# Patient Record
Sex: Female | Born: 1985 | Hispanic: No | Marital: Married | Smoking: Former smoker
Health system: Southern US, Community
[De-identification: ages and names within clinical notes are randomized; demographics above are authoritative.]

## PROBLEM LIST (undated history)

## (undated) DIAGNOSIS — G4700 Insomnia, unspecified: Secondary | ICD-10-CM

## (undated) DIAGNOSIS — F419 Anxiety disorder, unspecified: Secondary | ICD-10-CM

## (undated) DIAGNOSIS — F319 Bipolar disorder, unspecified: Secondary | ICD-10-CM

## (undated) DIAGNOSIS — L732 Hidradenitis suppurativa: Secondary | ICD-10-CM

## (undated) DIAGNOSIS — Z8659 Personal history of other mental and behavioral disorders: Secondary | ICD-10-CM

## (undated) DIAGNOSIS — F32A Depression, unspecified: Secondary | ICD-10-CM

## (undated) DIAGNOSIS — R110 Nausea: Secondary | ICD-10-CM

## (undated) DIAGNOSIS — N83209 Unspecified ovarian cyst, unspecified side: Secondary | ICD-10-CM

## (undated) DIAGNOSIS — F410 Panic disorder [episodic paroxysmal anxiety] without agoraphobia: Secondary | ICD-10-CM

## (undated) DIAGNOSIS — L0291 Cutaneous abscess, unspecified: Secondary | ICD-10-CM

## (undated) DIAGNOSIS — T7840XA Allergy, unspecified, initial encounter: Secondary | ICD-10-CM

## (undated) DIAGNOSIS — F329 Major depressive disorder, single episode, unspecified: Secondary | ICD-10-CM

## (undated) DIAGNOSIS — Z973 Presence of spectacles and contact lenses: Secondary | ICD-10-CM

## (undated) DIAGNOSIS — F17210 Nicotine dependence, cigarettes, uncomplicated: Secondary | ICD-10-CM

## (undated) DIAGNOSIS — D649 Anemia, unspecified: Secondary | ICD-10-CM

## (undated) DIAGNOSIS — Z872 Personal history of diseases of the skin and subcutaneous tissue: Secondary | ICD-10-CM

## (undated) HISTORY — PX: FOOT SURGERY: SHX648

## (undated) HISTORY — DX: Anemia, unspecified: D64.9

## (undated) HISTORY — DX: Allergy, unspecified, initial encounter: T78.40XA

## (undated) HISTORY — DX: Depression, unspecified: F32.A

## (undated) HISTORY — DX: Nicotine dependence, cigarettes, uncomplicated: F17.210

## (undated) HISTORY — PX: WISDOM TOOTH EXTRACTION: SHX21

## (undated) HISTORY — DX: Insomnia, unspecified: G47.00

## (undated) HISTORY — DX: Anxiety disorder, unspecified: F41.9

## (undated) HISTORY — PX: CHOLECYSTECTOMY: SHX55

## (undated) HISTORY — DX: Major depressive disorder, single episode, unspecified: F32.9

---
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
## 2007-03-20 HISTORY — PX: TUBAL LIGATION: SHX77

## 2008-05-30 ENCOUNTER — Emergency Department (HOSPITAL_COMMUNITY): Admission: EM | Admit: 2008-05-30 | Discharge: 2008-05-30 | Payer: Self-pay | Admitting: Emergency Medicine

## 2008-07-19 DIAGNOSIS — L259 Unspecified contact dermatitis, unspecified cause: Secondary | ICD-10-CM | POA: Insufficient documentation

## 2008-07-19 DIAGNOSIS — B009 Herpesviral infection, unspecified: Secondary | ICD-10-CM | POA: Insufficient documentation

## 2008-07-19 HISTORY — DX: Herpesviral infection, unspecified: B00.9

## 2009-02-04 DIAGNOSIS — N644 Mastodynia: Secondary | ICD-10-CM | POA: Insufficient documentation

## 2009-02-04 DIAGNOSIS — N62 Hypertrophy of breast: Secondary | ICD-10-CM | POA: Insufficient documentation

## 2009-03-19 ENCOUNTER — Emergency Department (HOSPITAL_COMMUNITY): Admission: EM | Admit: 2009-03-19 | Discharge: 2009-03-19 | Payer: Self-pay | Admitting: Emergency Medicine

## 2009-07-26 ENCOUNTER — Emergency Department (HOSPITAL_COMMUNITY): Admission: EM | Admit: 2009-07-26 | Discharge: 2009-07-27 | Payer: Self-pay | Admitting: Emergency Medicine

## 2009-08-26 ENCOUNTER — Emergency Department (HOSPITAL_COMMUNITY): Admission: EM | Admit: 2009-08-26 | Discharge: 2009-08-26 | Payer: Self-pay | Admitting: Emergency Medicine

## 2009-11-22 ENCOUNTER — Emergency Department (HOSPITAL_COMMUNITY): Admission: EM | Admit: 2009-11-22 | Discharge: 2009-11-22 | Payer: Self-pay | Admitting: Emergency Medicine

## 2010-01-05 DIAGNOSIS — E559 Vitamin D deficiency, unspecified: Secondary | ICD-10-CM | POA: Insufficient documentation

## 2010-05-15 LAB — URINALYSIS, ROUTINE W REFLEX MICROSCOPIC
Bilirubin Urine: NEGATIVE
Nitrite: POSITIVE — AB
Specific Gravity, Urine: 1.026 (ref 1.005–1.030)
Urobilinogen, UA: 1 mg/dL (ref 0.0–1.0)
pH: 5.5 (ref 5.0–8.0)

## 2010-05-15 LAB — URINE MICROSCOPIC-ADD ON

## 2010-05-15 LAB — POCT PREGNANCY, URINE: Preg Test, Ur: NEGATIVE

## 2010-05-19 LAB — WET PREP, GENITAL: Trich, Wet Prep: NONE SEEN

## 2010-05-19 LAB — URINE MICROSCOPIC-ADD ON

## 2010-05-19 LAB — URINALYSIS, ROUTINE W REFLEX MICROSCOPIC
Bilirubin Urine: NEGATIVE
Glucose, UA: NEGATIVE mg/dL
Ketones, ur: NEGATIVE mg/dL
Nitrite: NEGATIVE
Specific Gravity, Urine: 1.025 (ref 1.005–1.030)
pH: 5.5 (ref 5.0–8.0)

## 2010-05-19 LAB — GC/CHLAMYDIA PROBE AMP, GENITAL
Chlamydia, DNA Probe: NEGATIVE
GC Probe Amp, Genital: NEGATIVE

## 2010-07-08 ENCOUNTER — Emergency Department (HOSPITAL_COMMUNITY)
Admission: EM | Admit: 2010-07-08 | Discharge: 2010-07-08 | Discharge status: Against Medical Advice | Payer: Medicaid Other | Source: Home / Self Care | Attending: Emergency Medicine | Admitting: Emergency Medicine

## 2010-07-08 ENCOUNTER — Emergency Department (HOSPITAL_COMMUNITY)
Admission: EM | Admit: 2010-07-08 | Discharge: 2010-07-08 | Disposition: A | Discharge status: Home / Self Care | Payer: Medicaid Other | Attending: Emergency Medicine | Admitting: Emergency Medicine

## 2010-07-08 ENCOUNTER — Emergency Department (HOSPITAL_COMMUNITY): Payer: Medicaid Other

## 2010-07-08 DIAGNOSIS — M79609 Pain in unspecified limb: Secondary | ICD-10-CM | POA: Insufficient documentation

## 2010-07-08 DIAGNOSIS — M7989 Other specified soft tissue disorders: Secondary | ICD-10-CM | POA: Insufficient documentation

## 2010-07-08 DIAGNOSIS — Y990 Civilian activity done for income or pay: Secondary | ICD-10-CM | POA: Insufficient documentation

## 2010-07-08 DIAGNOSIS — W19XXXA Unspecified fall, initial encounter: Secondary | ICD-10-CM | POA: Insufficient documentation

## 2010-07-08 DIAGNOSIS — S59909A Unspecified injury of unspecified elbow, initial encounter: Secondary | ICD-10-CM | POA: Insufficient documentation

## 2010-07-08 DIAGNOSIS — S59919A Unspecified injury of unspecified forearm, initial encounter: Secondary | ICD-10-CM | POA: Insufficient documentation

## 2010-07-08 DIAGNOSIS — S6990XA Unspecified injury of unspecified wrist, hand and finger(s), initial encounter: Secondary | ICD-10-CM | POA: Insufficient documentation

## 2011-05-25 ENCOUNTER — Encounter (HOSPITAL_COMMUNITY): Payer: Self-pay | Admitting: Emergency Medicine

## 2011-05-25 ENCOUNTER — Emergency Department (HOSPITAL_COMMUNITY)
Admission: EM | Admit: 2011-05-25 | Discharge: 2011-05-25 | Disposition: A | Discharge status: Home / Self Care | Payer: Medicaid Other | Attending: Emergency Medicine | Admitting: Emergency Medicine

## 2011-05-25 DIAGNOSIS — R109 Unspecified abdominal pain: Secondary | ICD-10-CM | POA: Insufficient documentation

## 2011-05-25 DIAGNOSIS — A088 Other specified intestinal infections: Secondary | ICD-10-CM | POA: Insufficient documentation

## 2011-05-25 DIAGNOSIS — A084 Viral intestinal infection, unspecified: Secondary | ICD-10-CM

## 2011-05-25 DIAGNOSIS — R197 Diarrhea, unspecified: Secondary | ICD-10-CM | POA: Insufficient documentation

## 2011-05-25 DIAGNOSIS — Z79899 Other long term (current) drug therapy: Secondary | ICD-10-CM | POA: Insufficient documentation

## 2011-05-25 DIAGNOSIS — R5381 Other malaise: Secondary | ICD-10-CM | POA: Insufficient documentation

## 2011-05-25 DIAGNOSIS — R509 Fever, unspecified: Secondary | ICD-10-CM | POA: Insufficient documentation

## 2011-05-25 DIAGNOSIS — J45909 Unspecified asthma, uncomplicated: Secondary | ICD-10-CM | POA: Insufficient documentation

## 2011-05-25 DIAGNOSIS — R112 Nausea with vomiting, unspecified: Secondary | ICD-10-CM | POA: Insufficient documentation

## 2011-05-25 LAB — COMPREHENSIVE METABOLIC PANEL
Albumin: 4.6 g/dL (ref 3.5–5.2)
CO2: 22 mEq/L (ref 19–32)
Calcium: 10 mg/dL (ref 8.4–10.5)
Chloride: 102 mEq/L (ref 96–112)
GFR calc Af Amer: >90 mL/min (ref >90)
Glucose, Bld: 92 mg/dL (ref 70–99)
Sodium: 137 mEq/L (ref 135–145)
Total Protein: 8.2 g/dL (ref 6.0–8.3)

## 2011-05-25 LAB — DIFFERENTIAL
Basophils Relative: 0 % (ref 0–1)
Eosinophils Absolute: 0 10*3/uL (ref 0.0–0.7)
Eosinophils Relative: 0 % (ref 0–5)
Monocytes Absolute: 0.4 10*3/uL (ref 0.1–1.0)
Neutro Abs: 13.9 10*3/uL — ABNORMAL HIGH (ref 1.7–7.7)
Neutrophils Relative %: 95 % — ABNORMAL HIGH (ref 43–77)

## 2011-05-25 LAB — URINALYSIS, ROUTINE W REFLEX MICROSCOPIC
Leukocytes, UA: NEGATIVE
Nitrite: NEGATIVE
Protein, ur: NEGATIVE mg/dL
Urobilinogen, UA: 1 mg/dL (ref 0.0–1.0)

## 2011-05-25 LAB — CBC
HCT: 44.6 % (ref 36.0–46.0)
Hemoglobin: 15.3 g/dL — ABNORMAL HIGH (ref 12.0–15.0)
MCH: 31 pg (ref 26.0–34.0)
MCV: 90.3 fL (ref 78.0–100.0)
RBC: 4.94 MIL/uL (ref 3.87–5.11)
WBC: 14.7 10*3/uL — ABNORMAL HIGH (ref 4.0–10.5)

## 2011-05-25 LAB — GLUCOSE, CAPILLARY: Glucose-Capillary: 82 mg/dL (ref 70–99)

## 2011-05-25 LAB — URINE MICROSCOPIC-ADD ON

## 2011-05-25 LAB — LIPASE, BLOOD: Lipase: 22 U/L (ref 11–59)

## 2011-05-25 MED ORDER — PROMETHAZINE HCL 25 MG PO TABS: 25.0000 mg | ORAL_TABLET | Freq: Four times a day (QID) | ORAL | Status: DC | PRN

## 2011-05-25 MED ORDER — MORPHINE SULFATE 4 MG/ML IJ SOLN
4.0000 mg | Freq: Once | INTRAMUSCULAR | Status: AC
  Administered 2011-05-25: 4 mg via INTRAVENOUS
  Filled 2011-05-25: qty 1

## 2011-05-25 MED ORDER — SODIUM CHLORIDE 0.9 % IV BOLUS (SEPSIS): 1000.0000 mL | Freq: Once | INTRAVENOUS | Status: DC

## 2011-05-25 MED ORDER — ONDANSETRON HCL 4 MG/2ML IJ SOLN
8.0000 mg | Freq: Once | INTRAMUSCULAR | Status: AC
  Administered 2011-05-25: 4 mg via INTRAVENOUS
  Filled 2011-05-25: qty 2

## 2011-05-25 MED ORDER — METOCLOPRAMIDE HCL 5 MG/ML IJ SOLN
10.0000 mg | Freq: Once | INTRAMUSCULAR | Status: AC
  Administered 2011-05-25: 10 mg via INTRAVENOUS
  Filled 2011-05-25: qty 2

## 2011-05-25 MED ORDER — LOPERAMIDE HCL 2 MG PO CAPS: 2.0000 mg | ORAL_CAPSULE | Freq: Four times a day (QID) | ORAL | Status: AC | PRN

## 2011-05-25 NOTE — ED Notes (Signed)
Pt in by [facility] c/o of nausea, vomiting, diarrhea, chills, abdominal pain, headache x 2 days. Pt unable to keep down food. CBG 68. BP 110/70, HR 72.

## 2011-05-25 NOTE — Discharge Instructions (Signed)
Viral Gastroenteritis Viral gastroenteritis is also known as stomach flu. This condition affects the stomach and intestinal tract. It can cause sudden diarrhea and vomiting. The illness typically lasts 3 to 8 days. Most people develop an immune response that eventually gets rid of the virus. While this natural response develops, the virus can make you quite ill. CAUSES  Many different viruses can cause gastroenteritis, such as rotavirus or noroviruses. You can catch one of these viruses by consuming contaminated food or water. You may also catch a virus by sharing utensils or other personal items with an infected person or by touching a contaminated surface. SYMPTOMS  The most common symptoms are diarrhea and vomiting. These problems can cause a severe loss of body fluids (dehydration) and a body salt (electrolyte) imbalance. Other symptoms may include:  Fever.   Headache.   Fatigue.   Abdominal pain.  DIAGNOSIS  Your caregiver can usually diagnose viral gastroenteritis based on your symptoms and a physical exam. A stool sample may also be taken to test for the presence of viruses or other infections. TREATMENT  This illness typically goes away on its own. Treatments are aimed at rehydration. The most serious cases of viral gastroenteritis involve vomiting so severely that you are not able to keep fluids down. In these cases, fluids must be given through an intravenous line (IV). HOME CARE INSTRUCTIONS   Drink enough fluids to keep your urine clear or pale yellow. Drink small amounts of fluids frequently and increase the amounts as tolerated.   Ask your caregiver for specific rehydration instructions.   Avoid:   Foods high in sugar.   Alcohol.   Carbonated drinks.   Tobacco.   Juice.   Caffeine drinks.   Extremely hot or cold fluids.   Fatty, greasy foods.   Too much intake of anything at one time.   Dairy products until 24 to 48 hours after diarrhea stops.   You may  consume probiotics. Probiotics are active cultures of beneficial bacteria. They may lessen the amount and number of diarrheal stools in adults. Probiotics can be found in yogurt with active cultures and in supplements.   Wash your hands well to avoid spreading the virus.   Only take over-the-counter or prescription medicines for pain, discomfort, or fever as directed by your caregiver. Do not give aspirin to children. Antidiarrheal medicines are not recommended.   Ask your caregiver if you should continue to take your regular prescribed and over-the-counter medicines.   Keep all follow-up appointments as directed by your caregiver.  SEEK IMMEDIATE MEDICAL CARE IF:   You are unable to keep fluids down.   You do not urinate at least once every 6 to 8 hours.   You develop shortness of breath.   You notice blood in your stool or vomit. This may look like coffee grounds.   You have abdominal pain that increases or is concentrated in one small area (localized).   You have persistent vomiting or diarrhea.   You have a fever.   The patient is a child younger than 3 months, and he or she has a fever.   The patient is a child older than 3 months, and he or she has a fever and persistent symptoms.   The patient is a child older than 3 months, and he or she has a fever and symptoms suddenly get worse.   The patient is a baby, and he or she has no tears when crying.  MAKE SURE YOU:     Understand these instructions.   Will watch your condition.   Will get help right away if you are not doing well or get worse.  Document Released: 02/16/2005 Document Revised: 02/05/2011 Document Reviewed: 12/03/2010 [employer] Patient Information 2012 [employer]. 

## 2011-05-25 NOTE — ED Provider Notes (Signed)
History     CSN: [id number]  Arrival date & time 05/25/11  1004   First MD Initiated Contact with Patient 05/25/11 1019      Chief Complaint  Patient presents with  . Nausea  . Emesis    (Consider location/radiation/quality/duration/timing/severity/associated sxs/prior treatment) Patient is a 26 y.o. female presenting with vomiting. The history is provided by the patient. No language interpreter was used.  Emesis  This is a new problem. The current episode started 2 days ago. The problem occurs 5 to 10 times per day. The problem has been gradually worsening. The emesis has an appearance of stomach contents. Maximum temperature: subjective. Associated symptoms include abdominal pain, chills (subjective), diarrhea and a fever (subjective). Pertinent negatives include no arthralgias, no cough, no headaches and no myalgias. Risk factors include ill contacts (her son has been ill with similar sx).    Past Medical History  Diagnosis Date  . Asthma   . Migraine     Past Surgical History  Procedure Date  . Tubal ligation   . Cesarean section     No family history on file.  History  Substance Use Topics  . Smoking status: Not on file  . Smokeless tobacco: Not on file  . Alcohol Use:     OB History    Grav Para Term Preterm Abortions TAB SAB Ect Mult Living                  Review of Systems  Constitutional: Positive for fever (subjective), chills (subjective), activity change, appetite change and fatigue.  HENT: Negative for congestion, sore throat, rhinorrhea, neck pain and neck stiffness.   Respiratory: Negative for cough and shortness of breath.   Cardiovascular: Negative for chest pain and palpitations.  Gastrointestinal: Positive for nausea, vomiting, abdominal pain and diarrhea. Negative for constipation and blood in stool.  Genitourinary: Negative for dysuria, urgency, frequency and flank pain.  Musculoskeletal: Negative for myalgias, back pain and arthralgias.    Neurological: Negative for dizziness, weakness, light-headedness, numbness and headaches.  All other systems reviewed and are negative.    Allergies  Ampicillin and Latex  Home Medications   Current Outpatient Rx  Name Route Sig Dispense Refill  . BISMUTH SUBSALICYLATE 262 MG PO CHEW Oral Chew 524 mg by mouth as needed. For diarrhea    . NYQUIL COLD & FLU PO Oral Take 15 mLs by mouth every 12 (twelve) hours.     Marland Kitchen LOPERAMIDE HCL 2 MG PO CAPS Oral Take 1 capsule (2 mg total) by mouth 4 (four) times daily as needed for diarrhea or loose stools. 12 capsule 0  . PROMETHAZINE HCL 25 MG PO TABS Oral Take 1 tablet (25 mg total) by mouth every 6 (six) hours as needed for nausea. 20 tablet 0    BP 111/64  Pulse 85  Temp(Src) 98.7 F (37.1 C) (Oral)  Resp 18  SpO2 100%  Physical Exam  Nursing note and vitals reviewed. Constitutional: She is oriented to person, place, and time. She appears well-developed and well-nourished.  HENT:  Head: Normocephalic and atraumatic.  Mouth/Throat: Oropharynx is clear and moist.  Eyes: Conjunctivae and EOM are normal. Pupils are equal, round, and reactive to light.  Neck: Normal range of motion. Neck supple.  Cardiovascular: Normal rate, regular rhythm, normal heart sounds and intact distal pulses.  Exam reveals no gallop and no friction rub.   No murmur heard. Pulmonary/Chest: Effort normal and breath sounds normal. No respiratory distress. She exhibits no tenderness.  Abdominal: Soft. Bowel sounds are normal. There is tenderness. There is no rebound and no guarding.  Musculoskeletal: Normal range of motion. She exhibits no edema and no tenderness.  Neurological: She is alert and oriented to person, place, and time. No cranial nerve deficit.  Skin: Skin is warm and dry.    ED Course  Procedures (including critical care time)  Labs Reviewed  CBC - Abnormal; Notable for the following:    WBC 14.7 (*)    Hemoglobin 15.3 (*)    All other  components within normal limits  DIFFERENTIAL - Abnormal; Notable for the following:    Neutrophils Relative 95 (*)    Neutro Abs 13.9 (*)    Lymphocytes Relative 3 (*)    Lymphs Abs 0.4 (*)    All other components within normal limits  URINALYSIS, ROUTINE W REFLEX MICROSCOPIC - Abnormal; Notable for the following:    APPearance CLOUDY (*)    Hgb urine dipstick TRACE (*)    Ketones, ur TRACE (*)    All other components within normal limits  URINE MICROSCOPIC-ADD ON - Abnormal; Notable for the following:    Squamous Epithelial / LPF FEW (*)    Bacteria, UA FEW (*)    All other components within normal limits  COMPREHENSIVE METABOLIC PANEL  LIPASE, BLOOD  GLUCOSE, CAPILLARY   No results found.   1. Viral gastroenteritis       MDM  Viral gastroenteritis. The patient has some evidence of dehydration on arrival. She received Zofran, Reglan for anti-emesis. She received a dose of morphine for pain control. After 2 L of fluid the patient states she's feeling much better. She tolerated oral intake one emergency department. Will be discharged home with antiemetics and instructed to follow up with her primary care physician. I encouraged her to continue aggressive oral hydration at home. Was provided clear signs and symptoms for which to return        Dayton Bailiff, MD 05/25/11 [phone]

## 2011-05-25 NOTE — ED Notes (Signed)
CBG 82 

## 2011-05-25 NOTE — ED Notes (Signed)
Pt. Stated unable to urinate  

## 2012-03-25 ENCOUNTER — Encounter (HOSPITAL_COMMUNITY): Payer: Self-pay | Admitting: *Deleted

## 2012-03-25 ENCOUNTER — Emergency Department (HOSPITAL_COMMUNITY)
Admission: EM | Admit: 2012-03-25 | Discharge: 2012-03-25 | Disposition: A | Discharge status: Home / Self Care | Payer: Medicaid Other | Attending: Emergency Medicine | Admitting: Emergency Medicine

## 2012-03-25 DIAGNOSIS — Z8679 Personal history of other diseases of the circulatory system: Secondary | ICD-10-CM | POA: Insufficient documentation

## 2012-03-25 DIAGNOSIS — R51 Headache: Secondary | ICD-10-CM

## 2012-03-25 DIAGNOSIS — F17200 Nicotine dependence, unspecified, uncomplicated: Secondary | ICD-10-CM | POA: Insufficient documentation

## 2012-03-25 DIAGNOSIS — K529 Noninfective gastroenteritis and colitis, unspecified: Secondary | ICD-10-CM

## 2012-03-25 DIAGNOSIS — J45909 Unspecified asthma, uncomplicated: Secondary | ICD-10-CM | POA: Insufficient documentation

## 2012-03-25 DIAGNOSIS — K5289 Other specified noninfective gastroenteritis and colitis: Secondary | ICD-10-CM | POA: Insufficient documentation

## 2012-03-25 DIAGNOSIS — Z79899 Other long term (current) drug therapy: Secondary | ICD-10-CM | POA: Insufficient documentation

## 2012-03-25 MED ORDER — METOCLOPRAMIDE HCL 5 MG/ML IJ SOLN
10.0000 mg | Freq: Once | INTRAMUSCULAR | Status: AC
  Administered 2012-03-25: 10 mg via INTRAVENOUS
  Filled 2012-03-25: qty 2

## 2012-03-25 MED ORDER — SODIUM CHLORIDE 0.9 % IV BOLUS (SEPSIS)
1000.0000 mL | Freq: Once | INTRAVENOUS | Status: AC
  Administered 2012-03-25: 1000 mL via INTRAVENOUS

## 2012-03-25 MED ORDER — PROMETHAZINE HCL 25 MG PO TABS
25.0000 mg | ORAL_TABLET | Freq: Four times a day (QID) | ORAL | Status: DC | PRN
Start: 2012-03-25 — End: 2013-02-13

## 2012-03-25 MED ORDER — ONDANSETRON HCL 4 MG/2ML IJ SOLN
4.0000 mg | Freq: Once | INTRAMUSCULAR | Status: AC
  Administered 2012-03-25: 4 mg via INTRAVENOUS
  Filled 2012-03-25: qty 2

## 2012-03-25 MED ORDER — OXYCODONE-ACETAMINOPHEN 5-325 MG PO TABS: 2.0000 | ORAL_TABLET | ORAL | Status: DC | PRN

## 2012-03-25 MED ORDER — KETOROLAC TROMETHAMINE 30 MG/ML IJ SOLN
30.0000 mg | Freq: Once | INTRAMUSCULAR | Status: AC
  Administered 2012-03-25: 30 mg via INTRAVENOUS
  Filled 2012-03-25: qty 1

## 2012-03-25 NOTE — ED Provider Notes (Addendum)
History     CSN: [id number]  Arrival date & time 03/25/12  0821   First MD Initiated Contact with Patient 03/25/12 [phone]      Chief Complaint  Patient presents with  . Emesis  . Diarrhea    (Consider location/radiation/quality/duration/timing/severity/associated sxs/prior treatment) HPI.... nausea, vomiting, diarrhea since 1 AM with associated right temporal headache.  Minimal epigastric pain. No fever, chills, stiff neck. Patient has tried Aleve with minimal success.  Severity is mild to moderate  Past Medical History  Diagnosis Date  . Asthma   . Migraine     Past Surgical History  Procedure Date  . Tubal ligation   . Cesarean section     No family history on file.  History  Substance Use Topics  . Smoking status: Current Every Day Smoker  . Smokeless tobacco: Not on file  . Alcohol Use: No    OB History    Grav Para Term Preterm Abortions TAB SAB Ect Mult Living                  Review of Systems  All other systems reviewed and are negative.    Allergies  Ampicillin and Latex  Home Medications   Current Outpatient Rx  Name  Route  Sig  Dispense  Refill  . ALBUTEROL SULFATE HFA 108 (90 BASE) MCG/ACT IN AERS   Inhalation   Inhale 2 puffs into the lungs every 6 (six) hours as needed. For shortness of breath.         Marland Kitchen BISMUTH SUBSALICYLATE 262 MG PO CHEW   Oral   Chew 524 mg by mouth as needed. For diarrhea         . LOPERAMIDE HCL 2 MG PO CAPS   Oral   Take 2 mg by mouth 4 (four) times daily as needed. For diarrhea.         Ronney Asters SINUS MAX ST PO   Oral   Take 2 tablets by mouth every 6 (six) hours as needed. For headache.           BP 117/74  Pulse 86  Temp 98.5 F (36.9 C) (Oral)  Resp 24  Ht 5\' 1"  (1.549 m)  Wt 115 lb (52.164 kg)  BMI 21.73 kg/m2  SpO2 100%  LMP 03/24/2012  Physical Exam  Nursing note and vitals reviewed. Constitutional: She is oriented to person, place, and time. She appears well-developed and  well-nourished.  HENT:  Head: Normocephalic and atraumatic.  Eyes: Conjunctivae normal and EOM are normal. Pupils are equal, round, and reactive to light.  Neck: Normal range of motion. Neck supple.  Cardiovascular: Normal rate, regular rhythm and normal heart sounds.   Pulmonary/Chest: Effort normal and breath sounds normal.  Abdominal: Soft. Bowel sounds are normal.  Musculoskeletal: Normal range of motion.  Neurological: She is alert and oriented to person, place, and time.  Skin: Skin is warm and dry.  Psychiatric: She has a normal mood and affect.    ED Course  Procedures (including critical care time)  Labs Reviewed - No data to display No results found.   No diagnosis found.    MDM  No acute abdomen. Hydrate, IV Toradol, IV Reglan.    1145:  Feeling better.  No acute abdomen. Headache improved      Donnetta Hutching, MD 03/25/12 1021  Donnetta Hutching, MD 03/25/12 1201

## 2012-03-25 NOTE — ED Notes (Signed)
Pt reports onset of n/v/d at 0100 this morning. Reports epigastric pain since this morning. Reports taking Imodium, pepto bismol, and tylenol sinus without relief of symptoms.

## 2012-07-13 DIAGNOSIS — G43829 Menstrual migraine, not intractable, without status migrainosus: Secondary | ICD-10-CM | POA: Insufficient documentation

## 2012-07-13 DIAGNOSIS — J309 Allergic rhinitis, unspecified: Secondary | ICD-10-CM | POA: Insufficient documentation

## 2012-12-22 ENCOUNTER — Encounter: Payer: Self-pay | Admitting: Obstetrics

## 2013-01-09 ENCOUNTER — Ambulatory Visit: Payer: Medicaid Other | Admitting: Obstetrics

## 2013-01-13 ENCOUNTER — Ambulatory Visit: Payer: Medicaid Other | Admitting: Obstetrics

## 2013-02-06 ENCOUNTER — Encounter: Payer: Self-pay | Admitting: Obstetrics

## 2013-02-13 ENCOUNTER — Encounter: Payer: Self-pay | Admitting: Obstetrics

## 2013-02-13 ENCOUNTER — Ambulatory Visit (INDEPENDENT_AMBULATORY_CARE_PROVIDER_SITE_OTHER): Payer: Medicaid Other | Admitting: Obstetrics

## 2013-02-13 VITALS — BP 109/70 | HR 57 | Temp 98.4°F | Ht 61.0 in | Wt 140.0 lb

## 2013-02-13 DIAGNOSIS — N926 Irregular menstruation, unspecified: Secondary | ICD-10-CM

## 2013-02-13 DIAGNOSIS — N920 Excessive and frequent menstruation with regular cycle: Secondary | ICD-10-CM

## 2013-02-13 DIAGNOSIS — R82998 Other abnormal findings in urine: Secondary | ICD-10-CM

## 2013-02-13 DIAGNOSIS — Z3049 Encounter for surveillance of other contraceptives: Secondary | ICD-10-CM

## 2013-02-13 DIAGNOSIS — R8290 Unspecified abnormal findings in urine: Secondary | ICD-10-CM

## 2013-02-13 LAB — POCT URINALYSIS DIPSTICK
Bilirubin, UA: NEGATIVE
Glucose, UA: NEGATIVE
Nitrite, UA: POSITIVE
Urobilinogen, UA: NEGATIVE
pH, UA: 5

## 2013-02-13 MED ORDER — SULFAMETHOXAZOLE-TMP DS 800-160 MG PO TABS: 1.0000 | ORAL_TABLET | Freq: Two times a day (BID) | ORAL | Status: DC

## 2013-02-13 MED ORDER — NORETHINDRONE ACETATE 5 MG PO TABS: 10.0000 mg | ORAL_TABLET | Freq: Every day | ORAL | Status: DC

## 2013-02-13 NOTE — Progress Notes (Signed)
Subjective:     Diane Garrison is a 27 y.o. female here for a problem exam.  Current complaints: prolonged menstrual bleeding, has not had a depo shot since July, complains that bleeds even with depo shot  Personal health questionnaire reviewed: yes.   Gynecologic History Patient's last menstrual period was 01/27/2013. Contraception: Depo-Provera injections Last Pap:2013. Results were: normal Last mammogram: N/A  Obstetric History OB History  No data available     The following portions of the patient's history were reviewed and updated as appropriate: allergies, current medications, past family history, past medical history, past social history, past surgical history and problem list.  Review of Systems Pertinent items are noted in HPI.    Objective:    General appearance: alert and no distress Abdomen: normal findings: soft, non-tender Pelvic: cervix normal in appearance, external genitalia normal, no adnexal masses or tenderness, no cervical motion tenderness, rectovaginal septum normal, uterus normal size, shape, and consistency and vagina with malodorous pinkish discharge.    Assessment:    AUB on Depo.  BV  UTI   Plan:    Education reviewed: safe sex/STD prevention and contraceptive options. Contraception: none. Follow up in: 6 weeks. Aygestin Rx.   Bactrim DS Rx

## 2013-02-14 LAB — WET PREP BY MOLECULAR PROBE
Gardnerella vaginalis: POSITIVE — AB
Trichomonas vaginosis: NEGATIVE

## 2013-02-14 LAB — GC/CHLAMYDIA PROBE AMP: GC Probe RNA: NEGATIVE

## 2013-02-16 ENCOUNTER — Other Ambulatory Visit: Payer: Self-pay | Admitting: *Deleted

## 2013-02-16 DIAGNOSIS — N390 Urinary tract infection, site not specified: Secondary | ICD-10-CM

## 2013-02-16 DIAGNOSIS — B9689 Other specified bacterial agents as the cause of diseases classified elsewhere: Secondary | ICD-10-CM

## 2013-02-16 MED ORDER — METRONIDAZOLE 500 MG PO TABS: 500.0000 mg | ORAL_TABLET | Freq: Two times a day (BID) | ORAL | Status: DC

## 2013-02-16 MED ORDER — NITROFURANTOIN MONOHYD MACRO 100 MG PO CAPS: 100.0000 mg | ORAL_CAPSULE | Freq: Two times a day (BID) | ORAL | Status: DC

## 2013-03-27 ENCOUNTER — Ambulatory Visit: Payer: Medicaid Other | Admitting: Obstetrics

## 2013-03-30 ENCOUNTER — Ambulatory Visit: Payer: Medicaid Other | Admitting: Obstetrics

## 2013-03-31 ENCOUNTER — Ambulatory Visit (INDEPENDENT_AMBULATORY_CARE_PROVIDER_SITE_OTHER): Payer: Medicaid Other | Admitting: Obstetrics

## 2013-03-31 ENCOUNTER — Encounter: Payer: Self-pay | Admitting: Advanced Practice Midwife

## 2013-03-31 VITALS — BP 104/69 | HR 66 | Temp 98.4°F | Wt 139.0 lb

## 2013-03-31 DIAGNOSIS — N926 Irregular menstruation, unspecified: Secondary | ICD-10-CM

## 2013-03-31 DIAGNOSIS — N939 Abnormal uterine and vaginal bleeding, unspecified: Secondary | ICD-10-CM

## 2013-03-31 DIAGNOSIS — D649 Anemia, unspecified: Secondary | ICD-10-CM

## 2013-03-31 LAB — COMPREHENSIVE METABOLIC PANEL
ALK PHOS: 81 U/L (ref 39–117)
ALT: 16 U/L (ref 0–35)
AST: 16 U/L (ref 0–37)
Albumin: 4.4 g/dL (ref 3.5–5.2)
BILIRUBIN TOTAL: 0.5 mg/dL (ref 0.2–1.2)
BUN: 6 mg/dL (ref 6–23)
CO2: 24 meq/L (ref 19–32)
Calcium: 9.5 mg/dL (ref 8.4–10.5)
Chloride: 105 mEq/L (ref 96–112)
Creat: 0.56 mg/dL (ref 0.50–1.10)
GLUCOSE: 60 mg/dL — AB (ref 70–99)
Potassium: 4.2 mEq/L (ref 3.5–5.3)
Sodium: 137 mEq/L (ref 135–145)
Total Protein: 7.2 g/dL (ref 6.0–8.3)

## 2013-03-31 LAB — CBC WITH DIFFERENTIAL/PLATELET
BASOS PCT: 0 % (ref 0–1)
Basophils Absolute: 0 10*3/uL (ref 0.0–0.1)
EOS ABS: 0.3 10*3/uL (ref 0.0–0.7)
EOS PCT: 5 % (ref 0–5)
HEMATOCRIT: 43.3 % (ref 36.0–46.0)
HEMOGLOBIN: 14.6 g/dL (ref 12.0–15.0)
Lymphocytes Relative: 31 % (ref 12–46)
Lymphs Abs: 2.2 10*3/uL (ref 0.7–4.0)
MCH: 31 pg (ref 26.0–34.0)
MCHC: 33.7 g/dL (ref 30.0–36.0)
MCV: 91.9 fL (ref 78.0–100.0)
MONO ABS: 0.6 10*3/uL (ref 0.1–1.0)
MONOS PCT: 8 % (ref 3–12)
Neutro Abs: 3.9 10*3/uL (ref 1.7–7.7)
Neutrophils Relative %: 56 % (ref 43–77)
Platelets: 295 10*3/uL (ref 150–400)
RBC: 4.71 MIL/uL (ref 3.87–5.11)
RDW: 13.7 % (ref 11.5–15.5)
WBC: 7 10*3/uL (ref 4.0–10.5)

## 2013-03-31 LAB — TSH: TSH: 0.993 u[IU]/mL (ref 0.350–4.500)

## 2013-03-31 MED ORDER — ESTROGENS CONJUGATED 1.25 MG PO TABS: 1.2500 mg | ORAL_TABLET | Freq: Three times a day (TID) | ORAL | Status: DC

## 2013-03-31 NOTE — Progress Notes (Signed)
Pt is in office today for f/u from abnormal bleeding.  Pt saw Dr Jodi Mourning in December and completed treatment as reccommended.  Pt states that she is still bleeding currently. Pt states that she has continual everyday bleeding.  Pt states that she has occasional pain with bleeding.

## 2013-04-03 LAB — URINE CULTURE

## 2013-04-13 ENCOUNTER — Other Ambulatory Visit: Payer: Self-pay | Admitting: *Deleted

## 2013-04-13 DIAGNOSIS — N390 Urinary tract infection, site not specified: Secondary | ICD-10-CM

## 2013-04-13 MED ORDER — NITROFURANTOIN MONOHYD MACRO 100 MG PO CAPS: 100.0000 mg | ORAL_CAPSULE | Freq: Two times a day (BID) | ORAL | Status: DC

## 2013-05-12 ENCOUNTER — Ambulatory Visit: Payer: Medicaid Other | Admitting: Advanced Practice Midwife

## 2013-06-08 ENCOUNTER — Ambulatory Visit (INDEPENDENT_AMBULATORY_CARE_PROVIDER_SITE_OTHER): Payer: Medicaid Other | Admitting: Obstetrics

## 2013-06-08 ENCOUNTER — Encounter: Payer: Self-pay | Admitting: Advanced Practice Midwife

## 2013-06-08 VITALS — BP 115/74 | HR 76 | Temp 98.5°F | Ht 61.0 in

## 2013-06-08 DIAGNOSIS — N926 Irregular menstruation, unspecified: Secondary | ICD-10-CM

## 2013-06-08 DIAGNOSIS — N939 Abnormal uterine and vaginal bleeding, unspecified: Principal | ICD-10-CM

## 2013-06-08 MED ORDER — KETOROLAC TROMETHAMINE 60 MG/2ML IM SOLN
60.0000 mg | Freq: Once | INTRAMUSCULAR | Status: AC
  Administered 2013-06-08: 60 mg via INTRAMUSCULAR

## 2013-06-08 MED ORDER — OXYCODONE HCL 10 MG PO TABS: 10.0000 mg | ORAL_TABLET | Freq: Four times a day (QID) | ORAL | Status: DC | PRN

## 2013-06-08 MED ORDER — NAPROXEN SODIUM 550 MG PO TABS: 550.0000 mg | ORAL_TABLET | Freq: Two times a day (BID) | ORAL | Status: DC

## 2013-06-08 NOTE — Progress Notes (Signed)
Subjective:     Diane Garrison is a 28 y.o. female here for a routine exam.  Current complaints:Patient is in the office today for a follow up visit for her cycles. Patient states she bleeds almost every day. Patient states as of yesterday it has been a year since she has been bleeding. Patient states none of the medication she has taken has helped. Patient states when she was on the Depo Injections they did not help either. Patient states it is to the point where she has to wear a panty liner every day.  Patient states that usually by the end of the day she has to put on a pad. Patient states she has cramping all day every day. Patient states she gets a sharp, scratching, pinching pelvic pain. Patient states she gets nausea but never vomits.  Patient states she is taking zoloft but doesn't know the dosage.   The HPI was reviewed and explored in further detail by the provider. Gynecologic History Patient is unsure of LMP because she bleeds almost every day.  Contraception: Tubal Ligation Last Pap: 2014. Results were: normal  Obstetric History OB History  No data available     The following portions of the patient's history were reviewed and updated as appropriate: allergies, current medications, past family history, past medical history, past social history, past surgical history and problem list.  Review of Systems A comprehensive review of systems was negative except for: Genitourinary: positive for abnormal menstrual periods    Objective:    General appearance: alert and no distress Abdomen: normal findings: soft, non-tender Pelvic: cervix normal in appearance, external genitalia normal, no adnexal masses or tenderness, no cervical motion tenderness, rectovaginal septum normal, uterus normal size, shape, and consistency and vagina normal without discharge      Assessment:    AUB.  Heavy, frequent periods.   Plan:    Education reviewed: Management of  AUB.Marland Kitchen Contraception: tubal ligation. Follow up in: several weeks. Patient considering endometrial ablation   Ultrasound ordered.

## 2013-06-15 ENCOUNTER — Ambulatory Visit (HOSPITAL_COMMUNITY)
Admission: RE | Admit: 2013-06-15 | Discharge: 2013-06-15 | Disposition: A | Discharge status: Home / Self Care | Payer: Medicaid Other | Source: Ambulatory Visit | Attending: Obstetrics | Admitting: Obstetrics

## 2013-06-15 DIAGNOSIS — N949 Unspecified condition associated with female genital organs and menstrual cycle: Secondary | ICD-10-CM | POA: Insufficient documentation

## 2013-06-15 DIAGNOSIS — N939 Abnormal uterine and vaginal bleeding, unspecified: Secondary | ICD-10-CM

## 2013-06-15 DIAGNOSIS — N926 Irregular menstruation, unspecified: Secondary | ICD-10-CM

## 2013-06-15 DIAGNOSIS — N938 Other specified abnormal uterine and vaginal bleeding: Secondary | ICD-10-CM | POA: Insufficient documentation

## 2013-06-15 DIAGNOSIS — N925 Other specified irregular menstruation: Secondary | ICD-10-CM | POA: Insufficient documentation

## 2013-06-20 ENCOUNTER — Encounter: Payer: Self-pay | Admitting: Obstetrics

## 2013-06-22 ENCOUNTER — Ambulatory Visit (INDEPENDENT_AMBULATORY_CARE_PROVIDER_SITE_OTHER): Payer: Medicaid Other | Admitting: Obstetrics

## 2013-06-22 VITALS — BP 114/74 | HR 80 | Temp 98.2°F | Wt 147.0 lb

## 2013-06-22 DIAGNOSIS — N939 Abnormal uterine and vaginal bleeding, unspecified: Secondary | ICD-10-CM

## 2013-06-22 DIAGNOSIS — N926 Irregular menstruation, unspecified: Secondary | ICD-10-CM

## 2013-06-22 DIAGNOSIS — Z3009 Encounter for other general counseling and advice on contraception: Secondary | ICD-10-CM

## 2013-06-22 MED ORDER — NORETHINDRONE ACETATE 5 MG PO TABS: 10.0000 mg | ORAL_TABLET | Freq: Every day | ORAL | Status: DC

## 2013-06-23 ENCOUNTER — Encounter: Payer: Self-pay | Admitting: Obstetrics

## 2013-06-23 NOTE — Progress Notes (Signed)
Patient ID: Diane Garrison, female   DOB: [birth year], 28 y.o.   MRN: [id number]  Chief Complaint  Patient presents with  . Follow-up    HPI Diane Garrison is a 28 y.o. female.  Presents for follow up of ultrasound done for AUB.  She has heavy, painful periods.  Does not desire future fertility ( Has tubal ligation ) and desires surgical management.   HPI  Past Medical History  Diagnosis Date  . Asthma   . Migraine   . Anxiety   . Depression   . Insomnia   . Allergy   . Anemia   . Cigarette smoker     Past Surgical History  Procedure Laterality Date  . Tubal ligation    . Cesarean section    . Wisdom tooth extraction    . Cholecystectomy      Family History  Problem Relation Age of Onset  . Heart disease Mother   . Heart disease Maternal Grandmother     Social History History  Substance Use Topics  . Smoking status: Current Some Day Smoker  . Smokeless tobacco: Never Used  . Alcohol Use: No    Allergies  Allergen Reactions  . Coconut Flavor   . Ampicillin Rash  . Latex Rash    Current Outpatient Prescriptions  Medication Sig Dispense Refill  . albuterol (PROVENTIL HFA;VENTOLIN HFA) 108 (90 BASE) MCG/ACT inhaler Inhale 2 puffs into the lungs every 6 (six) hours as needed. For shortness of breath.      . ARIPiprazole (ABILIFY) 20 MG tablet Take 20 mg by mouth daily.      Marland Kitchen loratadine-pseudoephedrine (CLARITIN-D 24-HOUR) 10-240 MG per 24 hr tablet Take 1 tablet by mouth daily.      . naproxen sodium (ANAPROX DS) 550 MG tablet Take 1 tablet (550 mg total) by mouth 2 (two) times daily with a meal.  60 tablet  5  . norethindrone (AYGESTIN) 5 MG tablet Take 2 tablets (10 mg total) by mouth daily.  60 tablet  0  . Oxycodone HCl 10 MG TABS Take 1 tablet (10 mg total) by mouth every 6 (six) hours as needed.  40 tablet  0  . SUMAtriptan (IMITREX) 100 MG tablet Take 100 mg by mouth 2 (two) times daily as needed for migraine or headache. May repeat  in 2 hours if headache persists or recurs.      . traZODone (DESYREL) 100 MG tablet Take 100 mg by mouth at bedtime.       No current facility-administered medications for this visit.    Review of Systems Review of Systems Constitutional: negative for fatigue and weight loss Respiratory: negative for cough and wheezing Cardiovascular: negative for chest pain, fatigue and palpitations Gastrointestinal: negative for abdominal pain and change in bowel habits Genitourinary:  Heavy, painful periods Integument/breast: negative for nipple discharge Musculoskeletal:negative for myalgias Neurological: negative for gait problems and tremors Behavioral/Psych: negative for abusive relationship, depression Endocrine: negative for temperature intolerance     Blood pressure 114/74, pulse 80, temperature 98.2 F (36.8 C), weight 147 lb (66.679 kg).  Physical Exam Physical Exam General:   alert  Skin:   no rash or abnormalities  Lungs:   clear to auscultation bilaterally  Heart:   regular rate and rhythm, S1, S2 normal, no murmur, click, rub or gallop  Breasts:   normal without suspicious masses, skin or nipple changes or axillary nodes  Abdomen:  normal findings: no organomegaly, soft, non-tender and no hernia  Pelvis:  External genitalia: normal general appearance Urinary system: urethral meatus normal and bladder without fullness, nontender Vaginal: normal without tenderness, induration or masses Cervix: normal appearance Adnexa: normal bimanual exam Uterus: anteverted and non-tender, normal size   Data Reviewed Ultrasound is WNL's.  Negative for fibroids or polyps.   Assessment    AUB.  Desires surgical management.    Plan      Need to obtain previous records Possible management options include:  Endometrial Ablation recommended and agreed.  Will thin endometrium with Aygestin for 30 days prior to ablation Follow up in 3 weeks for pre op H&P.  Aygestin 10 mg po daily x 30 days.         Shelly Bombard 06/23/2013, 12:42 AM

## 2013-07-13 ENCOUNTER — Ambulatory Visit (INDEPENDENT_AMBULATORY_CARE_PROVIDER_SITE_OTHER): Payer: Medicaid Other | Admitting: Obstetrics

## 2013-07-13 ENCOUNTER — Encounter: Payer: Self-pay | Admitting: Obstetrics

## 2013-07-13 VITALS — BP 119/78 | HR 59 | Temp 98.9°F | Ht 61.0 in | Wt 158.0 lb

## 2013-07-13 DIAGNOSIS — N926 Irregular menstruation, unspecified: Secondary | ICD-10-CM

## 2013-07-13 DIAGNOSIS — N939 Abnormal uterine and vaginal bleeding, unspecified: Secondary | ICD-10-CM

## 2013-07-13 NOTE — Progress Notes (Signed)
Patient ID: Diane Garrison, female   DOB: Feb 15, 1986, 28 y.o.   MRN: [id number]  Chief Complaint  Patient presents with  . Follow-up    Pre-Op     HPI Diane Garrison is a 28 y.o. female.    H/O heavy periods, unresponsive to medical therapy.  Does not desire future fertility.  Endometrial Ablation offered and agreed to.  HPI  Past Medical History  Diagnosis Date  . Asthma   . Migraine   . Anxiety   . Depression   . Insomnia   . Allergy   . Anemia   . Cigarette smoker     Past Surgical History  Procedure Laterality Date  . Tubal ligation    . Cesarean section    . Wisdom tooth extraction    . Cholecystectomy      Family History  Problem Relation Age of Onset  . Heart disease Mother   . Heart disease Maternal Grandmother     Social History History  Substance Use Topics  . Smoking status: Current Some Day Smoker  . Smokeless tobacco: Never Used  . Alcohol Use: No    Allergies  Allergen Reactions  . Coconut Flavor   . Ampicillin Rash  . Latex Rash    Current Outpatient Prescriptions  Medication Sig Dispense Refill  . albuterol (PROVENTIL HFA;VENTOLIN HFA) 108 (90 BASE) MCG/ACT inhaler Inhale 2 puffs into the lungs every 6 (six) hours as needed. For shortness of breath.      . ARIPiprazole (ABILIFY) 20 MG tablet Take 20 mg by mouth daily.      . naproxen sodium (ANAPROX DS) 550 MG tablet Take 1 tablet (550 mg total) by mouth 2 (two) times daily with a meal.  60 tablet  5  . SUMAtriptan (IMITREX) 100 MG tablet Take 100 mg by mouth 2 (two) times daily as needed for migraine or headache. May repeat in 2 hours if headache persists or recurs.      . traZODone (DESYREL) 100 MG tablet Take 100 mg by mouth at bedtime.      Marland Kitchen loratadine-pseudoephedrine (CLARITIN-D 24-HOUR) 10-240 MG per 24 hr tablet Take 1 tablet by mouth daily.      . norethindrone (AYGESTIN) 5 MG tablet Take 2 tablets (10 mg total) by mouth daily.  60 tablet  0  . Oxycodone HCl  10 MG TABS Take 1 tablet (10 mg total) by mouth every 6 (six) hours as needed.  40 tablet  0   No current facility-administered medications for this visit.    Review of Systems Review of Systems Constitutional: negative for fatigue and weight loss Respiratory: negative for cough and wheezing Cardiovascular: negative for chest pain, fatigue and palpitations Gastrointestinal: negative for abdominal pain and change in bowel habits Genitourinary:negative Integument/breast: negative for nipple discharge Musculoskeletal:negative for myalgias Neurological: negative for gait problems and tremors Behavioral/Psych: negative for abusive relationship, depression Endocrine: negative for temperature intolerance     Blood pressure 119/78, pulse 59, temperature 98.9 F (37.2 C), height 5\' 1"  (1.549 m), weight 158 lb (71.668 kg).  Physical Exam Physical Exam General:   alert  Skin:   no rash or abnormalities  Lungs:   clear to auscultation bilaterally  Heart:   regular rate and rhythm, S1, S2 normal, no murmur, click, rub or gallop  Breasts:   normal without suspicious masses, skin or nipple changes or axillary nodes  Abdomen:  normal findings: no organomegaly, soft, non-tender and no hernia  Pelvis:  External genitalia: normal general appearance Urinary system: urethral meatus normal and bladder without fullness, nontender Vaginal: normal without tenderness, induration or masses Cervix: normal appearance Adnexa: normal bimanual exam Uterus: anteverted and non-tender, normal size    100% of 10 min visit spent on counseling and coordination of care.   Data Reviewed Labs:  CBC Ultrasound:  WNL's  Assessment    AUB with heavy and painful periods.  Desires Endometrial Ablation.     Plan    Hysteroscopy, D&C and HTA All questions answered. No orders of the defined types were placed in this encounter.   No orders of the defined types were placed in this encounter.      Shelly Bombard 07/13/2013, 2:18 PM

## 2014-02-13 ENCOUNTER — Other Ambulatory Visit: Payer: Self-pay | Admitting: *Deleted

## 2014-02-13 ENCOUNTER — Other Ambulatory Visit: Payer: Self-pay | Admitting: Obstetrics

## 2014-02-13 DIAGNOSIS — N939 Abnormal uterine and vaginal bleeding, unspecified: Secondary | ICD-10-CM

## 2014-02-13 MED ORDER — MEDROXYPROGESTERONE ACETATE 10 MG PO TABS: 10.0000 mg | ORAL_TABLET | Freq: Every day | ORAL | Status: DC

## 2014-03-08 NOTE — Patient Instructions (Addendum)
   Your procedure is scheduled on:  Friday, Jan 15  Enter through the Main Entrance of [facility] at: 6 AM Pick up the phone at the desk and dial [phone] and inform us of your arrival.  Please call this number if you have any problems the morning of surgery: [phone]  Remember: Do not eat or drink after midnight: Thursday Take these medicines the morning of surgery with a SIP OF WATER: abilify.  Bring albuterol inhaler with you on day of surgery.  Do not wear jewelry, make-up, or FINGER nail polish No metal in your hair or on your body. Do not wear lotions, powders, perfumes.  You may wear deodorant.  Do not bring valuables to the hospital. Contacts, dentures or bridgework may not be worn into surgery.  Patients discharged on the day of surgery will not be allowed to drive home.  Home with Diane Garrison cell [phone]

## 2014-03-09 ENCOUNTER — Encounter (HOSPITAL_COMMUNITY)
Admission: RE | Admit: 2014-03-09 | Discharge: 2014-03-09 | Disposition: A | Discharge status: Home / Self Care | Payer: Medicaid Other | Source: Ambulatory Visit | Attending: Obstetrics | Admitting: Obstetrics

## 2014-03-09 ENCOUNTER — Encounter (HOSPITAL_COMMUNITY): Payer: Self-pay

## 2014-03-09 DIAGNOSIS — Z01812 Encounter for preprocedural laboratory examination: Secondary | ICD-10-CM | POA: Insufficient documentation

## 2014-03-09 HISTORY — DX: Bipolar disorder, unspecified: F31.9

## 2014-03-09 LAB — CBC
HCT: 42.2 % (ref 36.0–46.0)
Hemoglobin: 14.2 g/dL (ref 12.0–15.0)
MCH: 31.3 pg (ref 26.0–34.0)
MCHC: 33.6 g/dL (ref 30.0–36.0)
MCV: 93 fL (ref 78.0–100.0)
Platelets: 266 10*3/uL (ref 150–400)
RBC: 4.54 MIL/uL (ref 3.87–5.11)
RDW: 13 % (ref 11.5–15.5)
WBC: 7.5 10*3/uL (ref 4.0–10.5)

## 2014-03-15 ENCOUNTER — Encounter (HOSPITAL_COMMUNITY): Payer: Self-pay | Admitting: Anesthesiology

## 2014-03-15 NOTE — Anesthesia Preprocedure Evaluation (Addendum)
Anesthesia Evaluation  Patient identified by MRN, date of birth, ID band Patient awake    Reviewed: Allergy & Precautions, NPO status , Patient's Chart, lab work & pertinent test results  Airway Mallampati: III  TM Distance: >3 FB Neck ROM: Full    Dental no notable dental hx. (+) Teeth Intact   Pulmonary asthma , Current Smoker,  breath sounds clear to auscultation  Pulmonary exam normal       Cardiovascular negative cardio ROS  Rhythm:Regular Rate:Normal     Neuro/Psych  Headaches, PSYCHIATRIC DISORDERS Anxiety Depression Bipolar Disorder    GI/Hepatic negative GI ROS, Neg liver ROS,   Endo/Other  Obesity  Renal/GU negative Renal ROS  negative genitourinary   Musculoskeletal negative musculoskeletal ROS (+)   Abdominal (+) + obese,   Peds  Hematology  (+) anemia ,   Anesthesia Other Findings   Reproductive/Obstetrics AUB                            Anesthesia Physical Anesthesia Plan  ASA: II  Anesthesia Plan: General   Post-op Pain Management:    Induction: Intravenous  Airway Management Planned: LMA  Additional Equipment:   Intra-op Plan:   Post-operative Plan: Extubation in OR  Informed Consent: I have reviewed the patients History and Physical, chart, labs and discussed the procedure including the risks, benefits and alternatives for the proposed anesthesia with the patient or authorized representative who has indicated his/her understanding and acceptance.   Dental advisory given  Plan Discussed with: CRNA, Anesthesiologist and Surgeon  Anesthesia Plan Comments:         Anesthesia Quick Evaluation

## 2014-03-16 ENCOUNTER — Ambulatory Visit (HOSPITAL_COMMUNITY): Payer: Medicaid Other | Admitting: Anesthesiology

## 2014-03-16 ENCOUNTER — Encounter (HOSPITAL_COMMUNITY)
Admission: RE | Disposition: A | Discharge status: Home / Self Care | Payer: Self-pay | Source: Ambulatory Visit | Attending: Obstetrics

## 2014-03-16 ENCOUNTER — Other Ambulatory Visit: Payer: Self-pay | Admitting: Obstetrics

## 2014-03-16 ENCOUNTER — Ambulatory Visit (HOSPITAL_COMMUNITY)
Admission: RE | Admit: 2014-03-16 | Discharge: 2014-03-16 | Disposition: A | Discharge status: Home / Self Care | Payer: Medicaid Other | Source: Ambulatory Visit | Attending: Obstetrics | Admitting: Obstetrics

## 2014-03-16 DIAGNOSIS — Z6830 Body mass index (BMI) 30.0-30.9, adult: Secondary | ICD-10-CM | POA: Diagnosis not present

## 2014-03-16 DIAGNOSIS — F329 Major depressive disorder, single episode, unspecified: Secondary | ICD-10-CM | POA: Insufficient documentation

## 2014-03-16 DIAGNOSIS — G4700 Insomnia, unspecified: Secondary | ICD-10-CM | POA: Insufficient documentation

## 2014-03-16 DIAGNOSIS — G43909 Migraine, unspecified, not intractable, without status migrainosus: Secondary | ICD-10-CM | POA: Insufficient documentation

## 2014-03-16 DIAGNOSIS — F17210 Nicotine dependence, cigarettes, uncomplicated: Secondary | ICD-10-CM | POA: Insufficient documentation

## 2014-03-16 DIAGNOSIS — Z91040 Latex allergy status: Secondary | ICD-10-CM | POA: Diagnosis not present

## 2014-03-16 DIAGNOSIS — N938 Other specified abnormal uterine and vaginal bleeding: Secondary | ICD-10-CM | POA: Insufficient documentation

## 2014-03-16 DIAGNOSIS — F419 Anxiety disorder, unspecified: Secondary | ICD-10-CM | POA: Diagnosis not present

## 2014-03-16 DIAGNOSIS — Z91018 Allergy to other foods: Secondary | ICD-10-CM | POA: Insufficient documentation

## 2014-03-16 DIAGNOSIS — E669 Obesity, unspecified: Secondary | ICD-10-CM | POA: Diagnosis not present

## 2014-03-16 DIAGNOSIS — J45909 Unspecified asthma, uncomplicated: Secondary | ICD-10-CM | POA: Insufficient documentation

## 2014-03-16 DIAGNOSIS — N920 Excessive and frequent menstruation with regular cycle: Secondary | ICD-10-CM

## 2014-03-16 DIAGNOSIS — R52 Pain, unspecified: Secondary | ICD-10-CM

## 2014-03-16 DIAGNOSIS — Z881 Allergy status to other antibiotic agents status: Secondary | ICD-10-CM | POA: Diagnosis not present

## 2014-03-16 DIAGNOSIS — F319 Bipolar disorder, unspecified: Secondary | ICD-10-CM | POA: Insufficient documentation

## 2014-03-16 HISTORY — PX: DILITATION & CURRETTAGE/HYSTROSCOPY WITH HYDROTHERMAL ABLATION: SHX5570

## 2014-03-16 LAB — PREGNANCY, URINE: Preg Test, Ur: NEGATIVE

## 2014-03-16 SURGERY — DILATATION & CURETTAGE/HYSTEROSCOPY WITH HYDROTHERMAL ABLATION
Anesthesia: General | Site: Vagina

## 2014-03-16 MED ORDER — KETOROLAC TROMETHAMINE 30 MG/ML IJ SOLN
INTRAMUSCULAR | Status: AC
  Administered 2014-03-16: 30 mg via INTRAMUSCULAR
  Filled 2014-03-16: qty 1

## 2014-03-16 MED ORDER — KETOROLAC TROMETHAMINE 30 MG/ML IJ SOLN
30.0000 mg | Freq: Once | INTRAMUSCULAR | Status: AC
  Administered 2014-03-16: 30 mg via INTRAMUSCULAR

## 2014-03-16 MED ORDER — PROPOFOL 10 MG/ML IV BOLUS
INTRAVENOUS | Status: DC | PRN
  Administered 2014-03-16: 150 mg via INTRAVENOUS

## 2014-03-16 MED ORDER — LIDOCAINE HCL 1 % IJ SOLN
INTRAMUSCULAR | Status: AC
Start: 2014-03-16 — End: 2014-03-16
  Filled 2014-03-16: qty 20

## 2014-03-16 MED ORDER — MEPERIDINE HCL 25 MG/ML IJ SOLN: 6.2500 mg | INTRAMUSCULAR | Status: DC | PRN

## 2014-03-16 MED ORDER — OXYCODONE-ACETAMINOPHEN 5-325 MG PO TABS: 1.0000 | ORAL_TABLET | ORAL | Status: DC | PRN

## 2014-03-16 MED ORDER — ONDANSETRON HCL 4 MG/2ML IJ SOLN
INTRAMUSCULAR | Status: AC
  Filled 2014-03-16: qty 2

## 2014-03-16 MED ORDER — FENTANYL CITRATE 0.05 MG/ML IJ SOLN
INTRAMUSCULAR | Status: DC | PRN
  Administered 2014-03-16 (×2): 25 ug via INTRAVENOUS
  Administered 2014-03-16: 50 ug via INTRAVENOUS

## 2014-03-16 MED ORDER — LIDOCAINE HCL (CARDIAC) 20 MG/ML IV SOLN
INTRAVENOUS | Status: AC
  Filled 2014-03-16: qty 5

## 2014-03-16 MED ORDER — FENTANYL CITRATE 0.05 MG/ML IJ SOLN
25.0000 ug | INTRAMUSCULAR | Status: DC | PRN
  Administered 2014-03-16 (×4): 50 ug via INTRAVENOUS

## 2014-03-16 MED ORDER — FENTANYL CITRATE 0.05 MG/ML IJ SOLN
INTRAMUSCULAR | Status: AC
  Administered 2014-03-16: 50 ug via INTRAVENOUS
  Filled 2014-03-16: qty 2

## 2014-03-16 MED ORDER — IBUPROFEN 800 MG PO TABS: 800.0000 mg | ORAL_TABLET | Freq: Three times a day (TID) | ORAL | Status: DC | PRN

## 2014-03-16 MED ORDER — KETOROLAC TROMETHAMINE 30 MG/ML IJ SOLN
INTRAMUSCULAR | Status: AC
  Filled 2014-03-16: qty 1

## 2014-03-16 MED ORDER — METOCLOPRAMIDE HCL 5 MG/ML IJ SOLN: 10.0000 mg | Freq: Once | INTRAMUSCULAR | Status: DC | PRN

## 2014-03-16 MED ORDER — DEXAMETHASONE SODIUM PHOSPHATE 4 MG/ML IJ SOLN
INTRAMUSCULAR | Status: DC | PRN
  Administered 2014-03-16: 4 mg via INTRAVENOUS

## 2014-03-16 MED ORDER — FENTANYL CITRATE 0.05 MG/ML IJ SOLN
INTRAMUSCULAR | Status: AC
  Filled 2014-03-16: qty 5

## 2014-03-16 MED ORDER — LIDOCAINE HCL (CARDIAC) 20 MG/ML IV SOLN
INTRAVENOUS | Status: DC | PRN
  Administered 2014-03-16: 80 mg via INTRAVENOUS

## 2014-03-16 MED ORDER — OXYCODONE HCL 5 MG/5ML PO SOLN: 5.0000 mg | Freq: Once | ORAL | Status: AC | PRN

## 2014-03-16 MED ORDER — FENTANYL CITRATE 0.05 MG/ML IJ SOLN: 25.0000 ug | INTRAMUSCULAR | Status: DC | PRN

## 2014-03-16 MED ORDER — OXYCODONE HCL 5 MG PO TABS
ORAL_TABLET | ORAL | Status: AC
  Filled 2014-03-16: qty 1

## 2014-03-16 MED ORDER — MIDAZOLAM HCL 2 MG/2ML IJ SOLN
INTRAMUSCULAR | Status: DC | PRN
  Administered 2014-03-16: 2 mg via INTRAVENOUS

## 2014-03-16 MED ORDER — SODIUM CHLORIDE BACTERIOSTATIC 0.9 % IJ SOLN
INTRAMUSCULAR | Status: DC | PRN
  Administered 2014-03-16: 3000 mL via VAGINAL

## 2014-03-16 MED ORDER — DEXAMETHASONE SODIUM PHOSPHATE 4 MG/ML IJ SOLN
INTRAMUSCULAR | Status: AC
  Filled 2014-03-16: qty 1

## 2014-03-16 MED ORDER — KETOROLAC TROMETHAMINE 30 MG/ML IJ SOLN
INTRAMUSCULAR | Status: DC | PRN
  Administered 2014-03-16: 30 mg via INTRAVENOUS

## 2014-03-16 MED ORDER — OXYCODONE HCL 5 MG PO TABS
5.0000 mg | ORAL_TABLET | Freq: Once | ORAL | Status: AC | PRN
  Administered 2014-03-16: 5 mg via ORAL

## 2014-03-16 MED ORDER — LACTATED RINGERS IV SOLN
INTRAVENOUS | Status: DC
  Administered 2014-03-16 (×2): via INTRAVENOUS

## 2014-03-16 MED ORDER — LIDOCAINE HCL 1 % IJ SOLN
INTRAMUSCULAR | Status: DC | PRN
  Administered 2014-03-16: 20 mL

## 2014-03-16 MED ORDER — ONDANSETRON HCL 4 MG/2ML IJ SOLN
INTRAMUSCULAR | Status: DC | PRN
Start: 2014-03-16 — End: 2014-03-16
  Administered 2014-03-16: 4 mg via INTRAVENOUS

## 2014-03-16 MED ORDER — MIDAZOLAM HCL 2 MG/2ML IJ SOLN
INTRAMUSCULAR | Status: AC
  Filled 2014-03-16: qty 2

## 2014-03-16 MED ORDER — PROPOFOL 10 MG/ML IV BOLUS
INTRAVENOUS | Status: AC
  Filled 2014-03-16: qty 20

## 2014-03-16 MED ORDER — DEXAMETHASONE SODIUM PHOSPHATE 10 MG/ML IJ SOLN
INTRAMUSCULAR | Status: AC
  Filled 2014-03-16: qty 1

## 2014-03-16 MED ORDER — FENTANYL CITRATE 0.05 MG/ML IJ SOLN
INTRAMUSCULAR | Status: AC
Start: 2014-03-16 — End: 2014-03-16
  Administered 2014-03-16: 50 ug via INTRAVENOUS
  Filled 2014-03-16: qty 2

## 2014-03-16 MED ORDER — SCOPOLAMINE 1 MG/3DAYS TD PT72: 1.0000 | MEDICATED_PATCH | Freq: Once | TRANSDERMAL | Status: DC

## 2014-03-16 SURGICAL SUPPLY — 13 items
CATH ROBINSON RED A/P 16FR (CATHETERS) ×2 IMPLANT
CLOTH BEACON ORANGE TIMEOUT ST (SAFETY) ×2 IMPLANT
CONTAINER PREFILL 10% NBF 60ML (FORM) ×2 IMPLANT
ELECT REM PT RETURN 9FT ADLT (ELECTROSURGICAL) ×2
ELECTRODE REM PT RTRN 9FT ADLT (ELECTROSURGICAL) ×1 IMPLANT
GLOVE BIO SURGEON STRL SZ8 (GLOVE) ×2 IMPLANT
GOWN STRL REUS W/TWL LRG LVL3 (GOWN DISPOSABLE) ×6 IMPLANT
NS IRRIG 1000ML POUR BTL (IV SOLUTION) ×2 IMPLANT
PACK VAGINAL MINOR WOMEN LF (CUSTOM PROCEDURE TRAY) ×2 IMPLANT
PAD OB MATERNITY 4.3X12.25 (PERSONAL CARE ITEMS) ×2 IMPLANT
SET GENESYS HTA PROCERVA (MISCELLANEOUS) ×2 IMPLANT
TOWEL OR 17X24 6PK STRL BLUE (TOWEL DISPOSABLE) ×4 IMPLANT
WATER STERILE IRR 1000ML POUR (IV SOLUTION) ×2 IMPLANT

## 2014-03-16 NOTE — Anesthesia Postprocedure Evaluation (Signed)
  Anesthesia Post-op Note  Patient: Diane Garrison  Procedure(s) Performed: Procedure(s): DILATATION & CURETTAGE/HYSTEROSCOPY WITH HYDROTHERMAL ABLATION (N/A)  Patient Location: PACU  Anesthesia Type:General  Level of Consciousness: awake, alert  and oriented  Airway and Oxygen Therapy: Patient Spontanous Breathing  Post-op Pain: mild  Post-op Assessment: Post-op Vital signs reviewed, Patient's Cardiovascular Status Stable, Respiratory Function Stable, Patent Airway, No signs of Nausea or vomiting and Pain level controlled  Post-op Vital Signs: Reviewed and stable  Last Vitals:  Filed Vitals:   03/16/14 0900  BP: 128/71  Pulse: 96  Temp:   Resp: 20    Complications: No apparent anesthesia complications

## 2014-03-16 NOTE — Transfer of Care (Signed)
Immediate Anesthesia Transfer of Care Note  Patient: Diane Garrison  Procedure(s) Performed: Procedure(s): DILATATION & CURETTAGE/HYSTEROSCOPY WITH HYDROTHERMAL ABLATION (N/A)  Patient Location: PACU  Anesthesia Type:General  Level of Consciousness: awake, alert  and oriented  Airway & Oxygen Therapy: Patient Spontanous Breathing and Patient connected to nasal cannula oxygen  Post-op Assessment: Report given to PACU RN and Post -op Vital signs reviewed and stable  Post vital signs: Reviewed and stable  Complications: No apparent anesthesia complications

## 2014-03-16 NOTE — Op Note (Signed)
Preoperative diagnosis: dysfunctional uterine bleeding  Postoperative diagnosis: Same  Procedure: Diagnostic hysteroscopy, dilatation and curettage, HTA Endometrial Ablation  Surgeon:  Shelly Bombard.  Anesthesia:general  Estimated blood loss: Negligible  Urine output: 43ml  IV Fluids: 1117BV  Complications: None  Specimen: Endometrial curettings to Pathology  Operative Findings: Normal uterine cavity  Description of procedure:   The patient was taken to the operating room and placed on the operating table in the semi-lithotomy position in [place].  Examination under anesthesia was performed.  The patient was prepped and draped in the usual manner.  After a time-out had been completed, a speculum was placed in the vagina.  The anterior lip of the cervix was grasped with a single-toothed tenaculum.    10 cc of 1% lidocaine were injected at 4 and 7 o'clock to produce a paracervical block.  The uterine cavity sounded to 8 cm.  The endocervical canal was dilated with Kennon Rounds dilators.  The HTA instrument then inserted with LR as the distending medium was used to perform a diagnostic hysteroscopy.  The findings are noted above.   A small, Sims curette was used to perform an endometrial curettage.  HTA endometrial ablation then performed in routine fashion All the instruments were removed from the vagina.  Final instrument counts were correct.  The patient was taken to the PACU in stable condition.

## 2014-03-16 NOTE — H&P (Signed)
Diane Garrison is an 29 y.o. female. H/O `heavy and painful periods.  Pertinent Gynecological History: Menses: flow is moderate Bleeding: AUB Contraception: tubal ligation DES exposure: denies Blood transfusions: none Sexually transmitted diseases: no past history Previous GYN Procedures: BTL  Last mammogram: n/a Date: n/a Last pap: normal Date: 2015    Menstrual History: Menarche age: 29  No LMP recorded.    Past Medical History  Diagnosis Date  . Migraine   . Anxiety   . Depression   . Insomnia   . Allergy   . Anemia   . Cigarette smoker   . Asthma   . Bipolar disorder     Bipolar 2    Past Surgical History  Procedure Laterality Date  . Tubal ligation    . Cesarean section  2004,2006,2007,2008    x 4  . Wisdom tooth extraction    . Cholecystectomy      Family History  Problem Relation Age of Onset  . Heart disease Mother   . Heart disease Maternal Grandmother     Social History:  reports that she has been smoking Cigarettes.  She has a 1.5 pack-year smoking history. She has never used smokeless tobacco. She reports that she does not drink alcohol or use illicit drugs.  Allergies:  Allergies  Allergen Reactions  . Coconut Flavor   . Ampicillin Rash  . Latex Rash    Prescriptions prior to admission  Medication Sig Dispense Refill Last Dose  . albuterol (PROVENTIL HFA;VENTOLIN HFA) 108 (90 BASE) MCG/ACT inhaler Inhale 2 puffs into the lungs every 6 (six) hours as needed. For shortness of breath.   Taking  . ARIPiprazole (ABILIFY) 20 MG tablet Take 20 mg by mouth daily.   03/16/2014 at 0500  . beclomethasone (QVAR) 40 MCG/ACT inhaler Inhale 1 puff into the lungs daily.     Marland Kitchen loratadine-pseudoephedrine (CLARITIN-D 24-HOUR) 10-240 MG per 24 hr tablet Take 1 tablet by mouth daily.   Taking  . medroxyPROGESTERone (PROVERA) 10 MG tablet Take 1 tablet (10 mg total) by mouth daily. 30 tablet 0   . SUMAtriptan (IMITREX) 100 MG tablet Take 100 mg by mouth  2 (two) times daily as needed for migraine or headache. May repeat in 2 hours if headache persists or recurs.   Taking  . traZODone (DESYREL) 100 MG tablet Take 100 mg by mouth at bedtime.   Taking  . triamcinolone cream (KENALOG) 0.1 % Apply 1 application topically as needed.     . naproxen sodium (ANAPROX DS) 550 MG tablet Take 1 tablet (550 mg total) by mouth 2 (two) times daily with a meal. (Patient not taking: Reported on 03/06/2014) 60 tablet 5 Taking  . norethindrone (AYGESTIN) 5 MG tablet Take 2 tablets (10 mg total) by mouth daily. (Patient not taking: Reported on 03/06/2014) 60 tablet 0 Not Taking  . Oxycodone HCl 10 MG TABS Take 1 tablet (10 mg total) by mouth every 6 (six) hours as needed. (Patient not taking: Reported on 03/06/2014) 40 tablet 0 Not Taking    ROS  Blood pressure 104/66, pulse 62, temperature 98.8 F (37.1 C), temperature source Oral, resp. rate 20, SpO2 100 %. Physical Exam  Results for orders placed or performed during the hospital encounter of 03/16/14 (from the past 24 hour(s))  Pregnancy, urine     Status: None   Collection Time: 03/16/14  6:00 AM  Result Value Ref Range   Preg Test, Ur NEGATIVE NEGATIVE    No results found.  Assessment/Plan: AUB.  Hysteroscopy, D&C and HTA planned.  HARPER,CHARLES A 03/16/2014, 7:25 AM

## 2014-03-16 NOTE — Addendum Note (Signed)
Addendum  created 03/16/14 0954 by Flossie Dibble, CRNA   Modules edited: Anesthesia Blocks and Procedures, Anesthesia LDA, Clinical Notes, Lines/Drains/Airways Properties Editor   Clinical Notes:  File: [id number]   Lines/Drains/Airways Properties Editor:  Properties of line/drain/airway/wound [REMOVED] Airway have been modified.

## 2014-03-16 NOTE — Anesthesia Procedure Notes (Signed)
Procedure Name: LMA Insertion Date/Time: 03/16/2014 7:37 AM Performed by: Flossie Dibble Pre-anesthesia Checklist: Emergency Drugs available, Patient identified, Timeout performed, Suction available and Patient being monitored Patient Re-evaluated:Patient Re-evaluated prior to inductionOxygen Delivery Method: Circle system utilized Preoxygenation: Pre-oxygenation with 100% oxygen Intubation Type: IV induction LMA: LMA inserted LMA Size: 4.0 Placement Confirmation: breath sounds checked- equal and bilateral and positive ETCO2 Tube secured with: Tape Dental Injury: Teeth and Oropharynx as per pre-operative assessment

## 2014-03-16 NOTE — Discharge Instructions (Signed)
DISCHARGE INSTRUCTIONS: HYSTEROSCOPY / ENDOMETRIAL ABLATION The following instructions have been prepared to help you care for yourself upon your return home.  May take Ibuprofen products after 2:11 p.m. As needed for cramps  Personal hygiene:  Use sanitary pads for vaginal drainage, not tampons.  Shower the day after your procedure.  NO tub baths, pools or Jacuzzis for 2-3 weeks.  Wipe front to back after using the bathroom.  Activity and limitations:  Do NOT drive or operate any equipment for 24 hours. The effects of anesthesia are still present and drowsiness may result.  Do NOT rest in bed all day.  Walking is encouraged.  Walk up and down stairs slowly.  You may resume your normal activity in one to two days or as indicated by your physician. Sexual activity: NO intercourse for at least 2 weeks after the procedure, or as indicated by your Doctor.  Diet: Eat a light meal as desired this evening. You may resume your usual diet tomorrow.  Return to Work: You may resume your work activities in one to two days or as indicated by Marine scientist.  What to expect after your surgery: Expect to have vaginal bleeding/discharge for 2-3 days and spotting for up to 10 days. It is not unusual to have soreness for up to 1-2 weeks. You may have a slight burning sensation when you urinate for the first day. Mild cramps may continue for a couple of days. You may have a regular period in 2-6 weeks.  Call your doctor for any of the following:  Excessive vaginal bleeding or clotting, saturating and changing one pad every hour.  Inability to urinate 6 hours after discharge from hospital.  Pain not relieved by pain medication.  Fever of 100.4 F or greater.  Unusual vaginal discharge or odor.  Return to office _________________Call for an appointment ___________________ Patients signature: ______________________ Nurses signature ________________________  [facility]  [phone]

## 2014-03-19 ENCOUNTER — Encounter (HOSPITAL_COMMUNITY): Payer: Self-pay | Admitting: Obstetrics

## 2014-03-22 ENCOUNTER — Other Ambulatory Visit: Payer: Self-pay | Admitting: Obstetrics

## 2014-03-22 DIAGNOSIS — G8918 Other acute postprocedural pain: Secondary | ICD-10-CM

## 2014-03-22 DIAGNOSIS — R112 Nausea with vomiting, unspecified: Secondary | ICD-10-CM

## 2014-03-22 MED ORDER — IBUPROFEN 800 MG PO TABS: 800.0000 mg | ORAL_TABLET | Freq: Three times a day (TID) | ORAL | Status: DC | PRN

## 2014-03-22 MED ORDER — PROMETHAZINE HCL 25 MG PO TABS: 25.0000 mg | ORAL_TABLET | Freq: Four times a day (QID) | ORAL | Status: DC | PRN

## 2014-04-11 ENCOUNTER — Encounter: Payer: Self-pay | Admitting: Obstetrics

## 2014-04-11 ENCOUNTER — Ambulatory Visit (INDEPENDENT_AMBULATORY_CARE_PROVIDER_SITE_OTHER): Payer: Medicaid Other | Admitting: Obstetrics

## 2014-04-11 VITALS — BP 120/69 | HR 78 | Temp 98.7°F | Ht 61.0 in | Wt 161.0 lb

## 2014-04-11 DIAGNOSIS — Z789 Other specified health status: Secondary | ICD-10-CM

## 2014-04-11 DIAGNOSIS — A499 Bacterial infection, unspecified: Secondary | ICD-10-CM

## 2014-04-11 DIAGNOSIS — N939 Abnormal uterine and vaginal bleeding, unspecified: Secondary | ICD-10-CM

## 2014-04-11 DIAGNOSIS — N760 Acute vaginitis: Secondary | ICD-10-CM

## 2014-04-11 DIAGNOSIS — B9689 Other specified bacterial agents as the cause of diseases classified elsewhere: Secondary | ICD-10-CM

## 2014-04-11 MED ORDER — CONCEPT OB 130-92.4-1 MG PO CAPS: 1.0000 | ORAL_CAPSULE | Freq: Every day | ORAL | Status: DC

## 2014-04-11 MED ORDER — CLINDAMYCIN HCL 300 MG PO CAPS: 300.0000 mg | ORAL_CAPSULE | Freq: Three times a day (TID) | ORAL | Status: DC

## 2014-04-11 NOTE — Progress Notes (Signed)
Subjective:        Diane Garrison is a 29 y.o. female here for a routine exam.  Current vaginal complaints: S/P HTA Endometrial Ablation.  Has irregular bleeding.  Personal health questionnaire:  Is patient Ashkenazi Jewish, have a family history of breast and/or ovarian cancer: no Is there a family history of uterine cancer diagnosed at age < 73, gastrointestinal cancer, urinary tract cancer, family member who is a Field seismologist syndrome-associated carrier: no Is the patient overweight and hypertensive, family history of diabetes, personal history of gestational diabetes, preeclampsia or PCOS: no Is patient over 57, have PCOS,  family history of premature CHD under age 36, diabetes, smoke, have hypertension or peripheral artery disease:  no At any time, has a partner hit, kicked or otherwise hurt or frightened you?: no Over the past 2 weeks, have you felt down, depressed or hopeless?: no Over the past 2 weeks, have you felt little interest or pleasure in doing things?:no   Obstetric History OB History  No data available    Past Medical History  Diagnosis Date  . Migraine   . Anxiety   . Depression   . Insomnia   . Allergy   . Anemia   . Cigarette smoker   . Asthma   . Bipolar disorder     Bipolar 2    Past Surgical History  Procedure Laterality Date  . Tubal ligation    . Cesarean section  2004,2006,2007,2008    x 4  . Wisdom tooth extraction    . Cholecystectomy    . Dilitation & currettage/hystroscopy with hydrothermal ablation N/A 03/16/2014    Procedure: DILATATION & CURETTAGE/HYSTEROSCOPY WITH HYDROTHERMAL ABLATION;  Surgeon: Shelly Bombard, MD;  Location: [facility];  Service: Gynecology;  Laterality: N/A;     Current outpatient prescriptions:  .  albuterol (PROVENTIL HFA;VENTOLIN HFA) 108 (90 BASE) MCG/ACT inhaler, Inhale 2 puffs into the lungs every 6 (six) hours as needed. For shortness of breath., Disp: , Rfl:  .  ARIPiprazole (ABILIFY) 20 MG tablet, Take  20 mg by mouth daily., Disp: , Rfl:  .  beclomethasone (QVAR) 40 MCG/ACT inhaler, Inhale 1 puff into the lungs daily., Disp: , Rfl:  .  loratadine-pseudoephedrine (CLARITIN-D 24-HOUR) 10-240 MG per 24 hr tablet, Take 1 tablet by mouth daily., Disp: , Rfl:  .  traZODone (DESYREL) 100 MG tablet, Take 100 mg by mouth at bedtime., Disp: , Rfl:  .  triamcinolone cream (KENALOG) 0.1 %, Apply 1 application topically as needed., Disp: , Rfl:  .  clindamycin (CLEOCIN) 300 MG capsule, Take 1 capsule (300 mg total) by mouth 3 (three) times daily., Disp: 30 capsule, Rfl: 1 .  ibuprofen (ADVIL,MOTRIN) 800 MG tablet, Take 1 tablet (800 mg total) by mouth every 8 (eight) hours as needed. (Patient not taking: Reported on 04/11/2014), Disp: 30 tablet, Rfl: 5 .  oxyCODONE-acetaminophen (ROXICET) 5-325 MG per tablet, Take 1-2 tablets by mouth every 4 (four) hours as needed for moderate pain or severe pain. (Patient not taking: Reported on 04/11/2014), Disp: 40 tablet, Rfl: 0 .  Prenat w/o A Vit-FeFum-FePo-FA (CONCEPT OB) 130-92.4-1 MG CAPS, Take 1 capsule by mouth daily., Disp: 30 capsule, Rfl: 11 Allergies  Allergen Reactions  . Coconut Flavor   . Ampicillin Rash  . Latex Rash    History  Substance Use Topics  . Smoking status: Current Some Day Smoker -- 0.50 packs/day for 3 years    Types: Cigarettes  . Smokeless tobacco: Never Used  .  Alcohol Use: No    Family History  Problem Relation Age of Onset  . Heart disease Mother   . Heart disease Maternal Grandmother       Review of Systems  Constitutional: negative for fatigue and weight loss Respiratory: negative for cough and wheezing Cardiovascular: negative for chest pain, fatigue and palpitations Gastrointestinal: negative for abdominal pain and change in bowel habits Musculoskeletal:negative for myalgias Neurological: negative for gait problems and tremors Behavioral/Psych: negative for abusive relationship, depression Endocrine: negative for  temperature intolerance   Genitourinary:negative for abnormal menstrual periods, genital lesions, hot flashes, sexual problems and vaginal discharge Integument/breast: negative for breast lump, breast tenderness, nipple discharge and skin lesion(s)    Objective:       BP 120/69 mmHg  Pulse 78  Temp(Src) 98.7 F (37.1 C)  Ht 5\' 1"  (1.549 m)  Wt 161 lb (73.029 kg)  BMI 30.44 kg/m2 General:   alert  Skin:   no rash or abnormalities  Lungs:   clear to auscultation bilaterally  Heart:   regular rate and rhythm, S1, S2 normal, no murmur, click, rub or gallop  Breasts:   normal without suspicious masses, skin or nipple changes or axillary nodes  Abdomen:  normal findings: no organomegaly, soft, non-tender and no hernia  Pelvis:  External genitalia: normal general appearance Urinary system: urethral meatus normal and bladder without fullness, nontender Vaginal: normal without tenderness, induration or masses Cervix: normal appearance Adnexa: normal bimanual exam Uterus: anteverted and non-tender, normal size    Assessment:    Doing well.  BV   Plan:   Clindamycin Rx   Education reviewed: Instructions after Ablation.. Follow up in: 6 weeks.   Meds ordered this encounter  Medications  . clindamycin (CLEOCIN) 300 MG capsule    Sig: Take 1 capsule (300 mg total) by mouth 3 (three) times daily.    Dispense:  30 capsule    Refill:  1  . Prenat w/o A Vit-FeFum-FePo-FA (CONCEPT OB) 130-92.4-1 MG CAPS    Sig: Take 1 capsule by mouth daily.    Dispense:  30 capsule    Refill:  11   Orders Placed This Encounter  Procedures  . SureSwab, Vaginosis/Vaginitis Plus

## 2014-04-16 ENCOUNTER — Other Ambulatory Visit: Payer: Self-pay | Admitting: Obstetrics

## 2014-04-16 DIAGNOSIS — A5901 Trichomonal vulvovaginitis: Secondary | ICD-10-CM

## 2014-04-16 LAB — SURESWAB, VAGINOSIS/VAGINITIS PLUS
Atopobium vaginae: 6.6 Log (cells/mL)
C. ALBICANS, DNA: NOT DETECTED
C. PARAPSILOSIS, DNA: NOT DETECTED
C. TRACHOMATIS RNA, TMA: NOT DETECTED
C. glabrata, DNA: NOT DETECTED
C. tropicalis, DNA: NOT DETECTED
GARDNERELLA VAGINALIS: 6.3 Log (cells/mL)
LACTOBACILLUS SPECIES: NOT DETECTED Log (cells/mL)
MEGASPHAERA SPECIES: 7.9 Log (cells/mL)
N. gonorrhoeae RNA, TMA: NOT DETECTED
T. VAGINALIS RNA, QL TMA: DETECTED — AB

## 2014-04-16 MED ORDER — TINIDAZOLE 500 MG PO TABS: 2.0000 g | ORAL_TABLET | Freq: Once | ORAL | Status: DC

## 2014-04-23 ENCOUNTER — Encounter: Payer: Self-pay | Admitting: *Deleted

## 2014-05-23 ENCOUNTER — Ambulatory Visit: Payer: Medicaid Other | Admitting: Obstetrics

## 2014-05-30 ENCOUNTER — Ambulatory Visit: Payer: Medicaid Other | Admitting: Obstetrics

## 2014-06-05 ENCOUNTER — Ambulatory Visit: Payer: Medicaid Other | Admitting: Obstetrics

## 2014-07-26 ENCOUNTER — Emergency Department (HOSPITAL_COMMUNITY)
Admission: EM | Admit: 2014-07-26 | Discharge: 2014-07-26 | Disposition: A | Discharge status: Home / Self Care | Payer: Medicaid Other | Attending: Emergency Medicine | Admitting: Emergency Medicine

## 2014-07-26 ENCOUNTER — Encounter (HOSPITAL_COMMUNITY): Payer: Self-pay | Admitting: Emergency Medicine

## 2014-07-26 DIAGNOSIS — Z792 Long term (current) use of antibiotics: Secondary | ICD-10-CM | POA: Insufficient documentation

## 2014-07-26 DIAGNOSIS — Z79899 Other long term (current) drug therapy: Secondary | ICD-10-CM | POA: Insufficient documentation

## 2014-07-26 DIAGNOSIS — R61 Generalized hyperhidrosis: Secondary | ICD-10-CM | POA: Diagnosis not present

## 2014-07-26 DIAGNOSIS — J45901 Unspecified asthma with (acute) exacerbation: Secondary | ICD-10-CM | POA: Insufficient documentation

## 2014-07-26 DIAGNOSIS — R0602 Shortness of breath: Secondary | ICD-10-CM | POA: Insufficient documentation

## 2014-07-26 DIAGNOSIS — G4700 Insomnia, unspecified: Secondary | ICD-10-CM | POA: Insufficient documentation

## 2014-07-26 DIAGNOSIS — Z91040 Latex allergy status: Secondary | ICD-10-CM | POA: Diagnosis not present

## 2014-07-26 DIAGNOSIS — D649 Anemia, unspecified: Secondary | ICD-10-CM | POA: Insufficient documentation

## 2014-07-26 DIAGNOSIS — Z720 Tobacco use: Secondary | ICD-10-CM | POA: Insufficient documentation

## 2014-07-26 DIAGNOSIS — F419 Anxiety disorder, unspecified: Secondary | ICD-10-CM | POA: Insufficient documentation

## 2014-07-26 DIAGNOSIS — R079 Chest pain, unspecified: Secondary | ICD-10-CM | POA: Insufficient documentation

## 2014-07-26 DIAGNOSIS — F3181 Bipolar II disorder: Secondary | ICD-10-CM | POA: Diagnosis not present

## 2014-07-26 DIAGNOSIS — R51 Headache: Secondary | ICD-10-CM | POA: Insufficient documentation

## 2014-07-26 LAB — TROPONIN I

## 2014-07-26 MED ORDER — HYDROCODONE-ACETAMINOPHEN 5-325 MG PO TABS
2.0000 | ORAL_TABLET | Freq: Once | ORAL | Status: AC
  Administered 2014-07-26: 2 via ORAL
  Filled 2014-07-26: qty 2

## 2014-07-26 NOTE — ED Provider Notes (Signed)
CSN: [id number]     Arrival date & time 07/26/14  1248 History  This chart was scribed for a non-physician practitioner, Ottie Glazier, working with Jola Schmidt, MD by Martinique Peace, ED Scribe. The patient was seen in [facility]. The patient's care was started at 1:15 PM.     Chief Complaint  Patient presents with  . Panic Attack  . Chest Pain  . Headache      Patient is a 29 y.o. female presenting with chest pain and headaches. The history is provided by the patient. No language interpreter was used.  Chest Pain Associated symptoms: headache and shortness of breath   Associated symptoms: no abdominal pain and no dizziness   Headache Associated symptoms: no abdominal pain and no dizziness   HPI Comments: Mellisa Garrison is a 29 y.o. female with a history of anxiety and depression who arrived via EMS presents to the Emergency Department complaining of episode that occurred earlier today around 11:00 AM while she was at work and reports she suddenly got really "hot" and broke out into sweats with SOB. She also complains of headache that has continued since incident. Her friend reports that before pt got out the car to walk into work, she stated she felt as if "her heart was beating deep down in her chest". She denies any chest pain, dizziness, abdominal pain, nausea, vomiting, diarrhea or urinary symptoms.  Pt is current smoker.  She denies being on birth control.    Past Medical History  Diagnosis Date  . Migraine   . Anxiety   . Depression   . Insomnia   . Allergy   . Anemia   . Cigarette smoker   . Asthma   . Bipolar disorder     Bipolar 2   Past Surgical History  Procedure Laterality Date  . Tubal ligation    . Cesarean section  2004,2006,2007,2008    x 4  . Wisdom tooth extraction    . Cholecystectomy    . Dilitation & currettage/hystroscopy with hydrothermal ablation N/A 03/16/2014    Procedure: DILATATION & CURETTAGE/HYSTEROSCOPY WITH HYDROTHERMAL ABLATION;   Surgeon: Shelly Bombard, MD;  Location: [facility];  Service: Gynecology;  Laterality: N/A;   Family History  Problem Relation Age of Onset  . Heart disease Mother   . Heart disease Maternal Grandmother    History  Substance Use Topics  . Smoking status: Current Some Day Smoker -- 0.50 packs/day for 3 years    Types: Cigarettes  . Smokeless tobacco: Never Used  . Alcohol Use: No   OB History    No data available     Review of Systems  Respiratory: Positive for shortness of breath.   Cardiovascular: Positive for chest pain.  Gastrointestinal: Negative for abdominal pain.  Genitourinary: Negative for dysuria, frequency and hematuria.  Neurological: Positive for headaches. Negative for dizziness.  All other systems reviewed and are negative.     Allergies  Coconut flavor; Ampicillin; and Latex  Home Medications   Prior to Admission medications   Medication Sig Start Date End Date Taking? Authorizing Provider  albuterol (PROVENTIL HFA;VENTOLIN HFA) 108 (90 BASE) MCG/ACT inhaler Inhale 2 puffs into the lungs every 6 (six) hours as needed. For shortness of breath.   Yes Historical Provider, MD  ARIPiprazole (ABILIFY) 20 MG tablet Take 20 mg by mouth daily.   Yes Historical Provider, MD  beclomethasone (QVAR) 40 MCG/ACT inhaler Inhale 1 puff into the lungs daily as needed (shortness of breath).  Yes Historical Provider, MD  loratadine-pseudoephedrine (CLARITIN-D 24-HOUR) 10-240 MG per 24 hr tablet Take 1 tablet by mouth daily as needed for allergies.    Yes Historical Provider, MD  traZODone (DESYREL) 100 MG tablet Take 100 mg by mouth at bedtime.   Yes Historical Provider, MD  triamcinolone cream (KENALOG) 0.1 % Apply 1 application topically as needed (rash/itching).    Yes Historical Provider, MD  clindamycin (CLEOCIN) 300 MG capsule Take 1 capsule (300 mg total) by mouth 3 (three) times daily. Patient not taking: Reported on 07/26/2014 04/11/14   Shelly Bombard, MD  ibuprofen  (ADVIL,MOTRIN) 800 MG tablet Take 1 tablet (800 mg total) by mouth every 8 (eight) hours as needed. Patient not taking: Reported on 04/11/2014 03/22/14   Shelly Bombard, MD  oxyCODONE-acetaminophen (ROXICET) 5-325 MG per tablet Take 1-2 tablets by mouth every 4 (four) hours as needed for moderate pain or severe pain. Patient not taking: Reported on 04/11/2014 03/16/14   Shelly Bombard, MD  Prenat w/o A Vit-FeFum-FePo-FA (CONCEPT OB) 130-92.4-1 MG CAPS Take 1 capsule by mouth daily. Patient not taking: Reported on 07/26/2014 04/11/14   Shelly Bombard, MD  tinidazole (TINDAMAX) 500 MG tablet Take 4 tablets (2,000 mg total) by mouth once. Patient not taking: Reported on 07/26/2014 04/16/14   Shelly Bombard, MD   BP 118/65 mmHg  Pulse 55  Temp(Src) 97.9 F (36.6 C) (Oral)  Resp 16  SpO2 100% Physical Exam  Constitutional: She is oriented to person, place, and time. She appears well-developed and well-nourished. No distress.  HENT:  Head: Normocephalic and atraumatic.  Eyes: Conjunctivae and EOM are normal.  Neck: Neck supple. No tracheal deviation present.  Cardiovascular: Normal rate.   Pulmonary/Chest: Effort normal and breath sounds normal. No respiratory distress. She has no wheezes. She has no rales.  Abdominal: Soft.  Musculoskeletal: Normal range of motion.  Neurological: She is alert and oriented to person, place, and time.  Skin: Skin is warm and dry.  Psychiatric: She has a normal mood and affect. Her behavior is normal.  Nursing note and vitals reviewed.   ED Course  Procedures (including critical care time) Labs Review Labs Reviewed  TROPONIN I  I-STAT TROPOININ, ED    Imaging Review No results found.   EKG Interpretation None     Medications  HYDROcodone-acetaminophen (NORCO/VICODIN) 5-325 MG per tablet 2 tablet (2 tablets Oral Given 07/26/14 1341)    1:19 PM- Treatment plan was discussed with patient who verbalizes understanding and agrees.   MDM   Final  diagnoses:  Diaphoresis  Chest pain, unspecified chest pain type   Patient is a history of anxiety and depression presents for an episode today at work when she felt hot and diaphoretic with shortness of breath. Her friend states that she felt her heart beating deep inside of her chest for going to work today. She denies any chest pain at this time. She has a normal EKG. He is PERC negative. I reviewed the heart score and she is low risk for a major cardiac event. Her troponin is negative. She requested a prescription for Imitrex for chronic migraines but did not have the medication on file in her med list.  She gave me the pharmacy she uses which confirmed that she was taking the medications and that she had a refill available.  I did not refill this medication for her but did make her aware of this.  She can follow up with her pcp.  I personally performed the services described in this documentation, which was scribed in my presence. The recorded information has been reviewed and is accurate.    Ottie Glazier, PA-C 07/26/14 2023  Jola Schmidt, MD 07/31/14 [phone]

## 2014-07-26 NOTE — ED Notes (Signed)
Per EMS, pt began hyperventilating and having an anxiety attack while at work, came close to passing out. On EMS arrival they state they were able to help calm patient down, per EMS pt is no longer hyperventilating at this time.

## 2014-07-26 NOTE — Discharge Instructions (Signed)
Chest Pain (Nonspecific) Follow up with your primary care provider.  Return for chest pain or shortness of breath.  It is often hard to give a specific diagnosis for the cause of chest pain. There is always a chance that your pain could be related to something serious, such as a heart attack or a blood clot in the lungs. You need to follow up with your health care provider for further evaluation. CAUSES   Heartburn.  Pneumonia or bronchitis.  Anxiety or stress.  Inflammation around your heart (pericarditis) or lung (pleuritis or pleurisy).  A blood clot in the lung.  A collapsed lung (pneumothorax). It can develop suddenly on its own (spontaneous pneumothorax) or from trauma to the chest.  Shingles infection (herpes zoster virus). The chest wall is composed of bones, muscles, and cartilage. Any of these can be the source of the pain.  The bones can be bruised by injury.  The muscles or cartilage can be strained by coughing or overwork.  The cartilage can be affected by inflammation and become sore (costochondritis). DIAGNOSIS  Lab tests or other studies may be needed to find the cause of your pain. Your health care provider may have you take a test called an ambulatory electrocardiogram (ECG). An ECG records your heartbeat patterns over a 24-hour period. You may also have other tests, such as:  Transthoracic echocardiogram (TTE). During echocardiography, sound waves are used to evaluate how blood flows through your heart.  Transesophageal echocardiogram (TEE).  Cardiac monitoring. This allows your health care provider to monitor your heart rate and rhythm in real time.  Holter monitor. This is a portable device that records your heartbeat and can help diagnose heart arrhythmias. It allows your health care provider to track your heart activity for several days, if needed.  Stress tests by exercise or by giving medicine that makes the heart beat faster. TREATMENT   Treatment  depends on what may be causing your chest pain. Treatment may include:  Acid blockers for heartburn.  Anti-inflammatory medicine.  Pain medicine for inflammatory conditions.  Antibiotics if an infection is present.  You may be advised to change lifestyle habits. This includes stopping smoking and avoiding alcohol, caffeine, and chocolate.  You may be advised to keep your head raised (elevated) when sleeping. This reduces the chance of acid going backward from your stomach into your esophagus. Most of the time, nonspecific chest pain will improve within 2-3 days with rest and mild pain medicine.  HOME CARE INSTRUCTIONS   If antibiotics were prescribed, take them as directed. Finish them even if you start to feel better.  For the next few days, avoid physical activities that bring on chest pain. Continue physical activities as directed.  Do not use any tobacco products, including cigarettes, chewing tobacco, or electronic cigarettes.  Avoid drinking alcohol.  Only take medicine as directed by your health care provider.  Follow your health care provider's suggestions for further testing if your chest pain does not go away.  Keep any follow-up appointments you made. If you do not go to an appointment, you could develop lasting (chronic) problems with pain. If there is any problem keeping an appointment, call to reschedule. SEEK MEDICAL CARE IF:   Your chest pain does not go away, even after treatment.  You have a rash with blisters on your chest.  You have a fever. SEEK IMMEDIATE MEDICAL CARE IF:   You have increased chest pain or pain that spreads to your arm, neck, jaw, back,  or abdomen.  You have shortness of breath.  You have an increasing cough, or you cough up blood.  You have severe back or abdominal pain.  You feel nauseous or vomit.  You have severe weakness.  You faint.  You have chills. This is an emergency. Do not wait to see if the pain will go away. Get  medical help at once. Call your local emergency services (911 in [place] Do not drive yourself to the hospital. MAKE SURE YOU:   Understand these instructions.  Will watch your condition.  Will get help right away if you are not doing well or get worse. Document Released: 11/26/2004 Document Revised: 02/21/2013 Document Reviewed: 09/22/2007 [facility] Patient Information 2015 [place], [place]. This information is not intended to replace advice given to you by your health care provider. Make sure you discuss any questions you have with your health care provider.

## 2014-08-02 DIAGNOSIS — Z720 Tobacco use: Secondary | ICD-10-CM | POA: Insufficient documentation

## 2014-09-26 ENCOUNTER — Emergency Department (HOSPITAL_COMMUNITY)
Admission: EM | Admit: 2014-09-26 | Discharge: 2014-09-26 | Disposition: A | Discharge status: Home / Self Care | Payer: Medicaid Other | Attending: Emergency Medicine | Admitting: Emergency Medicine

## 2014-09-26 ENCOUNTER — Encounter (HOSPITAL_COMMUNITY): Payer: Self-pay | Admitting: Emergency Medicine

## 2014-09-26 DIAGNOSIS — D649 Anemia, unspecified: Secondary | ICD-10-CM | POA: Insufficient documentation

## 2014-09-26 DIAGNOSIS — Z720 Tobacco use: Secondary | ICD-10-CM | POA: Diagnosis not present

## 2014-09-26 DIAGNOSIS — F319 Bipolar disorder, unspecified: Secondary | ICD-10-CM | POA: Insufficient documentation

## 2014-09-26 DIAGNOSIS — J45909 Unspecified asthma, uncomplicated: Secondary | ICD-10-CM | POA: Diagnosis not present

## 2014-09-26 DIAGNOSIS — Z880 Allergy status to penicillin: Secondary | ICD-10-CM | POA: Diagnosis not present

## 2014-09-26 DIAGNOSIS — Z8679 Personal history of other diseases of the circulatory system: Secondary | ICD-10-CM | POA: Diagnosis not present

## 2014-09-26 DIAGNOSIS — G4700 Insomnia, unspecified: Secondary | ICD-10-CM | POA: Insufficient documentation

## 2014-09-26 DIAGNOSIS — R509 Fever, unspecified: Secondary | ICD-10-CM | POA: Diagnosis present

## 2014-09-26 DIAGNOSIS — J309 Allergic rhinitis, unspecified: Secondary | ICD-10-CM

## 2014-09-26 DIAGNOSIS — Z7951 Long term (current) use of inhaled steroids: Secondary | ICD-10-CM | POA: Diagnosis not present

## 2014-09-26 DIAGNOSIS — Z91040 Latex allergy status: Secondary | ICD-10-CM | POA: Diagnosis not present

## 2014-09-26 DIAGNOSIS — F419 Anxiety disorder, unspecified: Secondary | ICD-10-CM | POA: Insufficient documentation

## 2014-09-26 DIAGNOSIS — Z79899 Other long term (current) drug therapy: Secondary | ICD-10-CM | POA: Insufficient documentation

## 2014-09-26 DIAGNOSIS — H6691 Otitis media, unspecified, right ear: Secondary | ICD-10-CM | POA: Diagnosis not present

## 2014-09-26 MED ORDER — FLUTICASONE PROPIONATE 50 MCG/ACT NA SUSP: 2.0000 | Freq: Every day | NASAL | Status: AC

## 2014-09-26 MED ORDER — AZITHROMYCIN 250 MG PO TABS: 250.0000 mg | ORAL_TABLET | Freq: Every day | ORAL | Status: DC

## 2014-09-26 MED ORDER — CETIRIZINE HCL 10 MG PO TABS: 10.0000 mg | ORAL_TABLET | Freq: Every day | ORAL | Status: DC

## 2014-09-26 MED ORDER — ACETAMINOPHEN 325 MG PO TABS
650.0000 mg | ORAL_TABLET | Freq: Once | ORAL | Status: AC
Start: 2014-09-26 — End: 2014-09-26
  Administered 2014-09-26: 650 mg via ORAL
  Filled 2014-09-26: qty 2

## 2014-09-26 MED ORDER — NAPROXEN 250 MG PO TABS: 250.0000 mg | ORAL_TABLET | Freq: Two times a day (BID) | ORAL | Status: DC

## 2014-09-26 NOTE — ED Notes (Signed)
Unable to visualize ear drum d/t pt jerking away with pain.

## 2014-09-26 NOTE — Discharge Instructions (Signed)
Otitis Media Otitis media is redness, soreness, and inflammation of the middle ear. Otitis media may be caused by allergies or, most commonly, by infection. Often it occurs as a complication of the common cold. SIGNS AND SYMPTOMS Symptoms of otitis media may include:  Earache.  Fever.  Ringing in your ear.  Headache.  Leakage of fluid from the ear. DIAGNOSIS To diagnose otitis media, your health care provider will examine your ear with an otoscope. This is an instrument that allows your health care provider to see into your ear in order to examine your eardrum. Your health care provider also will ask you questions about your symptoms. TREATMENT  Typically, otitis media resolves on its own within 3-5 days. Your health care provider may prescribe medicine to ease your symptoms of pain. If otitis media does not resolve within 5 days or is recurrent, your health care provider may prescribe antibiotic medicines if he or she suspects that a bacterial infection is the cause. HOME CARE INSTRUCTIONS   If you were prescribed an antibiotic medicine, finish it all even if you start to feel better.  Take medicines only as directed by your health care provider.  Keep all follow-up visits as directed by your health care provider. SEEK MEDICAL CARE IF:  You have otitis media only in one ear, or bleeding from your nose, or both.  You notice a lump on your neck.  You are not getting better in 3-5 days.  You feel worse instead of better. SEEK IMMEDIATE MEDICAL CARE IF:   You have pain that is not controlled with medicine.  You have swelling, redness, or pain around your ear or stiffness in your neck.  You notice that part of your face is paralyzed.  You notice that the bone behind your ear (mastoid) is tender when you touch it. MAKE SURE YOU:   Understand these instructions.  Will watch your condition.  Will get help right away if you are not doing well or get worse. Document Released:  11/22/2003 Document Revised: 07/03/2013 Document Reviewed: 09/13/2012 [facility] Patient Information 2015 [place], [place]. This information is not intended to replace advice given to you by your health care provider. Make sure you discuss any questions you have with your health care provider.  Allergic Rhinitis Allergic rhinitis is when the mucous membranes in the nose respond to allergens. Allergens are particles in the air that cause your body to have an allergic reaction. This causes you to release allergic antibodies. Through a chain of events, these eventually cause you to release histamine into the blood stream. Although meant to protect the body, it is this release of histamine that causes your discomfort, such as frequent sneezing, congestion, and an itchy, runny nose.  CAUSES  Seasonal allergic rhinitis (hay fever) is caused by pollen allergens that may come from grasses, trees, and weeds. Year-round allergic rhinitis (perennial allergic rhinitis) is caused by allergens such as house dust mites, pet dander, and mold spores.  SYMPTOMS   Nasal stuffiness (congestion).  Itchy, runny nose with sneezing and tearing of the eyes. DIAGNOSIS  Your health care provider can help you determine the allergen or allergens that trigger your symptoms. If you and your health care provider are unable to determine the allergen, skin or blood testing may be used. TREATMENT  Allergic rhinitis does not have a cure, but it can be controlled by:  Medicines and allergy shots (immunotherapy).  Avoiding the allergen. Hay fever may often be treated with antihistamines in pill or  nasal spray forms. Antihistamines block the effects of histamine. There are over-the-counter medicines that may help with nasal congestion and swelling around the eyes. Check with your health care provider before taking or giving this medicine.  If avoiding the allergen or the medicine prescribed do not work, there are many new medicines your  health care provider can prescribe. Stronger medicine may be used if initial measures are ineffective. Desensitizing injections can be used if medicine and avoidance does not work. Desensitization is when a patient is given ongoing shots until the body becomes less sensitive to the allergen. Make sure you follow up with your health care provider if problems continue. HOME CARE INSTRUCTIONS It is not possible to completely avoid allergens, but you can reduce your symptoms by taking steps to limit your exposure to them. It helps to know exactly what you are allergic to so that you can avoid your specific triggers. SEEK MEDICAL CARE IF:   You have a fever.  You develop a cough that does not stop easily (persistent).  You have shortness of breath.  You start wheezing.  Symptoms interfere with normal daily activities. Document Released: 11/11/2000 Document Revised: 02/21/2013 Document Reviewed: 10/24/2012 [facility] Patient Information 2015 [place], [place]. This information is not intended to replace advice given to you by your health care provider. Make sure you discuss any questions you have with your health care provider.

## 2014-09-26 NOTE — ED Provider Notes (Signed)
History  This chart was scribed for non-physician practitioner, Waynetta Pean, PA-C,working with Serita Grit, MD, by Marlowe Kays, ED Scribe. This patient was seen in room WTR7/WTR7 and the patient's care was started at 3:31 PM.  Chief Complaint  Patient presents with  . Otalgia  . Fever   The history is provided by the patient and medical records. No language interpreter was used.    HPI Comments:  Diane Garrison is a 29 y.o. female who presents to the Emergency Department complaining of right ear pain that began about one week ago. She reports associated fever Tmax 101.3 degrees, post nasal drip and rhinorrhea. She states her last ear infection was about two years ago. She has not taken anything for pain today. She denies modifying factors of her symptoms. She denies discharge from the ear, eye redness,cough, SOB, nausea or vomiting. She reports allergies to PCN and Ampicillin. Pt states she does have a PCP.  Past Medical History  Diagnosis Date  . Migraine   . Anxiety   . Depression   . Insomnia   . Allergy   . Anemia   . Cigarette smoker   . Asthma   . Bipolar disorder     Bipolar 2   Past Surgical History  Procedure Laterality Date  . Tubal ligation    . Cesarean section  2004,2006,2007,2008    x 4  . Wisdom tooth extraction    . Cholecystectomy    . Dilitation & currettage/hystroscopy with hydrothermal ablation N/A 03/16/2014    Procedure: DILATATION & CURETTAGE/HYSTEROSCOPY WITH HYDROTHERMAL ABLATION;  Surgeon: Shelly Bombard, MD;  Location: [facility];  Service: Gynecology;  Laterality: N/A;   Family History  Problem Relation Age of Onset  . Heart disease Mother   . Heart disease Maternal Grandmother    History  Substance Use Topics  . Smoking status: Current Some Day Smoker -- 0.50 packs/day for 3 years    Types: Cigarettes  . Smokeless tobacco: Never Used  . Alcohol Use: No   OB History    No data available     Review of Systems   Constitutional: Positive for fever. Negative for chills.  HENT: Positive for ear pain, postnasal drip and rhinorrhea. Negative for ear discharge, facial swelling, hearing loss, sore throat and trouble swallowing.   Eyes: Negative for redness and visual disturbance.  Respiratory: Negative for cough and shortness of breath.   Gastrointestinal: Negative for nausea and vomiting.  Musculoskeletal: Negative for myalgias.  Skin: Negative for rash.  Neurological: Negative for dizziness, light-headedness and headaches.    Allergies  Coconut flavor; Ampicillin; and Latex  Home Medications   Prior to Admission medications   Medication Sig Start Date End Date Taking? Authorizing Provider  albuterol (PROVENTIL HFA;VENTOLIN HFA) 108 (90 BASE) MCG/ACT inhaler Inhale 2 puffs into the lungs every 6 (six) hours as needed. For shortness of breath.    Historical Provider, MD  ARIPiprazole (ABILIFY) 20 MG tablet Take 20 mg by mouth daily.    Historical Provider, MD  azithromycin (ZITHROMAX) 250 MG tablet Take 1 tablet (250 mg total) by mouth daily. Take first 2 tablets together, then 1 every day until finished. 09/26/14   Waynetta Pean, PA-C  beclomethasone (QVAR) 40 MCG/ACT inhaler Inhale 1 puff into the lungs daily as needed (shortness of breath).     Historical Provider, MD  cetirizine (ZYRTEC ALLERGY) 10 MG tablet Take 1 tablet (10 mg total) by mouth daily. 09/26/14   Waynetta Pean, PA-C  clindamycin (CLEOCIN) 300 MG capsule Take 1 capsule (300 mg total) by mouth 3 (three) times daily. Patient not taking: Reported on 07/26/2014 04/11/14   Shelly Bombard, MD  fluticasone [facility]) 50 MCG/ACT nasal spray Place 2 sprays into both nostrils daily. 09/26/14   Waynetta Pean, PA-C  ibuprofen (ADVIL,MOTRIN) 800 MG tablet Take 1 tablet (800 mg total) by mouth every 8 (eight) hours as needed. Patient not taking: Reported on 04/11/2014 03/22/14   Shelly Bombard, MD  loratadine-pseudoephedrine (CLARITIN-D 24-HOUR)  10-240 MG per 24 hr tablet Take 1 tablet by mouth daily as needed for allergies.     Historical Provider, MD  naproxen (NAPROSYN) 250 MG tablet Take 1 tablet (250 mg total) by mouth 2 (two) times daily with a meal. 09/26/14   Waynetta Pean, PA-C  oxyCODONE-acetaminophen (ROXICET) 5-325 MG per tablet Take 1-2 tablets by mouth every 4 (four) hours as needed for moderate pain or severe pain. Patient not taking: Reported on 04/11/2014 03/16/14   Shelly Bombard, MD  Prenat w/o A Vit-FeFum-FePo-FA (CONCEPT OB) 130-92.4-1 MG CAPS Take 1 capsule by mouth daily. Patient not taking: Reported on 07/26/2014 04/11/14   Shelly Bombard, MD  tinidazole (TINDAMAX) 500 MG tablet Take 4 tablets (2,000 mg total) by mouth once. Patient not taking: Reported on 07/26/2014 04/16/14   Shelly Bombard, MD  traZODone (DESYREL) 100 MG tablet Take 100 mg by mouth at bedtime.    Historical Provider, MD  triamcinolone cream (KENALOG) 0.1 % Apply 1 application topically as needed (rash/itching).     Historical Provider, MD   Triage Vitals: BP 119/64 mmHg  Pulse 70  Temp(Src) 98.6 F (37 C) (Oral)  Resp 18  SpO2 97% Physical Exam  Constitutional: She appears well-developed and well-nourished. No distress.  Nontoxic appearing.  HENT:  Head: Normocephalic and atraumatic.  Right Ear: External ear normal. No mastoid tenderness. Tympanic membrane is erythematous (mild). Tympanic membrane is not bulging.  Left Ear: External ear normal. No mastoid tenderness.  Mouth/Throat: Uvula is midline, oropharynx is clear and moist and mucous membranes are normal. No oropharyngeal exudate, posterior oropharyngeal edema or posterior oropharyngeal erythema.  Tenderness with manipulation of pinna. Mildly erythematous right TM without bulging or loss of landmarks. Left TM is normal. No ear discharge. No mastoid tenderness.  Eyes: Conjunctivae are normal. Pupils are equal, round, and reactive to light. Right eye exhibits no discharge. Left eye  exhibits no discharge.  Neck: Neck supple. No JVD present. No tracheal deviation present.  No tonsillar hypertrophy or exudates.  Cardiovascular: Normal rate, regular rhythm, normal heart sounds and intact distal pulses.   Pulmonary/Chest: Effort normal and breath sounds normal. No respiratory distress. She has no wheezes. She has no rales.  Lungs clear to auscultation bilaterally.  Abdominal: Soft. There is no tenderness.  Lymphadenopathy:    She has no cervical adenopathy.  Neurological: She is alert. Coordination normal.  Skin: Skin is warm and dry. No rash noted. She is not diaphoretic. No erythema. No pallor.  Psychiatric: She has a normal mood and affect. Her behavior is normal.  Nursing note and vitals reviewed.   ED Course  Procedures (including critical care time) DIAGNOSTIC STUDIES: Oxygen Saturation is 97% on RA, normal by my interpretation.   COORDINATION OF CARE: 3:39 PM- Will prescribe antibiotic, Flonase nasal spray, Cetirizine and Naproxen. Will order Tylenol prior to discharge. Advised pt to follow up with her PCP for continued symptoms. Pt verbalizes understanding and agrees to plan.  Medications  acetaminophen (TYLENOL) tablet 650 mg (650 mg Oral Given 09/26/14 1551)    Labs Review Labs Reviewed - No data to display  Imaging Review No results found.   EKG Interpretation None      Filed Vitals:   09/26/14 1447  BP: 119/64  Pulse: 70  Temp: 98.6 F (37 C)  TempSrc: Oral  Resp: 18  SpO2: 97%     MDM   Meds given in ED:  Medications  acetaminophen (TYLENOL) tablet 650 mg (650 mg Oral Given 09/26/14 1551)    New Prescriptions   AZITHROMYCIN (ZITHROMAX) 250 MG TABLET    Take 1 tablet (250 mg total) by mouth daily. Take first 2 tablets together, then 1 every day until finished.   CETIRIZINE (ZYRTEC ALLERGY) 10 MG TABLET    Take 1 tablet (10 mg total) by mouth daily.   FLUTICASONE (FLONASE) 50 MCG/ACT NASAL SPRAY    Place 2 sprays into both nostrils  daily.   NAPROXEN (NAPROSYN) 250 MG TABLET    Take 1 tablet (250 mg total) by mouth 2 (two) times daily with a meal.    Final diagnoses:  Acute right otitis media, recurrence not specified, unspecified otitis media type  Allergic rhinitis, unspecified allergic rhinitis type   This is a 29 y.o. female who presents to the Emergency Department complaining of right ear pain that began about one week ago. She reports associated fever Tmax 101.3 degrees, post nasal drip and rhinorrhea.  On exam the patient is afebrile and nontoxic appearing. Patient has mild erythema of her right tympanic membrane without effusion or loss of landmarks. No ear discharge. Left TM is normal.Tenderness with manipulation of her pain on her right side. Lungs are clear to auscultation bilaterally. Patient with right otitis media. Also symptoms of allergic rhinitis. Will prescribe azithromycin due to allergy to penicillin. Also prescription for cetirizine, fluticasone nasal spray and naproxen for pain. I advised the patient to follow-up with their primary care provider this week. I advised the patient to return to the emergency department with new or worsening symptoms or new concerns. The patient verbalized understanding and agreement with plan.     I personally performed the services described in this documentation, which was scribed in my presence. The recorded information has been reviewed and is accurate.    Waynetta Pean, PA-C 09/26/14 1554  Serita Grit, MD 09/26/14 [phone]

## 2014-09-26 NOTE — ED Notes (Signed)
Pt complaining of occasional fever and right sided ear pain on-going for a week now.

## 2015-02-18 ENCOUNTER — Encounter (HOSPITAL_COMMUNITY): Payer: Self-pay | Admitting: Emergency Medicine

## 2015-02-18 ENCOUNTER — Emergency Department (HOSPITAL_COMMUNITY): Payer: Medicaid Other

## 2015-02-18 ENCOUNTER — Emergency Department (HOSPITAL_COMMUNITY)
Admission: EM | Admit: 2015-02-18 | Discharge: 2015-02-18 | Disposition: A | Discharge status: Home / Self Care | Payer: Medicaid Other | Attending: Emergency Medicine | Admitting: Emergency Medicine

## 2015-02-18 DIAGNOSIS — Z862 Personal history of diseases of the blood and blood-forming organs and certain disorders involving the immune mechanism: Secondary | ICD-10-CM | POA: Diagnosis not present

## 2015-02-18 DIAGNOSIS — J45909 Unspecified asthma, uncomplicated: Secondary | ICD-10-CM | POA: Diagnosis not present

## 2015-02-18 DIAGNOSIS — Y998 Other external cause status: Secondary | ICD-10-CM | POA: Diagnosis not present

## 2015-02-18 DIAGNOSIS — X501XXA Overexertion from prolonged static or awkward postures, initial encounter: Secondary | ICD-10-CM | POA: Insufficient documentation

## 2015-02-18 DIAGNOSIS — F319 Bipolar disorder, unspecified: Secondary | ICD-10-CM | POA: Insufficient documentation

## 2015-02-18 DIAGNOSIS — Z791 Long term (current) use of non-steroidal anti-inflammatories (NSAID): Secondary | ICD-10-CM | POA: Diagnosis not present

## 2015-02-18 DIAGNOSIS — F17210 Nicotine dependence, cigarettes, uncomplicated: Secondary | ICD-10-CM | POA: Insufficient documentation

## 2015-02-18 DIAGNOSIS — F419 Anxiety disorder, unspecified: Secondary | ICD-10-CM | POA: Insufficient documentation

## 2015-02-18 DIAGNOSIS — Y9389 Activity, other specified: Secondary | ICD-10-CM | POA: Diagnosis not present

## 2015-02-18 DIAGNOSIS — M25532 Pain in left wrist: Secondary | ICD-10-CM

## 2015-02-18 DIAGNOSIS — G4700 Insomnia, unspecified: Secondary | ICD-10-CM | POA: Diagnosis not present

## 2015-02-18 DIAGNOSIS — Z79899 Other long term (current) drug therapy: Secondary | ICD-10-CM | POA: Insufficient documentation

## 2015-02-18 DIAGNOSIS — Y9289 Other specified places as the place of occurrence of the external cause: Secondary | ICD-10-CM | POA: Insufficient documentation

## 2015-02-18 DIAGNOSIS — Z91040 Latex allergy status: Secondary | ICD-10-CM | POA: Diagnosis not present

## 2015-02-18 DIAGNOSIS — Z7951 Long term (current) use of inhaled steroids: Secondary | ICD-10-CM | POA: Diagnosis not present

## 2015-02-18 DIAGNOSIS — S6992XA Unspecified injury of left wrist, hand and finger(s), initial encounter: Secondary | ICD-10-CM | POA: Insufficient documentation

## 2015-02-18 MED ORDER — IBUPROFEN 800 MG PO TABS
800.0000 mg | ORAL_TABLET | Freq: Once | ORAL | Status: AC
  Administered 2015-02-18: 800 mg via ORAL
  Filled 2015-02-18: qty 1

## 2015-02-18 MED ORDER — IBUPROFEN 600 MG PO TABS
600.0000 mg | ORAL_TABLET | Freq: Four times a day (QID) | ORAL | Status: DC | PRN
Start: 2015-02-18 — End: 2018-05-02

## 2015-02-18 NOTE — ED Provider Notes (Signed)
CSN: [id number]     Arrival date & time 02/18/15  1758 History  By signing my name below, I, Starleen Arms, attest that this documentation has been prepared under the direction and in the presence of Bernerd Limbo, [place]. Electronically Signed: Starleen Arms ED Scribe. 02/18/2015. 6:20 PM.  Chief Complaint  Patient presents with  . Wrist Injury    The history is provided by the patient. No language interpreter was used.    HPI Comments: Diane Garrison is a 29 y.o. female who presents to the Emergency Department complaining of constant, worse with ROM, left wrist pain onset PTA; unrelieved by ice (no other tx's tried). The patient reports she was pulling on a cabbage and hyperextended her wrist.    Past Medical History  Diagnosis Date  . Migraine   . Anxiety   . Depression   . Insomnia   . Allergy   . Anemia   . Cigarette smoker   . Asthma   . Bipolar disorder ([place])     Bipolar 2   Past Surgical History  Procedure Laterality Date  . Tubal ligation    . Cesarean section  2004,2006,2007,2008    x 4  . Wisdom tooth extraction    . Cholecystectomy    . Dilitation & currettage/hystroscopy with hydrothermal ablation N/A 03/16/2014    Procedure: DILATATION & CURETTAGE/HYSTEROSCOPY WITH HYDROTHERMAL ABLATION;  Surgeon: Shelly Bombard, MD;  Location: [facility];  Service: Gynecology;  Laterality: N/A;   Family History  Problem Relation Age of Onset  . Heart disease Mother   . Heart disease Maternal Grandmother    Social History  Substance Use Topics  . Smoking status: Current Some Day Smoker -- 0.50 packs/day for 3 years    Types: Cigarettes  . Smokeless tobacco: Never Used  . Alcohol Use: No   OB History    No data available       Review of Systems  Musculoskeletal: Positive for arthralgias.  Neurological: Negative for weakness and numbness.      Allergies  Coconut flavor; Ampicillin; and Latex  Home Medications   Prior to Admission medications    Medication Sig Start Date End Date Taking? Authorizing Provider  albuterol (PROVENTIL HFA;VENTOLIN HFA) 108 (90 BASE) MCG/ACT inhaler Inhale 2 puffs into the lungs every 6 (six) hours as needed. For shortness of breath.    Historical Provider, MD  ARIPiprazole (ABILIFY) 20 MG tablet Take 20 mg by mouth daily.    Historical Provider, MD  azithromycin (ZITHROMAX) 250 MG tablet Take 1 tablet (250 mg total) by mouth daily. Take first 2 tablets together, then 1 every day until finished. 09/26/14   Waynetta Pean, PA-C  beclomethasone (QVAR) 40 MCG/ACT inhaler Inhale 1 puff into the lungs daily as needed (shortness of breath).     Historical Provider, MD  cetirizine (ZYRTEC ALLERGY) 10 MG tablet Take 1 tablet (10 mg total) by mouth daily. 09/26/14   Waynetta Pean, PA-C  clindamycin (CLEOCIN) 300 MG capsule Take 1 capsule (300 mg total) by mouth 3 (three) times daily. Patient not taking: Reported on 07/26/2014 04/11/14   Shelly Bombard, MD  fluticasone [facility]) 50 MCG/ACT nasal spray Place 2 sprays into both nostrils daily. 09/26/14   Waynetta Pean, PA-C  ibuprofen (ADVIL,MOTRIN) 600 MG tablet Take 1 tablet (600 mg total) by mouth every 6 (six) hours as needed. 02/18/15   Marella Chimes, PA-C  loratadine-pseudoephedrine (CLARITIN-D 24-HOUR) 10-240 MG per 24 hr tablet Take 1 tablet by  mouth daily as needed for allergies.     Historical Provider, MD  naproxen (NAPROSYN) 250 MG tablet Take 1 tablet (250 mg total) by mouth 2 (two) times daily with a meal. 09/26/14   Waynetta Pean, PA-C  oxyCODONE-acetaminophen (ROXICET) 5-325 MG per tablet Take 1-2 tablets by mouth every 4 (four) hours as needed for moderate pain or severe pain. Patient not taking: Reported on 04/11/2014 03/16/14   Shelly Bombard, MD  Prenat w/o A Vit-FeFum-FePo-FA (CONCEPT OB) 130-92.4-1 MG CAPS Take 1 capsule by mouth daily. Patient not taking: Reported on 07/26/2014 04/11/14   Shelly Bombard, MD  tinidazole (TINDAMAX) 500 MG tablet  Take 4 tablets (2,000 mg total) by mouth once. Patient not taking: Reported on 07/26/2014 04/16/14   Shelly Bombard, MD  traZODone (DESYREL) 100 MG tablet Take 100 mg by mouth at bedtime.    Historical Provider, MD  triamcinolone cream (KENALOG) 0.1 % Apply 1 application topically as needed (rash/itching).     Historical Provider, MD    BP 108/77 mmHg  Pulse 80  Temp(Src) 98.4 F (36.9 C) (Oral)  Resp 18  Ht 5\' 1"  (1.549 m)  Wt 67.132 kg  BMI 27.98 kg/m2  SpO2 98%  LMP 12/12/2014 Physical Exam  Constitutional: She is oriented to person, place, and time. She appears well-developed and well-nourished. No distress.  HENT:  Head: Normocephalic and atraumatic.  Right Ear: External ear normal.  Left Ear: External ear normal.  Nose: Nose normal.  Eyes: Conjunctivae and EOM are normal. Right eye exhibits no discharge. Left eye exhibits no discharge. No scleral icterus.  Neck: Normal range of motion. Neck supple. No tracheal deviation present.  Cardiovascular: Normal rate, regular rhythm and intact distal pulses.   Pulmonary/Chest: Effort normal and breath sounds normal. No respiratory distress.  Musculoskeletal: She exhibits tenderness. She exhibits no edema.  Diffuse tenderness to palpation of left hand and wrist with decreased range of motion due to pain. No erythema, edema, or heat.  Neurological: She is alert and oriented to person, place, and time. No sensory deficit.  Decreased grip strength of left hand due to pain.  Skin: Skin is warm and dry. She is not diaphoretic.  Psychiatric: She has a normal mood and affect. Her behavior is normal.  Nursing note and vitals reviewed.   ED Course  Procedures (including critical care time)  DIAGNOSTIC STUDIES: Oxygen Saturation is 98% on RA, normal by my interpretation.    COORDINATION OF CARE:  6:17 PM Discussed treatment plan with patient at bedside.  Patient acknowledges and agrees with plan.    Labs Review Labs Reviewed - No data  to display  Imaging Review Dg Wrist Complete Left  02/18/2015  CLINICAL DATA:  28 year old female with left wrist pain. EXAM: LEFT WRIST - COMPLETE 3+ VIEW COMPARISON:  Stop FINDINGS: There is no fracture or dislocation. The bones are osteopenic for the patient's age. The soft tissues appear unremarkable. IMPRESSION: No acute fracture or dislocation. Electronically Signed   By: Anner Crete M.D.   On: 02/18/2015 19:00   Dg Hand Complete Left  02/18/2015  CLINICAL DATA:  29 year old female with severe left wrist pain which developed after opening a head of CABG. EXAM: LEFT HAND - COMPLETE 3+ VIEW COMPARISON:  None. FINDINGS: There is no evidence of fracture or dislocation. There is no evidence of arthropathy or other focal bone abnormality. Soft tissues are unremarkable. IMPRESSION: Negative. Electronically Signed   By: Jacqulynn Cadet M.D.   On: 02/18/2015  18:58     I have personally reviewed and evaluated these images as part of my medical decision-making.   EKG Interpretation None      MDM   Final diagnoses:  Left wrist pain    28 year old female presents with left hand and wrist pain, which she states occurred after opening up a cabbage and hyperextending her wrist prior to arrival. She denies numbness, weakness, paresthesia. On exam, patient has diffuse tenderness palpation of her left hand and wrist with decreased range of motion due to pain. No erythema, edema, or heat. Distal pulses intact. Decreased strength of left hand due to pain. Sensation intact.  Patient given ibuprofen and ice for symptoms. Will obtain imaging of left hand and wrist.  Imaging negative for fracture, dislocation, arthropathy, focal bone abnormality, soft tissue swelling. Feel patient is stable for discharge at this time. Will give wrist splint. Advised patient to take ibuprofen, rest, ice, and elevate. Return precautions discussed. Patient verbalizes her understanding and is in agreement with  plan.  BP 108/77 mmHg  Pulse 80  Temp(Src) 98.4 F (36.9 C) (Oral)  Resp 18  Ht 5\' 1"  (1.549 m)  Wt 67.132 kg  BMI 27.98 kg/m2  SpO2 98%  LMP 12/12/2014  I personally performed the services described in this documentation, which was scribed in my presence. The recorded information has been reviewed and is accurate.   Marella Chimes, PA-C 02/18/15 1919  Harvel Quale, MD 02/21/15 [phone]

## 2015-02-18 NOTE — ED Notes (Signed)
Pt was opening up a cabbage with left hand when hand and wrist pain began. States she felt a pop. Palpable pulse.

## 2015-02-18 NOTE — Discharge Instructions (Signed)
1. Medications: ibuprofen, usual home medications 2. Treatment: rest, drink plenty of fluids, ice, elevate, wear wrist splint 3. Follow Up: please followup with your primary doctor for discussion of your diagnoses and further evaluation after today's visit and with the hand specialists for persistent or worsening symptoms; if you do not have a primary care doctor use the resource guide provided to find one; please return to the ER for numbness, weakness, new or worsening symptoms   Wrist Pain There are many things that can cause wrist pain. Some common causes include:  An injury to the wrist area, such as a sprain, strain, or fracture.  Overuse of the joint.  A condition that causes increased pressure on a nerve in the wrist (carpal tunnel syndrome).  Wear and tear of the joints that occurs with aging (osteoarthritis).  A variety of other types of arthritis. Sometimes, the cause of wrist pain is not known. The pain often goes away when you follow your health care provider's instructions for relieving pain at home. If your wrist pain continues, tests may need to be done to diagnose your condition. HOME CARE INSTRUCTIONS Pay attention to any changes in your symptoms. Take these actions to help with your pain:  Rest the wrist area for at least 48 hours or as told by your health care provider.  If directed, apply ice to the injured area:  Put ice in a plastic bag.  Place a towel between your skin and the bag.  Leave the ice on for 20 minutes, 2-3 times per day.  Keep your arm raised (elevated) above the level of your heart while you are sitting or lying down.  If a splint or elastic bandage has been applied, use it as told by your health care provider.  Remove the splint or bandage only as told by your health care provider.  Loosen the splint or bandage if your fingers become numb or have a tingling feeling, or if they turn cold or blue.  Take over-the-counter and prescription  medicines only as told by your health care provider.  Keep all follow-up visits as told by your health care provider. This is important. SEEK MEDICAL CARE IF:  Your pain is not helped by treatment.  Your pain gets worse. SEEK IMMEDIATE MEDICAL CARE IF:  Your fingers become swollen.  Your fingers turn white, very red, or cold and blue.  Your fingers are numb or have a tingling feeling.  You have difficulty moving your fingers.   This information is not intended to replace advice given to you by your health care provider. Make sure you discuss any questions you have with your health care provider.   Document Released: 11/26/2004 Document Revised: 11/07/2014 Document Reviewed: 07/04/2014 Elsevier Interactive Patient Education 2016 [employer].   Emergency Department Resource Guide 1) Find a Doctor and Pay Out of Pocket Although you won't have to find out who is covered by your insurance plan, it is a good idea to ask around and get recommendations. You will then need to call the office and see if the doctor you have chosen will accept you as a new patient and what types of options they offer for patients who are self-pay. Some doctors offer discounts or will set up payment plans for their patients who do not have insurance, but you will need to ask so you aren't surprised when you get to your appointment.  2) Contact Your Local Health Department Not all health departments have doctors that can see  patients for sick visits, but many do, so it is worth a call to see if yours does. If you don't know where your local health department is, you can check in your phone book. The CDC also has a tool to help you locate your state's health department, and many state websites also have listings of all of their local health departments.  3) Find a [facility] If your illness is not likely to be very severe or complicated, you may want to try a walk in clinic. These are popping up all over the  country in pharmacies, drugstores, and shopping centers. They're usually staffed by nurse practitioners or physician assistants that have been trained to treat common illnesses and complaints. They're usually fairly quick and inexpensive. However, if you have serious medical issues or chronic medical problems, these are probably not your best option.  No Primary Care Doctor: - Call Health Connect at  [phone] - they can help you locate a primary care doctor that  accepts your insurance, provides certain services, etc. - Physician Referral Service- [phone]  Chronic Pain Problems: Organization         Address  Phone   Notes  [facility]  [phone] Patients need to be referred by their primary care doctor.   Medication Assistance: Organization         Address  Phone   Notes  [facility] Medication [facility] [address] [place], [place] [address] [phone] --Must be a resident of [facility] -- Must have NO insurance coverage whatsoever (no Medicaid/ Medicare, etc.) -- The pt. MUST have a primary care doctor that directs their care regularly and follows them in the community   MedAssist  [phone]   [employer]  [phone]    Agencies that provide inexpensive medical care: Organization         Address  Phone   Notes  [place]  [phone]   [facility]    [phone]   [facility] [place], [place] [address] [phone]   [place] [address] [place] [phone]   Planned Parenthood    [phone]   [facility]    [phone]   [place] and [place] [address], [place] Phone:  [phone], Fax:  [phone] Hours of Operation:  9 am - 6 pm, M-F.  Also accepts Medicaid/Medicare and self-pay.  [facility] for [place], [address], [place] Phone: [phone], Fax: [phone]. Hours of Operation:  8:30 am - 5:30 pm, M-F.  Also accepts Medicaid and self-pay.  [facility] [place] [address], [place] Phone: [phone]   [place], [place], [place] [phone], Ext. [phone] Mondays & Thursdays: 7-9 AM.  First 15 patients are seen on a first come, first serve basis.    [place] Providers:  Organization         Address  Phone   Notes  [facility] [address], [place] [phone] Also accepts self-pay patients.  [place], [place]  [phone]   [place], [address], [place] [phone]   [place] [address], [place] (  [place]) [place] [address], [place]   [phone] Only accepts [place] Access [place] patients after they have their name applied to their card.   Self-Pay (no insurance) in [facility]:  Organization         Address  Phone   Notes  Sickle Cell Patients, [facility] [place] [phone]   [facility] [place] [phone]   [facility] [place]  [place], [place], [place] [phone]   [facility]/Dr. Osei-Bonsu  [address] [place] or [address], [place] [phone] Phone number for both [place] and [place] locations is the same.  Urgent Medical and [facility] [address], [place] [phone]   [facility] [address] [place] or [address] [phone]   [facility] [address] [place] [phone], phone; [phone], fax Sees patients 1st and 3rd Saturday of every month.  Must  not qualify for public or private insurance (i.e. Medicaid, Medicare, [place] Health Choice, Veterans' Benefits)  Household income should be no more than 200% of the poverty level The clinic cannot treat you if you are pregnant or think you are pregnant  Sexually transmitted diseases are not treated at the clinic.    Dental Care: Organization         Address  Phone  Notes  [facility] Department of [facility] [place] [phone] Accepts children up to age 26 who are enrolled in [place] or [place]; pregnant women with a Medicaid card; and children who have applied for Medicaid or [place] Health Choice, but were declined, whose parents can pay a reduced fee at time of service.  [facility] Department of [facility]  [address], [place] [phone] Accepts children up to age 28 who are enrolled in [place] or [place]; pregnant women with a Medicaid card; and children who have applied for Medicaid or [place] Health Choice, but were declined, whose parents can pay a reduced fee at time of service.  [place] Adult Dental Access PROGRAM  [place] [phone] Patients are seen by appointment only. Walk-ins are not accepted. [place] will see patients 73 years of age and older. Monday - Tuesday (8am-5pm) Most Wednesdays (8:30-5pm) $30 per visit, cash only  [facility] Adult Dental Access PROGRAM  [address], [facility] [phone] Patients are seen by appointment only. Walk-ins are not accepted. [place] will see patients 74 years of age and older. One Wednesday Evening (Monthly: Volunteer Based).  $30 per visit, cash only  [place]  [phone] for adults; Children under age 39, call Graduate Pediatric Dentistry at [phone]. Children aged 7-14, please call [phone] to request a pediatric application.  Dental services are  provided in all areas of dental care including fillings, crowns and bridges, complete and partial dentures, implants, gum treatment, root canals, and extractions. Preventive care is also provided. Treatment is provided to both adults and children. Patients are selected via a lottery and there is often a waiting list.   [facility] [address] [place]  [phone] [email or web address], [place]  [place], [place] [phone], Ext. [phone] Second and Fourth Thursday of each month, opens at 6:30 AM; Clinic ends at 9 AM.  Patients are seen on a first-come first-served basis, and a limited number are seen during each clinic.   [facility]  [address] Hillard Danker [place], [place] [phone]   Eligibility Requirements You must have lived in [place], [place], or [place] counties for at least the last three months.   You cannot be eligible for state or federal sponsored [employer], including [employer], [place], or [employer].   You generally cannot be eligible for healthcare insurance through your employer.    How to apply: Eligibility screenings are held every Tuesday and Wednesday afternoon from 1:00 pm until 4:00 pm. You do not need an appointment for the interview!  [facility] [address], [place], [place]   [place]  [place] Department  [place]  [phone]    Behavioral Health Resources in the Community: Intensive Outpatient Programs Organization         Address  Phone  Notes  [place]. [address], [place], [place] [phone]   [facility] Outpatient [address], [place], [place]   ADS: Alcohol & Drug Svcs [address] [place], [place]   [place] [address]  [place], [place] or [phone]   Substance Abuse Resources Organization         Address  Phone  Notes  Alcohol and Drug Services  [phone]   [place]  [phone]   The [place]   Chinita Pester  [phone]   Residential & Outpatient Substance Abuse Program  [phone]   Psychological Services Organization         Address  Phone  Notes  [facility] [place]  [place]  [phone]   [place] [address], [place] or [phone]    Mobile Crisis Teams Organization         Address  Phone  Notes  [facility], Mobile Crisis Care Unit  [phone]   Assertive Psychotherapeutic Services  [address] [place], [place]   [place] Levels [address] [place] [phone]    Self-Help/Support Groups Organization         Address  Phone             Notes  [place]. of [place] - variety of support groups  [place] Call for more information  Narcotics Anonymous (NA), Caring Services [address], [employer] [place]  2 meetings at this location   Special educational needs teacher         Address  Phone  Notes  ASAP Residential Treatment [place],    Startup  [phone]   [facility]  [address] [place] [phone], [place], [place]   [place], [place] [phone] Admissions: 8am-3pm M-F  Incentives Substance [place] [address]    [place], [place] [phone]   [facility] [address] Jadene Pierini [place], [place]   The [place].,  [place], [place] - Intensive Outpatient [address], [place], [place]   [place] (  [facility]iction [facility] Care [facility] [place].,  [place], [place] [phone] or  [phone]   [facility]idential Treatment Services (RTS) [address] [place], [place] Accepts Medicaid  [facility] [place] [address]  [place] [phone] Substance Abuse/Addiction Treatment   [facility] Organization         Address  Phone  Notes  [facility]  [phone]   Domenic Schwab, PhD [address] [place], [place]   [phone] or [phone]   [place], [place] [phone]   [address], [place], [place] [phone] Insurance/Medicaid/sponsorship through [facility] and Families [address] [place]                                    [place], [place] [phone] [place] [address], [place] [phone]    Dr. Adele Schilder  [phone]   [facility] [place] Dept. 1) [address], [place] 2) Mobile 3)  [address], [place] [phone]  [phone]   [place] [phone] or [phone] (After Hours)

## 2015-02-26 DIAGNOSIS — J4540 Moderate persistent asthma, uncomplicated: Secondary | ICD-10-CM | POA: Insufficient documentation

## 2015-04-08 ENCOUNTER — Encounter (HOSPITAL_COMMUNITY): Payer: Self-pay

## 2015-04-08 ENCOUNTER — Emergency Department (HOSPITAL_COMMUNITY)
Admission: EM | Admit: 2015-04-08 | Discharge: 2015-04-08 | Disposition: A | Discharge status: Home / Self Care | Payer: Medicaid Other | Attending: Emergency Medicine | Admitting: Emergency Medicine

## 2015-04-08 ENCOUNTER — Encounter (HOSPITAL_COMMUNITY): Payer: Self-pay | Admitting: Emergency Medicine

## 2015-04-08 ENCOUNTER — Emergency Department (HOSPITAL_COMMUNITY)
Admission: EM | Admit: 2015-04-08 | Discharge: 2015-04-08 | Disposition: A | Discharge status: Home / Self Care | Payer: Medicaid Other | Source: Home / Self Care

## 2015-04-08 ENCOUNTER — Emergency Department (HOSPITAL_COMMUNITY)
Admission: EM | Admit: 2015-04-08 | Discharge: 2015-04-08 | Discharge status: Against Medical Advice | Payer: Medicaid Other | Source: Home / Self Care

## 2015-04-08 DIAGNOSIS — J45909 Unspecified asthma, uncomplicated: Secondary | ICD-10-CM

## 2015-04-08 DIAGNOSIS — F319 Bipolar disorder, unspecified: Secondary | ICD-10-CM | POA: Diagnosis not present

## 2015-04-08 DIAGNOSIS — Z880 Allergy status to penicillin: Secondary | ICD-10-CM | POA: Insufficient documentation

## 2015-04-08 DIAGNOSIS — Z862 Personal history of diseases of the blood and blood-forming organs and certain disorders involving the immune mechanism: Secondary | ICD-10-CM | POA: Insufficient documentation

## 2015-04-08 DIAGNOSIS — Z91040 Latex allergy status: Secondary | ICD-10-CM | POA: Diagnosis not present

## 2015-04-08 DIAGNOSIS — F17210 Nicotine dependence, cigarettes, uncomplicated: Secondary | ICD-10-CM

## 2015-04-08 DIAGNOSIS — N764 Abscess of vulva: Secondary | ICD-10-CM

## 2015-04-08 DIAGNOSIS — Z7951 Long term (current) use of inhaled steroids: Secondary | ICD-10-CM | POA: Insufficient documentation

## 2015-04-08 DIAGNOSIS — N760 Acute vaginitis: Secondary | ICD-10-CM

## 2015-04-08 DIAGNOSIS — G43909 Migraine, unspecified, not intractable, without status migrainosus: Secondary | ICD-10-CM | POA: Diagnosis not present

## 2015-04-08 DIAGNOSIS — Z79899 Other long term (current) drug therapy: Secondary | ICD-10-CM | POA: Insufficient documentation

## 2015-04-08 DIAGNOSIS — F419 Anxiety disorder, unspecified: Secondary | ICD-10-CM | POA: Insufficient documentation

## 2015-04-08 DIAGNOSIS — N898 Other specified noninflammatory disorders of vagina: Secondary | ICD-10-CM

## 2015-04-08 MED ORDER — CHLORHEXIDINE GLUCONATE 4 % EX LIQD: Freq: Every day | CUTANEOUS | Status: DC | PRN

## 2015-04-08 MED ORDER — TRAMADOL HCL 50 MG PO TABS: 50.0000 mg | ORAL_TABLET | Freq: Four times a day (QID) | ORAL | Status: DC | PRN

## 2015-04-08 MED ORDER — METRONIDAZOLE 500 MG PO TABS: 500.0000 mg | ORAL_TABLET | Freq: Two times a day (BID) | ORAL | Status: DC

## 2015-04-08 MED ORDER — SODIUM BICARBONATE 4 % IV SOLN
5.0000 mL | Freq: Once | INTRAVENOUS | Status: AC
  Administered 2015-04-08: 5 mL via SUBCUTANEOUS
  Filled 2015-04-08: qty 5

## 2015-04-08 MED ORDER — LIDOCAINE HCL (PF) 1 % IJ SOLN
10.0000 mL | Freq: Once | INTRAMUSCULAR | Status: AC
  Administered 2015-04-08: 10 mL
  Filled 2015-04-08: qty 10

## 2015-04-08 MED ORDER — FLUCONAZOLE 100 MG PO TABS
150.0000 mg | ORAL_TABLET | Freq: Once | ORAL | Status: AC
  Administered 2015-04-08: 150 mg via ORAL
  Filled 2015-04-08: qty 2

## 2015-04-08 NOTE — ED Provider Notes (Signed)
CSN: [id number]     Arrival date & time 04/08/15  2026 History  By signing my name below, I, Doran Stabler, attest that this documentation has been prepared under the direction and in the presence of Margarita Mail, PA-C. Electronically Signed: Doran Stabler, ED Scribe. 04/08/2015 10:44 PM.   Chief Complaint  Patient presents with  . Abscess   The history is provided by the patient. No language interpreter was used.   HPI Comments: Diane Garrison is a 30 y.o. female with a h/o vaginal abscess who presents to the Emergency Department complaining of a vaginal abscess that has been growing in size with associated pain and swelling for the past 1 week. Pt had an abscess in 2003 that had to be surgically removed and another in 2007 which was I&D. Pt denies and fevers, chills, vaginal discharge, or any other sx at this time.   Past Medical History  Diagnosis Date  . Migraine   . Anxiety   . Depression   . Insomnia   . Allergy   . Anemia   . Cigarette smoker   . Asthma   . Bipolar disorder ([place])     Bipolar 2   Past Surgical History  Procedure Laterality Date  . Tubal ligation    . Cesarean section  2004,2006,2007,2008    x 4  . Wisdom tooth extraction    . Cholecystectomy    . Dilitation & currettage/hystroscopy with hydrothermal ablation N/A 03/16/2014    Procedure: DILATATION & CURETTAGE/HYSTEROSCOPY WITH HYDROTHERMAL ABLATION;  Surgeon: Shelly Bombard, MD;  Location: [facility];  Service: Gynecology;  Laterality: N/A;   Family History  Problem Relation Age of Onset  . Heart disease Mother   . Heart disease Maternal Grandmother    Social History  Substance Use Topics  . Smoking status: Current Some Day Smoker -- 0.50 packs/day for 3 years    Types: Cigarettes  . Smokeless tobacco: Never Used  . Alcohol Use: No   OB History    No data available     Review of Systems  Constitutional: Negative for fever and chills.  Genitourinary: Negative for vaginal discharge.   Skin:       + abscess   Allergies  Coconut flavor; Ampicillin; and Latex  Home Medications   Prior to Admission medications   Medication Sig Start Date End Date Taking? Authorizing Provider  albuterol (PROVENTIL HFA;VENTOLIN HFA) 108 (90 BASE) MCG/ACT inhaler Inhale 2 puffs into the lungs every 6 (six) hours as needed. For shortness of breath.    Historical Provider, MD  ARIPiprazole (ABILIFY) 20 MG tablet Take 20 mg by mouth daily.    Historical Provider, MD  azithromycin (ZITHROMAX) 250 MG tablet Take 1 tablet (250 mg total) by mouth daily. Take first 2 tablets together, then 1 every day until finished. 09/26/14   Waynetta Pean, PA-C  beclomethasone (QVAR) 40 MCG/ACT inhaler Inhale 1 puff into the lungs daily as needed (shortness of breath).     Historical Provider, MD  cetirizine (ZYRTEC ALLERGY) 10 MG tablet Take 1 tablet (10 mg total) by mouth daily. 09/26/14   Waynetta Pean, PA-C  chlorhexidine (HIBICLENS) 4 % external liquid Apply topically daily as needed. 04/08/15   Margarita Mail, PA-C  clindamycin (CLEOCIN) 300 MG capsule Take 1 capsule (300 mg total) by mouth 3 (three) times daily. Patient not taking: Reported on 07/26/2014 04/11/14   Shelly Bombard, MD  fluticasone [facility]) 50 MCG/ACT nasal spray Place 2 sprays into both  nostrils daily. 09/26/14   Waynetta Pean, PA-C  ibuprofen (ADVIL,MOTRIN) 600 MG tablet Take 1 tablet (600 mg total) by mouth every 6 (six) hours as needed. 02/18/15   Marella Chimes, PA-C  loratadine-pseudoephedrine (CLARITIN-D 24-HOUR) 10-240 MG per 24 hr tablet Take 1 tablet by mouth daily as needed for allergies.     Historical Provider, MD  metroNIDAZOLE (FLAGYL) 500 MG tablet Take 1 tablet (500 mg total) by mouth 2 (two) times daily. 04/08/15   Margarita Mail, PA-C  naproxen (NAPROSYN) 250 MG tablet Take 1 tablet (250 mg total) by mouth 2 (two) times daily with a meal. 09/26/14   Waynetta Pean, PA-C  oxyCODONE-acetaminophen (ROXICET) 5-325 MG per tablet  Take 1-2 tablets by mouth every 4 (four) hours as needed for moderate pain or severe pain. Patient not taking: Reported on 04/11/2014 03/16/14   Shelly Bombard, MD  Prenat w/o A Vit-FeFum-FePo-FA (CONCEPT OB) 130-92.4-1 MG CAPS Take 1 capsule by mouth daily. Patient not taking: Reported on 07/26/2014 04/11/14   Shelly Bombard, MD  tinidazole (TINDAMAX) 500 MG tablet Take 4 tablets (2,000 mg total) by mouth once. Patient not taking: Reported on 07/26/2014 04/16/14   Shelly Bombard, MD  traMADol (ULTRAM) 50 MG tablet Take 1 tablet (50 mg total) by mouth every 6 (six) hours as needed. 04/08/15   Margarita Mail, PA-C  traZODone (DESYREL) 100 MG tablet Take 100 mg by mouth at bedtime.    Historical Provider, MD  triamcinolone cream (KENALOG) 0.1 % Apply 1 application topically as needed (rash/itching).     Historical Provider, MD   BP 118/61 mmHg  Pulse 70  Temp(Src) 99.2 F (37.3 C)  Resp 16  SpO2 100%   Physical Exam  Constitutional: She is oriented to person, place, and time. She appears well-developed and well-nourished.  HENT:  Head: Normocephalic and atraumatic.  Cardiovascular: Normal rate.   Pulmonary/Chest: Effort normal.  Abdominal: She exhibits no distension.  Genitourinary:  4 cm left labial abscess that is fluctuant and TTP  Neurological: She is alert and oriented to person, place, and time.  Skin: Skin is warm and dry.  Psychiatric: She has a normal mood and affect.  Nursing note and vitals reviewed.  ED Course  Procedures   INCISION AND DRAINAGE PROCEDURE NOTE: Patient identification was confirmed and verbal consent was obtained. This procedure was performed by Margarita Mail, PA-C at 10:42 PM. Site: left labia Sterile procedures observed Anesthetic used (type and amt): lidocaine 1% with bicarbonate Blade size: 11 Drainage: moderate Complexity: simple Site anesthetized, incision made over site, wound drained and explored loculations, rinsed with copious amounts of  normal saline, wound packed with sterile gauze, covered with dry, sterile dressing.  Pt tolerated procedure well without complications.  Instructions for care discussed verbally and pt provided with additional written instructions for homecare and f/u.   DIAGNOSTIC STUDIES: Oxygen Saturation is 99% on room air, normal by my interpretation.    COORDINATION OF CARE: 9:35 PM Will before I&D of abscess and give sodium bicarbonate. Discussed treatment plan with pt at bedside and pt agreed to plan.  Labs Review Labs Reviewed - No data to display  Imaging Review No results found.    EKG Interpretation None      MDM   Final diagnoses:  Left genital labial abscess  Vaginal discharge   Patient with skin abscess amenable to incision and drainage.  Abscess was not large enough to warrant packing or drain,  wound recheck in 2 days.  Encouraged home warm soaks and flushing.  Mild signs of cellulitis is surrounding skin.  Will d/c to home.  No antibiotic therapy is indicated. I personally performed the services described in this documentation, which was scribed in my presence. The recorded information has been reviewed and is accurate.     Margarita Mail, PA-C 04/10/15 1926  Julianne Rice, MD 04/13/15 [phone]

## 2015-04-08 NOTE — ED Notes (Signed)
Called for a third time out in the lobby and bathrooms, no response

## 2015-04-08 NOTE — ED Notes (Signed)
Pt pt, hx of vaginal abscess.  This abscess is on vaginal lip x 1 week.

## 2015-04-08 NOTE — ED Notes (Signed)
Attempted to call for patient a third time in the lobby, bathroom, and outside, no response. Will take patient off the track board.

## 2015-04-08 NOTE — Discharge Instructions (Signed)
Incision and Drainage Incision and drainage is a procedure in which a sac-like structure (cystic structure) is opened and drained. The area to be drained usually contains material such as pus, fluid, or blood.  LET YOUR CAREGIVER KNOW ABOUT:   Allergies to medicine.  Medicines taken, including vitamins, herbs, eyedrops, over-the-counter medicines, and creams.  Use of steroids (by mouth or creams).  Previous problems with anesthetics or numbing medicines.  History of bleeding problems or blood clots.  Previous surgery.  Other health problems, including diabetes and kidney problems.  Possibility of pregnancy, if this applies. RISKS AND COMPLICATIONS  Pain.  Bleeding.  Scarring.  Infection. BEFORE THE PROCEDURE  You may need to have an ultrasound or other imaging tests to see how large or deep your cystic structure is. Blood tests may also be used to determine if you have an infection or how severe the infection is. You may need to have a tetanus shot. PROCEDURE  The affected area is cleaned with a cleaning fluid. The cyst area will then be numbed with a medicine (local anesthetic). A small incision will be made in the cystic structure. A syringe or catheter may be used to drain the contents of the cystic structure, or the contents may be squeezed out. The area will then be flushed with a cleansing solution. After cleansing the area, it is often gently packed with a gauze or another wound dressing. Once it is packed, it will be covered with gauze and tape or some other type of wound dressing. AFTER THE PROCEDURE   Often, you will be allowed to go home right after the procedure.  You may be given antibiotic medicine to prevent or heal an infection.  If the area was packed with gauze or some other wound dressing, you will likely need to come back in 1 to 2 days to get it removed.  The area should heal in about 14 days.   This information is not intended to replace advice given  to you by your health care provider. Make sure you discuss any questions you have with your health care provider.   Document Released: 08/12/2000 Document Revised: 08/18/2011 Document Reviewed: 04/13/2011 Elsevier Interactive Patient Education 2016 Elsevier Inc.  Boric Acid Suppositories (available at [employer]) 1 suppository in the vagina at bedtime 3 times during the first week. Then 1 a week thereafter.

## 2015-04-08 NOTE — ED Notes (Signed)
Pt c/o abscess to left vaginal labia x 1 week. Hx of cysts that have been drained, and also has had surgery to have area removed in the past.

## 2015-04-08 NOTE — ED Notes (Signed)
Patient decided she had waited too long and wanted to leave. This was her second visit today. She left once earlier to go get food instead of waiting.

## 2015-04-08 NOTE — ED Notes (Signed)
Pt here with c/o abscess to groin, onset one week ago and reports it is getting larger.

## 2015-04-08 NOTE — ED Notes (Signed)
Pt called for twice out in lobby and bathrooms, no response

## 2015-10-26 ENCOUNTER — Emergency Department (HOSPITAL_COMMUNITY)
Admission: EM | Admit: 2015-10-26 | Discharge: 2015-10-27 | Disposition: A | Discharge status: Home / Self Care | Payer: Medicaid Other | Attending: Emergency Medicine | Admitting: Emergency Medicine

## 2015-10-26 ENCOUNTER — Encounter (HOSPITAL_COMMUNITY): Payer: Self-pay | Admitting: Emergency Medicine

## 2015-10-26 DIAGNOSIS — F17200 Nicotine dependence, unspecified, uncomplicated: Secondary | ICD-10-CM | POA: Insufficient documentation

## 2015-10-26 DIAGNOSIS — Y92410 Unspecified street and highway as the place of occurrence of the external cause: Secondary | ICD-10-CM | POA: Insufficient documentation

## 2015-10-26 DIAGNOSIS — R51 Headache: Secondary | ICD-10-CM | POA: Insufficient documentation

## 2015-10-26 DIAGNOSIS — J45909 Unspecified asthma, uncomplicated: Secondary | ICD-10-CM | POA: Diagnosis not present

## 2015-10-26 DIAGNOSIS — Y939 Activity, unspecified: Secondary | ICD-10-CM | POA: Diagnosis not present

## 2015-10-26 DIAGNOSIS — Z79899 Other long term (current) drug therapy: Secondary | ICD-10-CM | POA: Insufficient documentation

## 2015-10-26 DIAGNOSIS — Y999 Unspecified external cause status: Secondary | ICD-10-CM | POA: Diagnosis not present

## 2015-10-26 DIAGNOSIS — R519 Headache, unspecified: Secondary | ICD-10-CM

## 2015-10-26 MED ORDER — ACETAMINOPHEN 500 MG PO TABS
1000.0000 mg | ORAL_TABLET | Freq: Once | ORAL | Status: AC
  Administered 2015-10-26: 1000 mg via ORAL
  Filled 2015-10-26: qty 2
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
## 2015-10-26 NOTE — ED Notes (Signed)
PA at the bedside.
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
## 2015-10-27 ENCOUNTER — Emergency Department (HOSPITAL_COMMUNITY): Payer: Medicaid Other
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
## 2015-10-27 NOTE — ED Notes (Signed)
Patient able to ambulate independently  

## 2016-03-02 HISTORY — PX: FOOT SURGERY: SHX648

## 2016-05-28 ENCOUNTER — Ambulatory Visit: Payer: Medicaid Other | Admitting: Obstetrics & Gynecology

## 2016-06-09 ENCOUNTER — Ambulatory Visit: Payer: Medicaid Other | Admitting: Obstetrics and Gynecology

## 2016-06-29 ENCOUNTER — Encounter: Payer: Self-pay | Admitting: Obstetrics and Gynecology

## 2016-06-29 ENCOUNTER — Ambulatory Visit (INDEPENDENT_AMBULATORY_CARE_PROVIDER_SITE_OTHER): Payer: Medicaid Other | Admitting: Obstetrics and Gynecology

## 2016-06-29 VITALS — BP 105/68 | HR 72 | Ht 61.0 in | Wt 137.5 lb

## 2016-06-29 DIAGNOSIS — N938 Other specified abnormal uterine and vaginal bleeding: Secondary | ICD-10-CM | POA: Diagnosis not present

## 2016-06-29 MED ORDER — MEDROXYPROGESTERONE ACETATE 10 MG PO TABS: 20.0000 mg | ORAL_TABLET | Freq: Every day | ORAL | 12 refills | Status: DC

## 2016-06-29 NOTE — Progress Notes (Signed)
Patient is following up after ablation, Patient states that she gets 2 periods a month where she bleeds for 7 days, last LMP was last week.

## 2016-06-29 NOTE — Progress Notes (Signed)
31 yo P1 presenting today for the evaluation of menorrhagia. Patient had an endometrial ablation in 2016 due to heavy vaginal bleeding. She reports a monthly period lasting 3 days since the ablation until 6 months ago when she started to experience a second period lasting 7 days 2 weeks later. She denies any heavy flow or passage of clots. She denies any pelvic pain. She is sexually active in a same sex relationship  Past Medical History:  Diagnosis Date  . Allergy   . Anemia   . Anxiety   . Asthma   . Bipolar disorder ([place])    Bipolar 2  . Cigarette smoker   . Depression   . Insomnia   . Migraine    Past Surgical History:  Procedure Laterality Date  . CESAREAN SECTION  2004,2006,2007,2008   x 4  . CHOLECYSTECTOMY    . DILITATION & CURRETTAGE/HYSTROSCOPY WITH HYDROTHERMAL ABLATION N/A 03/16/2014   Procedure: DILATATION & CURETTAGE/HYSTEROSCOPY WITH HYDROTHERMAL ABLATION;  Surgeon: Shelly Bombard, MD;  Location: [facility];  Service: Gynecology;  Laterality: N/A;  . TUBAL LIGATION    . WISDOM TOOTH EXTRACTION     Family History  Problem Relation Age of Onset  . Heart disease Mother   . Heart disease Maternal Grandmother    Social History  Substance Use Topics  . Smoking status: Current Some Day Smoker    Packs/day: 0.50    Years: 3.00    Types: Cigarettes  . Smokeless tobacco: Never Used  . Alcohol use No   ROS See pertinent in HPI Blood pressure 105/68, pulse 72, height 5\' 1"  (1.549 m), weight 137 lb 8 oz (62.4 kg), last menstrual period 06/22/2016. GENERAL: Well-developed, well-nourished female in no acute distress.  ABDOMEN: Soft, nontender, nondistended. No organomegaly. PELVIC: patient declined EXTREMITIES: No cyanosis, clubbing, or edema, 2+ distal pulses.  A/P 31 yo with DUB s/p endometrial ablation - Rx provera provided - Pelvic ultrasound ordered - TSH and CBC ordered - patient will be contacted with results and management plan. She is not interested in medical  management with contraception

## 2016-06-30 LAB — CBC
HEMOGLOBIN: 15.9 g/dL (ref 11.1–15.9)
Hematocrit: 45.7 % (ref 34.0–46.6)
MCH: 32.6 pg (ref 26.6–33.0)
MCHC: 34.8 g/dL (ref 31.5–35.7)
MCV: 94 fL (ref 79–97)
Platelets: 216 10*3/uL (ref 150–379)
RBC: 4.88 x10E6/uL (ref 3.77–5.28)
RDW: 13.1 % (ref 12.3–15.4)
WBC: 8.2 10*3/uL (ref 3.4–10.8)

## 2016-06-30 LAB — TSH: TSH: 0.79 u[IU]/mL (ref 0.450–4.500)

## 2016-07-09 ENCOUNTER — Ambulatory Visit (HOSPITAL_COMMUNITY)
Admission: RE | Admit: 2016-07-09 | Discharge: 2016-07-09 | Disposition: A | Discharge status: Home / Self Care | Payer: Managed Care, Other (non HMO) | Source: Ambulatory Visit | Attending: Obstetrics and Gynecology | Admitting: Obstetrics and Gynecology

## 2016-07-09 DIAGNOSIS — N938 Other specified abnormal uterine and vaginal bleeding: Secondary | ICD-10-CM | POA: Insufficient documentation

## 2016-07-09 DIAGNOSIS — N83202 Unspecified ovarian cyst, left side: Secondary | ICD-10-CM | POA: Diagnosis not present

## 2016-07-09 DIAGNOSIS — D250 Submucous leiomyoma of uterus: Secondary | ICD-10-CM | POA: Insufficient documentation

## 2016-07-10 ENCOUNTER — Telehealth: Payer: Self-pay | Admitting: *Deleted

## 2016-07-10 NOTE — Telephone Encounter (Signed)
-----   Message from Mora Bellman, MD sent at 07/10/2016  9:14 AM EDT ----- Please inform patient of normal ultrasound with the presence of a left ovarian hemorrhagic cyst. This cyst is very common and will resolve spontaneously. However, its presence can explain the abnormal bleeding pattern you experienced recently. I would encourage you to continue taking provera for the next 6 months and evaluate your periods after the provera trial  Thanks  Peggy

## 2016-07-10 NOTE — Telephone Encounter (Signed)
Reviewed [place] result with patient and provider recommendation. She will continue the provera and follow up in 6 months.

## 2016-07-14 ENCOUNTER — Ambulatory Visit (INDEPENDENT_AMBULATORY_CARE_PROVIDER_SITE_OTHER): Payer: Medicaid Other

## 2016-07-14 ENCOUNTER — Encounter: Payer: Self-pay | Admitting: Sports Medicine

## 2016-07-14 ENCOUNTER — Ambulatory Visit (INDEPENDENT_AMBULATORY_CARE_PROVIDER_SITE_OTHER): Payer: Medicaid Other | Admitting: Sports Medicine

## 2016-07-14 DIAGNOSIS — M2041 Other hammer toe(s) (acquired), right foot: Secondary | ICD-10-CM | POA: Diagnosis not present

## 2016-07-14 DIAGNOSIS — M722 Plantar fascial fibromatosis: Secondary | ICD-10-CM | POA: Diagnosis not present

## 2016-07-14 DIAGNOSIS — M79674 Pain in right toe(s): Secondary | ICD-10-CM

## 2016-07-14 DIAGNOSIS — M2141 Flat foot [pes planus] (acquired), right foot: Secondary | ICD-10-CM | POA: Diagnosis not present

## 2016-07-14 DIAGNOSIS — M79672 Pain in left foot: Secondary | ICD-10-CM

## 2016-07-14 DIAGNOSIS — M2142 Flat foot [pes planus] (acquired), left foot: Secondary | ICD-10-CM

## 2016-07-14 DIAGNOSIS — M2040 Other hammer toe(s) (acquired), unspecified foot: Secondary | ICD-10-CM

## 2016-07-14 DIAGNOSIS — L84 Corns and callosities: Secondary | ICD-10-CM

## 2016-07-14 MED ORDER — METHYLPREDNISOLONE 4 MG PO TBPK: ORAL_TABLET | ORAL | 0 refills | Status: DC

## 2016-07-14 NOTE — Progress Notes (Signed)
Subjective: Diane Garrison is a 31 y.o. female patient presents to office with complaint of pain at Right 5th toe with callus in between that she had surgery on 2 summers ago with continued pain and no relief with trimming the callus. Patient reports that pain is worse with certain shoes and with long shifts at work. Patient also complains of arch pain on the left for 6 months. Patient has treated this problem with changing shoes with no relief. Denies any other pedal complaints.   Patient Active Problem List   Diagnosis Date Noted  . Excessive or frequent menstruation 02/13/2013  . Abnormal urine finding 02/13/2013  . Surveillance of other previously prescribed contraceptive method 02/13/2013    Current Outpatient Prescriptions on File Prior to Visit  Medication Sig Dispense Refill  . albuterol (PROVENTIL HFA;VENTOLIN HFA) 108 (90 BASE) MCG/ACT inhaler Inhale 2 puffs into the lungs every 6 (six) hours as needed. For shortness of breath.    . ARIPiprazole (ABILIFY) 20 MG tablet Take 20 mg by mouth daily.    Marland Kitchen azithromycin (ZITHROMAX) 250 MG tablet Take 1 tablet (250 mg total) by mouth daily. Take first 2 tablets together, then 1 every day until finished. (Patient not taking: Reported on 06/29/2016) 6 tablet 0  . beclomethasone (QVAR) 40 MCG/ACT inhaler Inhale 1 puff into the lungs daily as needed (shortness of breath).     . cetirizine (ZYRTEC ALLERGY) 10 MG tablet Take 1 tablet (10 mg total) by mouth daily. (Patient not taking: Reported on 06/29/2016) 30 tablet 1  . chlorhexidine (HIBICLENS) 4 % external liquid Apply topically daily as needed. (Patient not taking: Reported on 06/29/2016) 120 mL 0  . fluticasone (FLONASE) 50 MCG/ACT nasal spray Place 2 sprays into both nostrils daily. (Patient not taking: Reported on 06/29/2016) 16 g 1  . ibuprofen (ADVIL,MOTRIN) 600 MG tablet Take 1 tablet (600 mg total) by mouth every 6 (six) hours as needed. (Patient not taking: Reported on 06/29/2016) 30  tablet 0  . loratadine-pseudoephedrine (CLARITIN-D 24-HOUR) 10-240 MG per 24 hr tablet Take 1 tablet by mouth daily as needed for allergies.     . medroxyPROGESTERone (PROVERA) 10 MG tablet Take 2 tablets (20 mg total) by mouth daily. 30 tablet 12  . naproxen (NAPROSYN) 250 MG tablet Take 1 tablet (250 mg total) by mouth 2 (two) times daily with a meal. (Patient not taking: Reported on 06/29/2016) 30 tablet 0  . Prenat w/o A Vit-FeFum-FePo-FA (CONCEPT OB) 130-92.4-1 MG CAPS Take 1 capsule by mouth daily. (Patient not taking: Reported on 07/26/2014) 30 capsule 11  . tinidazole (TINDAMAX) 500 MG tablet Take 4 tablets (2,000 mg total) by mouth once. (Patient not taking: Reported on 07/26/2014) 4 tablet 1  . traMADol (ULTRAM) 50 MG tablet Take 1 tablet (50 mg total) by mouth every 6 (six) hours as needed. (Patient not taking: Reported on 06/29/2016) 15 tablet 0  . traZODone (DESYREL) 100 MG tablet Take 100 mg by mouth at bedtime.    . triamcinolone cream (KENALOG) 0.1 % Apply 1 application topically as needed (rash/itching).      No current facility-administered medications on file prior to visit.     Allergies  Allergen Reactions  . Coconut Flavor   . Ampicillin Rash  . Latex Rash    Objective: Physical Exam General: The patient is alert and oriented x3 in no acute distress.  Dermatology: Skin is warm, dry and supple bilateral lower extremities. Nails 1-10 are normal. There is no erythema, edema,  no eccymosis, no open lesions present. There is mild callusing in the fourth webspace on the right foot. Integument is otherwise unremarkable.  Vascular: Dorsalis Pedis pulse and Posterior Tibial pulse are 2/4 bilateral. Capillary fill time is immediate to all digits.  Neurological: Grossly intact to light touch with an achilles reflex of +2/5 and a  negative Tinel's sign bilateral.  Musculoskeletal: There is tenderness with palpation to the right fifth hammertoe and to the callus lesion in between  the toes. Tenderness to palpation at the plantar arch through the insertion of the plantar fascia on the left foot. No pain with compression of calcaneus bilateral. No pain with tuning fork to calcaneus bilateral. No pain with calf compression bilateral. There is decreased Ankle joint range of motion bilateral. All other joints range of motion within normal limits bilateral. Strength 5/5 in all groups bilateral. Pes planus foot type bilateral.   Xray, Right/Left foot:  Normal osseous mineralization. Joint spaces preserved. No fracture/dislocation/boney destruction. Calcaneal spur present with mild thickening of plantar fascia on the left. There is evidence of increased space, likely arthroplasty on the left fifth toe, however patient denies surgery to this area. There is mild hammertoe bilateral. There is pes planus deformity, bilateral. No other soft tissue abnormalities or radiopaque foreign bodies.   Assessment and Plan: Problem List Items Addressed This Visit    None    Visit Diagnoses    Toe pain, right    -  Primary   Relevant Orders   DG Foot Complete Right (Completed)   Foot pain, left       Relevant Orders   DG Foot 2 Views Left (Completed)   Hammer toe of right foot       Foot callus       Plantar fasciitis of left foot       Relevant Medications   methylPREDNISolone (MEDROL DOSEPAK) 4 MG TBPK tablet   Pes planus of both feet          -Complete examination performed.  -Xrays reviewed -Discussed with patient in detail the condition of plantar fasciitis, how this occurs and general treatment options. Explained both conservative and surgical treatments.  -No injection at this time, due to mid arch symptoms on left -Prescribed Medrol Dosepak to take as instructed for pain and inflammation -Recommended good supportive shoes and advised use of OTC insert. -Explained and dispensed to patient daily stretching exercises. -Recommend patient to ice affected area 1-2x daily. -Patient  opt for surgical management of right fifth hammertoe and callus in between toes. Consent obtained for right fifth hammertoe repair and excision of benign lesion interdigitally. Pre and Post op course explained. Risks, benefits, alternatives explained. No guarantees given or implied. Surgical booking slip submitted and provided patient with Surgical packet and info for [facility]. -Advised patient that she would have to take at least 1 month off from work to completely give time for these areas to heal. Advised patient to get to our office, Her medical leave paperwork to be completed on her behalf. -To dispense postop shoe at surgical center -Patient to return to office after surgery for follow up or sooner if problems or questions arise.  Landis Martins, DPM

## 2016-07-14 NOTE — Patient Instructions (Signed)
Pre-Operative Instructions  Congratulations, you have decided to take an important step to improving your quality of life.  You can be assured that the doctors of [facility] will be with you every step of the way.  1. Plan to be at the surgery center/hospital at least 1 (one) hour prior to your scheduled time unless otherwise directed by the surgical center/hospital staff.  You must have a responsible adult accompany you, remain during the surgery and drive you home.  Make sure you have directions to the surgical center/hospital and know how to get there on time. 2. For hospital based surgery you will need to obtain a history and physical form from your family physician within 1 month prior to the date of surgery- we will give you a form for you primary physician.  3. We make every effort to accommodate the date you request for surgery.  There are however, times where surgery dates or times have to be moved.  We will contact you as soon as possible if a change in schedule is required.   4. No Aspirin/Ibuprofen for one week before surgery.  If you are on aspirin, any non-steroidal anti-inflammatory medications (Mobic, Aleve, Ibuprofen) you should stop taking it 7 days prior to your surgery.  You make take Tylenol  For pain prior to surgery.  5. Medications- If you are taking daily heart and blood pressure medications, seizure, reflux, allergy, asthma, anxiety, pain or diabetes medications, make sure the surgery center/hospital is aware before the day of surgery so they may notify you which medications to take or avoid the day of surgery. 6. No food or drink after midnight the night before surgery unless directed otherwise by surgical center/hospital staff. 7. No alcoholic beverages 24 hours prior to surgery.  No smoking 24 hours prior to or 24 hours after surgery. 8. Wear loose pants or shorts- loose enough to fit over bandages, boots, and casts. 9. No slip on shoes, sneakers are best. 10. Bring  your boot with you to the surgery center/hospital.  Also bring crutches or a walker if your physician has prescribed it for you.  If you do not have this equipment, it will be provided for you after surgery. 11. If you have not been contracted by the surgery center/hospital by the day before your surgery, call to confirm the date and time of your surgery. 12. Leave-time from work may vary depending on the type of surgery you have.  Appropriate arrangements should be made prior to surgery with your employer. 13. Prescriptions will be provided immediately following surgery by your doctor.  Have these filled as soon as possible after surgery and take the medication as directed. 14. Remove nail polish on the operative foot. 15. Wash the night before surgery.  The night before surgery wash the foot and leg well with the antibacterial soap provided and water paying special attention to beneath the toenails and in between the toes.  Rinse thoroughly with water and dry well with a towel.  Perform this wash unless told not to do so by your physician.  Enclosed: 1 Ice pack (please put in freezer the night before surgery)   1 Hibiclens skin cleaner   Pre-op Instructions  If you have any questions regarding the instructions, do not hesitate to call our office.  [place]: [address] [place], [place] [address] [phone]  [place]: [address] [place], [place] [address] [phone]  [place]: [address]  [place], [place] [address] [phone]   Dr.   Norman Regal DPM, Dr. Matthew Wagoner DPM, Dr. M. Todd Hyatt DPM, Dr. Titorya Stover DPM 

## 2016-07-15 ENCOUNTER — Ambulatory Visit: Payer: Medicaid Other | Admitting: Obstetrics and Gynecology

## 2016-07-28 ENCOUNTER — Ambulatory Visit (INDEPENDENT_AMBULATORY_CARE_PROVIDER_SITE_OTHER): Payer: Managed Care, Other (non HMO) | Admitting: Obstetrics and Gynecology

## 2016-07-28 VITALS — BP 112/72 | HR 73 | Ht 61.0 in | Wt 137.0 lb

## 2016-07-28 DIAGNOSIS — N938 Other specified abnormal uterine and vaginal bleeding: Secondary | ICD-10-CM | POA: Diagnosis not present

## 2016-07-28 MED ORDER — MEGESTROL ACETATE 40 MG PO TABS: 40.0000 mg | ORAL_TABLET | Freq: Two times a day (BID) | ORAL | 5 refills | Status: DC

## 2016-07-28 NOTE — Patient Instructions (Signed)
Vaginal Hysterectomy A vaginal hysterectomy is a procedure to remove all or part of the uterus through a small incision in the vagina. In this procedure, your health care provider may remove your entire uterus, including the lower end (cervix). You may need a vaginal hysterectomy to treat:  Uterine fibroids.  A condition that causes the lining of the uterus to grow in other areas (endometriosis).  Problems with pelvic support.  Cancer of the cervix, ovaries, uterus, or tissue that lines the uterus (endometrium).  Excessive (dysfunctional) uterine bleeding. When removing your uterus, your health care provider may also remove the organs that produce eggs (ovaries) and the tubes that carry eggs to your uterus (fallopian tubes). After a vaginal hysterectomy, you will no longer be able to have a baby. You will also no longer get your menstrual period. Tell a health care provider about:  Any allergies you have.  All medicines you are taking, including vitamins, herbs, eye drops, creams, and over-the-counter medicines.  Any problems you or family members have had with anesthetic medicines.  Any blood disorders you have.  Any surgeries you have had.  Any medical conditions you have.  Whether you are pregnant or may be pregnant. What are the risks? Generally, this is a safe procedure. However, problems may occur, including:  Bleeding.  Infection.  A blood clot that forms in your leg and travels to your lungs (pulmonary embolism).  Damage to surrounding organs.  Pain during sex. What happens before the procedure?  Ask your health care provider what organs will be removed during surgery.  Ask your health care provider about:  Changing or stopping your regular medicines. This is especially important if you are taking diabetes medicines or blood thinners.  Taking medicines such as aspirin and ibuprofen. These medicines can thin your blood. Do not take these medicines before your  procedure if your health care provider instructs you not to.  Follow instructions from your health care provider about eating or drinking restrictions.  Do not use any tobacco products, such as cigarettes, chewing tobacco, and e-cigarettes. If you need help quitting, ask your health care provider.  Plan to have someone take you home after discharge from the hospital. What happens during the procedure?  To reduce your risk of infection:  Your health care team will wash or sanitize their hands.  Your skin will be washed with soap.  An IV tube will be inserted into one of your veins.  You may be given antibiotic medicine to help prevent infection.  You will be given one or more of the following:  A medicine to help you relax (sedative).  A medicine to numb the area (local anesthetic).  A medicine to make you fall asleep (general anesthetic).  A medicine that is injected into an area of your body to numb everything beyond the injection site (regional anesthetic).  Your surgeon will make an incision in your vagina.  Your surgeon will locate and remove all or part of your uterus.  Your ovaries and fallopian tubes may be removed at the same time.  The incision will be closed with stitches (sutures) that dissolve over time. The procedure may vary among health care providers and hospitals. What happens after the procedure?  Your blood pressure, heart rate, breathing rate, and blood oxygen level will be monitored often until the medicines you were given have worn off.  You will be encouraged to get up and walk around after a few hours to help prevent complications.  You may have IV tubes in place for a few days.  You will be given pain medicine as needed.  Do not drive for 24 hours if you were given a sedative. This information is not intended to replace advice given to you by your health care provider. Make sure you discuss any questions you have with your health care  provider. Document Released: 06/10/2015 Document Revised: 07/25/2015 Document Reviewed: 03/03/2015 Elsevier Interactive Patient Education  2017 [employer].

## 2016-07-28 NOTE — Progress Notes (Signed)
Pt presents for f/u u/s results. Pt still having vaginal bleeding after taking Medroxyprogesterone. Pt also c/o irritated bump on vulva.    Pt previously seen for DUB. Has tried OCP's in the past to no avail. H/O endometrial ablation in 2016. Last 6 months 2 cycles a month. U/S small submucosal fibroid.  Was started on Provera but continues to bleed  H/O C section x 4.  PE AF VSS Pelvic: small folliculitis , uterus small < 8 weeks mobile, non tender  ENDOMETRIAL BIOPSY     The indications for endometrial biopsy were reviewed.   Risks of the biopsy including cramping, bleeding, infection, uterine perforation, inadequate specimen and need for additional procedures  were discussed. The patient states she understands and agrees to undergo procedure today. Consent was signed. Time out was performed. H/O BTL During the pelvic exam, the cervix was prepped with Betadine. A single-toothed tenaculum was placed on the anterior lip of the cervix to stabilize it. Yellow uterine dilator used to sound and dilate cervix. Sounded to @ 8 cm.The 3 mm pipelle was introduced into the endometrial cavity without difficulty to a depth of 8cm, and a moderate amount of tissue was obtained and sent to pathology. The instruments were removed from the patient's vagina. Minimal bleeding from the cervix was noted. The patient tolerated the procedure well. Routine post-procedure instructions were given to the patient.     A/P DUB Will d/c Provera and start Megace. EMBX completed Pt desires definitive treatment. TVH with BS reviewed with pt. R/B/Post op care reviewed. Information provided. Will await EMBX results before scheduling. Pt to follow up per BX or post op.

## 2016-08-03 ENCOUNTER — Encounter: Payer: Self-pay | Admitting: Sports Medicine

## 2016-08-03 DIAGNOSIS — D490 Neoplasm of unspecified behavior of digestive system: Secondary | ICD-10-CM | POA: Diagnosis not present

## 2016-08-03 DIAGNOSIS — M2041 Other hammer toe(s) (acquired), right foot: Secondary | ICD-10-CM

## 2016-08-04 ENCOUNTER — Telehealth: Payer: Self-pay | Admitting: Sports Medicine

## 2016-08-04 NOTE — Telephone Encounter (Signed)
Post op check phone call made to patient. Patient answered the phone but then phone call was dropped. I was calling to check on patient however phone call was dropped. Patient to follow up as scheduled for continued post op care. -Dr. Cannon Kettle

## 2016-08-06 NOTE — Progress Notes (Signed)
Right 5th hammertoe repair (remove bone from 5th toe) and excise or cut out corn/callus in between toes.

## 2016-08-11 ENCOUNTER — Ambulatory Visit (INDEPENDENT_AMBULATORY_CARE_PROVIDER_SITE_OTHER): Payer: Self-pay | Admitting: Sports Medicine

## 2016-08-11 ENCOUNTER — Ambulatory Visit (INDEPENDENT_AMBULATORY_CARE_PROVIDER_SITE_OTHER): Payer: Managed Care, Other (non HMO)

## 2016-08-11 ENCOUNTER — Encounter: Payer: Self-pay | Admitting: Sports Medicine

## 2016-08-11 DIAGNOSIS — M2041 Other hammer toe(s) (acquired), right foot: Secondary | ICD-10-CM

## 2016-08-11 DIAGNOSIS — Z9889 Other specified postprocedural states: Secondary | ICD-10-CM

## 2016-08-11 DIAGNOSIS — M79674 Pain in right toe(s): Secondary | ICD-10-CM

## 2016-08-11 NOTE — Progress Notes (Signed)
Subjective: Diane Garrison is a 31 y.o. female patient seen today in office for POV #1 (DOS 08-03-16), S/P Right 5th hammertoe repair with excision of corn at 4th webspace. Patient denies pain at surgical site, denies calf pain, denies headache, chest pain, shortness of breath, nausea, vomiting, fever, or chills. Patient states that she is doing well and has not required to take any of her medications; have been "toughing" out the occasional pain. No other issues noted.   Patient Active Problem List   Diagnosis Date Noted  . Excessive or frequent menstruation 02/13/2013    Current Outpatient Prescriptions on File Prior to Visit  Medication Sig Dispense Refill  . albuterol (PROVENTIL HFA;VENTOLIN HFA) 108 (90 BASE) MCG/ACT inhaler Inhale 2 puffs into the lungs every 6 (six) hours as needed. For shortness of breath.    . ARIPiprazole (ABILIFY) 20 MG tablet Take 20 mg by mouth daily.    . beclomethasone (QVAR) 40 MCG/ACT inhaler Inhale 1 puff into the lungs daily as needed (shortness of breath).     . cetirizine (ZYRTEC ALLERGY) 10 MG tablet Take 1 tablet (10 mg total) by mouth daily. (Patient not taking: Reported on 06/29/2016) 30 tablet 1  . docusate sodium (COLACE) 100 MG capsule Take 100 mg by mouth 2 (two) times daily as needed for mild constipation.    . fluticasone (FLONASE) 50 MCG/ACT nasal spray Place 2 sprays into both nostrils daily. (Patient not taking: Reported on 06/29/2016) 16 g 1  . ibuprofen (ADVIL,MOTRIN) 600 MG tablet Take 1 tablet (600 mg total) by mouth every 6 (six) hours as needed. (Patient not taking: Reported on 06/29/2016) 30 tablet 0  . loratadine-pseudoephedrine (CLARITIN-D 24-HOUR) 10-240 MG per 24 hr tablet Take 1 tablet by mouth daily as needed for allergies.     . megestrol (MEGACE) 40 MG tablet Take 1 tablet (40 mg total) by mouth 2 (two) times daily. Can increase to two tablets twice a day in the event of heavy bleeding 60 tablet 5  . oxyCODONE-acetaminophen  (PERCOCET/ROXICET) 5-325 MG tablet Take 1 tablet by mouth every 8 (eight) hours as needed for severe pain.    . promethazine (PHENERGAN) 25 MG tablet Take 25 mg by mouth every 8 (eight) hours as needed for nausea or vomiting.    . traZODone (DESYREL) 100 MG tablet Take 100 mg by mouth at bedtime.     No current facility-administered medications on file prior to visit.     Allergies  Allergen Reactions  . Coconut Flavor   . Ampicillin Rash  . Latex Rash    Objective: There were no vitals filed for this visit.  General: No acute distress, AAOx3  Right foot: Sutures intact with no gapping or dehiscence at surgical sites, mild swelling to 5th toe on right foot, no erythema, no warmth, no drainage, no signs of infection noted, Capillary fill time <3 seconds in all digits, gross sensation present via light touch to right foot. No pain or crepitation with range of motion right foot.  No pain with calf compression.   Post Op Xray, Right foot: 5th toe arthroplasty. Soft tissue swelling within normal limits for post op status.   Assessment and Plan:  Problem List Items Addressed This Visit    None    Visit Diagnoses    S/P foot surgery, right    -  Primary   Hammer toe of right foot       Relevant Orders   DG Foot Complete Right (Completed)  Toe pain, right          -Patient seen and evaluated -Xrays reviewed  -Applied dry sterile dressing to surgical site right foot secured with ACE wrap and stockinet  -Advised patient to make sure to keep dressings clean, dry, and intact to right surgical site, removing the ACE as needed  -Advised patient to continue with post-op shoe on right foot   -Advised patient to limit activity to necessity  -Advised patient to ice and elevate as necessary  -Continue with PRN meds -Will plan for suture removal at next office visit. In the meantime, patient to call office if any issues or problems arise.   Landis Martins, DPM

## 2016-08-18 ENCOUNTER — Ambulatory Visit (INDEPENDENT_AMBULATORY_CARE_PROVIDER_SITE_OTHER): Payer: Self-pay | Admitting: Sports Medicine

## 2016-08-18 ENCOUNTER — Encounter: Payer: Self-pay | Admitting: Sports Medicine

## 2016-08-18 DIAGNOSIS — M2041 Other hammer toe(s) (acquired), right foot: Secondary | ICD-10-CM

## 2016-08-18 DIAGNOSIS — Z9889 Other specified postprocedural states: Secondary | ICD-10-CM

## 2016-08-18 DIAGNOSIS — M79674 Pain in right toe(s): Secondary | ICD-10-CM

## 2016-08-18 NOTE — Progress Notes (Signed)
Subjective: Diane Garrison is a 31 y.o. female patient seen today in office for POV #2 (DOS 08-03-16), S/P Right 5th hammertoe repair with excision of corn at 4th webspace. Patient denies pain at surgical site, states that she did have one day of pain, which she had to take for pain medicines after bumping her toe, denies calf pain, denies headache, chest pain, shortness of breath, nausea, vomiting, fever, or chills. No other issues noted.   Patient Active Problem List   Diagnosis Date Noted  . Excessive or frequent menstruation 02/13/2013    Current Outpatient Prescriptions on File Prior to Visit  Medication Sig Dispense Refill  . albuterol (PROVENTIL HFA;VENTOLIN HFA) 108 (90 BASE) MCG/ACT inhaler Inhale 2 puffs into the lungs every 6 (six) hours as needed. For shortness of breath.    . ARIPiprazole (ABILIFY) 20 MG tablet Take 20 mg by mouth daily.    . beclomethasone (QVAR) 40 MCG/ACT inhaler Inhale 1 puff into the lungs daily as needed (shortness of breath).     . cetirizine (ZYRTEC ALLERGY) 10 MG tablet Take 1 tablet (10 mg total) by mouth daily. (Patient not taking: Reported on 06/29/2016) 30 tablet 1  . docusate sodium (COLACE) 100 MG capsule Take 100 mg by mouth 2 (two) times daily as needed for mild constipation.    . fluticasone (FLONASE) 50 MCG/ACT nasal spray Place 2 sprays into both nostrils daily. (Patient not taking: Reported on 06/29/2016) 16 g 1  . ibuprofen (ADVIL,MOTRIN) 600 MG tablet Take 1 tablet (600 mg total) by mouth every 6 (six) hours as needed. (Patient not taking: Reported on 06/29/2016) 30 tablet 0  . loratadine-pseudoephedrine (CLARITIN-D 24-HOUR) 10-240 MG per 24 hr tablet Take 1 tablet by mouth daily as needed for allergies.     . megestrol (MEGACE) 40 MG tablet Take 1 tablet (40 mg total) by mouth 2 (two) times daily. Can increase to two tablets twice a day in the event of heavy bleeding 60 tablet 5  . oxyCODONE-acetaminophen (PERCOCET/ROXICET) 5-325 MG tablet  Take 1 tablet by mouth every 8 (eight) hours as needed for severe pain.    . promethazine (PHENERGAN) 25 MG tablet Take 25 mg by mouth every 8 (eight) hours as needed for nausea or vomiting.    . traZODone (DESYREL) 100 MG tablet Take 100 mg by mouth at bedtime.     No current facility-administered medications on file prior to visit.     Allergies  Allergen Reactions  . Coconut Flavor   . Ampicillin Rash  . Latex Rash    Objective: There were no vitals filed for this visit.  General: No acute distress, AAOx3  Right foot: Sutures intact with no gapping or dehiscence at surgical sites, However, at interspace. There is mild maceration, mild swelling to 5th toe on right foot, no erythema, no warmth, no drainage, no signs of infection noted, Capillary fill time <3 seconds in all digits, gross sensation present via light touch to right foot. No pain or crepitation with range of motion right foot.  No pain with calf compression.    Assessment and Plan:  Problem List Items Addressed This Visit    None    Visit Diagnoses    S/P foot surgery, right    -  Primary   Hammer toe of right foot       Toe pain, right          -Patient seen and evaluated -Sutures at the fifth toe removed. However, sutures  at the fourth webspace were left in place. Advised patient to redress the area using dry gauze and Ace wrap daily to help with moisture control between toes -Applied dry sterile dressing to surgical site right foot secured with ACE wrap and stockinet   -Advised patient to continue with post-op shoe on right foot  until all sutures have been removed. Then, after may start to slowly wean to a normal tennis shoe -Advised patient to limit activity to necessity  -Advised patient to ice and elevate as necessary  -Continue with PRN meds -Will plan for finishing suture removal at next office visit and then slowly letting her use a normal tennis shoe and to take a normal shower. After this patient is to  follow up in 2 weeks. In the meantime, patient to call office if any issues or problems arise.   Landis Martins, DPM

## 2016-08-25 ENCOUNTER — Ambulatory Visit: Payer: Medicaid Other

## 2016-08-25 DIAGNOSIS — Z9889 Other specified postprocedural states: Secondary | ICD-10-CM

## 2016-08-25 DIAGNOSIS — M2041 Other hammer toe(s) (acquired), right foot: Secondary | ICD-10-CM

## 2016-08-25 NOTE — Progress Notes (Signed)
Diane Garrison is a 31 y.o. female patient seen today in office for POV #2 (DOS 08-03-16), S/P Right 5th hammertoe repair with excision of corn at 4th webspace  She currently has no pedal complaints at this time  Noted well healing surgical sites. All sutures were removed, no gapping. Areas remained aligned and approximated. No drainage, erythema. Swelling minimal.     Advised patient on after care of surgical sites. She is to remain in surgical shoe for 1 more week, then she could try a regular shoe that has toe space, one that is comfortable and does not cause pain. Advised her that she may have to transition slowly to a regular shoe. She is to report any acute symptom changes and follow up with Dr Cannon Kettle in 2 weeks.

## 2016-09-04 ENCOUNTER — Telehealth: Payer: Self-pay

## 2016-09-04 NOTE — Telephone Encounter (Signed)
-----   Message from Chancy Milroy, MD sent at 08/17/2016  7:59 AM EDT ----- Pt will need to make appt  To discuss options Thanks Legrand Como ----- Message ----- From: Tamela Oddi, RMA Sent: 08/06/2016   8:40 AM To: Chancy Milroy, MD  Patient was notified of her results, when given her options, she requested that Dr. Rip Harbour calls her

## 2016-09-04 NOTE — Telephone Encounter (Signed)
Patient was notified of need to make an appointment to discuss options.  She said she will do so after she recover from her foot surgery.

## 2016-09-08 ENCOUNTER — Telehealth: Payer: Self-pay | Admitting: *Deleted

## 2016-09-08 NOTE — Telephone Encounter (Addendum)
Pt states she needs a note to return to work. Unable to leave a message to inform pt Dr. Cannon Kettle will be in office 09/15/2017 and I will discuss with her.09/10/2016-Unable to leave a message to inform pt Dr.Stover had okayed for her to return to work without restrictions, due to voicemail not set up. Letter faxed to Attn:  Mrs. Toy Cookey stating pt can return to full duty on 09/16/2016 without restrictions.

## 2016-09-10 ENCOUNTER — Encounter: Payer: Self-pay | Admitting: *Deleted

## 2016-09-10 NOTE — Telephone Encounter (Signed)
Pt called back asking if we could fax the letter to [phone] attention Mrs Toy Cookey. She is wanting to start back next Wednesday.

## 2016-09-10 NOTE — Telephone Encounter (Signed)
Yes we can give her a note to return to work with no restrictions  Thanks Dr. Cannon Kettle

## 2016-10-13 ENCOUNTER — Encounter: Payer: Self-pay | Admitting: Sports Medicine

## 2016-10-13 ENCOUNTER — Ambulatory Visit (INDEPENDENT_AMBULATORY_CARE_PROVIDER_SITE_OTHER): Payer: Managed Care, Other (non HMO)

## 2016-10-13 ENCOUNTER — Ambulatory Visit (INDEPENDENT_AMBULATORY_CARE_PROVIDER_SITE_OTHER): Payer: Managed Care, Other (non HMO) | Admitting: Sports Medicine

## 2016-10-13 DIAGNOSIS — S99921A Unspecified injury of right foot, initial encounter: Secondary | ICD-10-CM

## 2016-10-13 DIAGNOSIS — M79671 Pain in right foot: Secondary | ICD-10-CM | POA: Diagnosis not present

## 2016-10-13 DIAGNOSIS — S9031XA Contusion of right foot, initial encounter: Secondary | ICD-10-CM

## 2016-10-13 NOTE — Progress Notes (Signed)
Subjective: Diane Garrison is a 31 y.o. female patient who presents to office for evaluation of right foot pain. Patient complains of progressive pain especially over the last 2 days after a clothing rack at work, fell and hit her right foot. Patient reports that she tried resting and elevation, which helped a little bit. However, pain is most intense across the top of the foot. Patient denies any other pedal complaints. Denies any other causative factors.  Patient Active Problem List   Diagnosis Date Noted  . Excessive or frequent menstruation 02/13/2013    Current Outpatient Prescriptions on File Prior to Visit  Medication Sig Dispense Refill  . albuterol (PROVENTIL HFA;VENTOLIN HFA) 108 (90 BASE) MCG/ACT inhaler Inhale 2 puffs into the lungs every 6 (six) hours as needed. For shortness of breath.    . ARIPiprazole (ABILIFY) 20 MG tablet Take 20 mg by mouth daily.    . beclomethasone (QVAR) 40 MCG/ACT inhaler Inhale 1 puff into the lungs daily as needed (shortness of breath).     . cetirizine (ZYRTEC ALLERGY) 10 MG tablet Take 1 tablet (10 mg total) by mouth daily. (Patient not taking: Reported on 06/29/2016) 30 tablet 1  . docusate sodium (COLACE) 100 MG capsule Take 100 mg by mouth 2 (two) times daily as needed for mild constipation.    . fluticasone (FLONASE) 50 MCG/ACT nasal spray Place 2 sprays into both nostrils daily. (Patient not taking: Reported on 06/29/2016) 16 g 1  . ibuprofen (ADVIL,MOTRIN) 600 MG tablet Take 1 tablet (600 mg total) by mouth every 6 (six) hours as needed. (Patient not taking: Reported on 06/29/2016) 30 tablet 0  . loratadine-pseudoephedrine (CLARITIN-D 24-HOUR) 10-240 MG per 24 hr tablet Take 1 tablet by mouth daily as needed for allergies.     . megestrol (MEGACE) 40 MG tablet Take 1 tablet (40 mg total) by mouth 2 (two) times daily. Can increase to two tablets twice a day in the event of heavy bleeding 60 tablet 5  . oxyCODONE-acetaminophen  (PERCOCET/ROXICET) 5-325 MG tablet Take 1 tablet by mouth every 8 (eight) hours as needed for severe pain.    . promethazine (PHENERGAN) 25 MG tablet Take 25 mg by mouth every 8 (eight) hours as needed for nausea or vomiting.    . traZODone (DESYREL) 100 MG tablet Take 100 mg by mouth at bedtime.     No current facility-administered medications on file prior to visit.     Allergies  Allergen Reactions  . Coconut Flavor   . Ampicillin Rash  . Latex Rash    Objective:  General: Alert and oriented x3 in no acute distress  Dermatology: Surgical site at right fifth toe, well-healed. No open lesions bilateral lower extremities, no webspace macerations, faint ecchymosis dorsal right foot, all nails x 10 are well manicured.  Vascular: Dorsalis Pedis and Posterior Tibial pedal pulses palpable, Capillary Fill Time 3 seconds,(+) pedal hair growth bilateral, mild edema right dorsal foot, Temperature gradient within normal limits.  Neurology: Johney Maine sensation intact via light touch bilateral, (- )Tinels sign bilateral.   Musculoskeletal: Mild tenderness with palpation at right dorsal foot extending along the extensor tendon starting at the level of the midfoot to the ankle. Strength within normal limits in all groups bilateral. However, there is mild guarding on the right foot as it is painful for patient.  Gait: Antalgic gait  Xrays  Right Foot   Impression:s/p 5th PIPJ arthroplasy. No acute fracture. Mild soft tissue swelling.   Assessment and Plan:  Problem List Items Addressed This Visit    None    Visit Diagnoses    Right foot injury, initial encounter    -  Primary   Relevant Orders   DG Foot Complete Right (Completed)   Contusion of right foot, initial encounter       Right foot pain           -Complete examination performed -Xrays reviewed -Discussed treatement options for contusion injury right foot -Applied unna boot and advised patient to return to post op shoe 1 week -No  work for 1 week -Recommend PRICE protocol and over-the-counter anti-inflammatories as needed for pain -Patient to return to office in 2 weeks or sooner if condition worsens.  Landis Martins, DPM

## 2016-11-10 ENCOUNTER — Ambulatory Visit: Payer: Medicaid Other | Admitting: Sports Medicine

## 2016-12-01 ENCOUNTER — Ambulatory Visit: Payer: Managed Care, Other (non HMO) | Admitting: Sports Medicine

## 2017-09-22 DIAGNOSIS — H6980 Other specified disorders of Eustachian tube, unspecified ear: Secondary | ICD-10-CM | POA: Diagnosis not present

## 2017-09-22 DIAGNOSIS — J301 Allergic rhinitis due to pollen: Secondary | ICD-10-CM | POA: Diagnosis not present

## 2017-09-25 DIAGNOSIS — G43009 Migraine without aura, not intractable, without status migrainosus: Secondary | ICD-10-CM | POA: Diagnosis not present

## 2017-10-15 DIAGNOSIS — H6983 Other specified disorders of Eustachian tube, bilateral: Secondary | ICD-10-CM | POA: Diagnosis not present

## 2017-10-15 DIAGNOSIS — H938X3 Other specified disorders of ear, bilateral: Secondary | ICD-10-CM | POA: Insufficient documentation

## 2017-10-15 DIAGNOSIS — H903 Sensorineural hearing loss, bilateral: Secondary | ICD-10-CM | POA: Diagnosis not present

## 2017-10-15 DIAGNOSIS — H93293 Other abnormal auditory perceptions, bilateral: Secondary | ICD-10-CM | POA: Insufficient documentation

## 2017-10-15 DIAGNOSIS — M26623 Arthralgia of bilateral temporomandibular joint: Secondary | ICD-10-CM | POA: Insufficient documentation

## 2018-02-18 ENCOUNTER — Emergency Department (HOSPITAL_COMMUNITY)
Admission: EM | Admit: 2018-02-18 | Discharge: 2018-02-18 | Disposition: A | Discharge status: Home / Self Care | Payer: Medicaid Other | Attending: Emergency Medicine | Admitting: Emergency Medicine

## 2018-02-18 ENCOUNTER — Emergency Department (HOSPITAL_COMMUNITY): Payer: Medicaid Other

## 2018-02-18 ENCOUNTER — Other Ambulatory Visit: Payer: Self-pay

## 2018-02-18 ENCOUNTER — Encounter (HOSPITAL_COMMUNITY): Payer: Self-pay | Admitting: Emergency Medicine

## 2018-02-18 DIAGNOSIS — D649 Anemia, unspecified: Secondary | ICD-10-CM | POA: Insufficient documentation

## 2018-02-18 DIAGNOSIS — F1290 Cannabis use, unspecified, uncomplicated: Secondary | ICD-10-CM | POA: Insufficient documentation

## 2018-02-18 DIAGNOSIS — R1011 Right upper quadrant pain: Secondary | ICD-10-CM | POA: Diagnosis not present

## 2018-02-18 DIAGNOSIS — F319 Bipolar disorder, unspecified: Secondary | ICD-10-CM | POA: Diagnosis not present

## 2018-02-18 DIAGNOSIS — Z79899 Other long term (current) drug therapy: Secondary | ICD-10-CM | POA: Insufficient documentation

## 2018-02-18 DIAGNOSIS — R10816 Epigastric abdominal tenderness: Secondary | ICD-10-CM | POA: Diagnosis not present

## 2018-02-18 DIAGNOSIS — F17210 Nicotine dependence, cigarettes, uncomplicated: Secondary | ICD-10-CM | POA: Insufficient documentation

## 2018-02-18 DIAGNOSIS — Z91040 Latex allergy status: Secondary | ICD-10-CM | POA: Diagnosis not present

## 2018-02-18 LAB — CBC
HCT: 46.6 % — ABNORMAL HIGH (ref 36.0–46.0)
Hemoglobin: 15.1 g/dL — ABNORMAL HIGH (ref 12.0–15.0)
MCH: 31.7 pg (ref 26.0–34.0)
MCHC: 32.4 g/dL (ref 30.0–36.0)
MCV: 97.9 fL (ref 80.0–100.0)
Platelets: 285 10*3/uL (ref 150–400)
RBC: 4.76 MIL/uL (ref 3.87–5.11)
RDW: 12.5 % (ref 11.5–15.5)
WBC: 6.9 10*3/uL (ref 4.0–10.5)
nRBC: 0 % (ref 0.0–0.2)

## 2018-02-18 LAB — COMPREHENSIVE METABOLIC PANEL
ALK PHOS: 63 U/L (ref 38–126)
ALT: 27 U/L (ref 0–44)
AST: 20 U/L (ref 15–41)
Albumin: 4 g/dL (ref 3.5–5.0)
Anion gap: 8 (ref 5–15)
BUN: 7 mg/dL (ref 6–20)
CALCIUM: 9.3 mg/dL (ref 8.9–10.3)
CO2: 24 mmol/L (ref 22–32)
Chloride: 109 mmol/L (ref 98–111)
Creatinine, Ser: 0.95 mg/dL (ref 0.44–1.00)
Glucose, Bld: 83 mg/dL (ref 70–99)
Potassium: 4.3 mmol/L (ref 3.5–5.1)
Sodium: 141 mmol/L (ref 135–145)
Total Bilirubin: 0.6 mg/dL (ref 0.3–1.2)
Total Protein: 7.1 g/dL (ref 6.5–8.1)

## 2018-02-18 LAB — LIPASE, BLOOD: Lipase: 27 U/L (ref 11–51)

## 2018-02-18 LAB — I-STAT BETA HCG BLOOD, ED (MC, WL, AP ONLY): I-stat hCG, quantitative: <5 m[IU]/mL (ref <5)

## 2018-02-18 MED ORDER — OXYCODONE-ACETAMINOPHEN 5-325 MG PO TABS
1.0000 | ORAL_TABLET | Freq: Once | ORAL | Status: AC
  Administered 2018-02-18: 1 via ORAL
  Filled 2018-02-18: qty 1

## 2018-02-18 NOTE — ED Notes (Signed)
Patient verbalizes understanding of discharge instructions. Opportunity for questioning and answers were provided. Armband removed by staff, pt discharged from ED.  

## 2018-02-18 NOTE — ED Triage Notes (Signed)
Pt presents with intermittent RUQ abd pain that has been going on for the last couple of days. Pt reports it feels like there is a knot in her abd. Denies N/V/D.

## 2018-02-18 NOTE — ED Provider Notes (Signed)
[facility] EMERGENCY DEPARTMENT Provider Note   CSN: [id number] Arrival date & time: 02/18/18  1512     History   Chief Complaint Chief Complaint  Patient presents with  . Abdominal Pain    HPI Diane Garrison is a 32 y.o. female with past medical history of endometriosis, anxiety, asthma, bipolar 2 disorder, anemia, presenting to the emergency department with complaint of a few days of gradual onset of intermittent right upper quadrant abdominal pain.  Symptoms come and go, described as cramping similar to pain she felt with endometriosis.  Also relates it to labor pains.  Reports associated subjective fevers and chills.  No medications tried for symptoms.  She denies any associated nausea, vomiting, diarrhea, Pepsi a, vaginal bleeding or discharge, urinary symptoms.  Past abdominal surgeries include cholecystectomy and tubal ligation.  The history is provided by the patient.    Past Medical History:  Diagnosis Date  . Allergy   . Anemia   . Anxiety   . Asthma   . Bipolar disorder ([place])    Bipolar 2  . Cigarette smoker   . Depression   . Insomnia   . Migraine     Patient Active Problem List   Diagnosis Date Noted  . Excessive or frequent menstruation 02/13/2013    Past Surgical History:  Procedure Laterality Date  . CESAREAN SECTION  2004,2006,2007,2008   x 4  . CHOLECYSTECTOMY    . DILITATION & CURRETTAGE/HYSTROSCOPY WITH HYDROTHERMAL ABLATION N/A 03/16/2014   Procedure: DILATATION & CURETTAGE/HYSTEROSCOPY WITH HYDROTHERMAL ABLATION;  Surgeon: Shelly Bombard, MD;  Location: [facility];  Service: Gynecology;  Laterality: N/A;  . TUBAL LIGATION    . WISDOM TOOTH EXTRACTION       OB History   No obstetric history on file.      Home Medications    Prior to Admission medications   Medication Sig Start Date End Date Taking? Authorizing Provider  albuterol (PROVENTIL HFA;VENTOLIN HFA) 108 (90 BASE) MCG/ACT inhaler Inhale 2 puffs into  the lungs every 6 (six) hours as needed. For shortness of breath.    [provider]  ARIPiprazole (ABILIFY) 20 MG tablet Take 20 mg by mouth daily.    [provider]  beclomethasone (QVAR) 40 MCG/ACT inhaler Inhale 1 puff into the lungs daily as needed (shortness of breath).     [provider]  cetirizine (ZYRTEC ALLERGY) 10 MG tablet Take 1 tablet (10 mg total) by mouth daily. Patient not taking: Reported on 06/29/2016 09/26/14   Waynetta Pean, PA-C  docusate sodium (COLACE) 100 MG capsule Take 100 mg by mouth 2 (two) times daily as needed for mild constipation. 08/03/16   Stover, Titorya, DPM  fluticasone (FLONASE) 50 MCG/ACT nasal spray Place 2 sprays into both nostrils daily. Patient not taking: Reported on 06/29/2016 09/26/14   Waynetta Pean, PA-C  ibuprofen (ADVIL,MOTRIN) 600 MG tablet Take 1 tablet (600 mg total) by mouth every 6 (six) hours as needed. Patient not taking: Reported on 06/29/2016 02/18/15   Marella Chimes, PA-C  loratadine-pseudoephedrine (CLARITIN-D 24-HOUR) 10-240 MG per 24 hr tablet Take 1 tablet by mouth daily as needed for allergies.     [provider]  megestrol (MEGACE) 40 MG tablet Take 1 tablet (40 mg total) by mouth 2 (two) times daily. Can increase to two tablets twice a day in the event of heavy bleeding 07/28/16   Chancy Milroy, MD  oxyCODONE-acetaminophen (PERCOCET/ROXICET) 5-325 MG tablet Take 1 tablet by  mouth every 8 (eight) hours as needed for severe pain. 08/03/16   Landis Martins, DPM  promethazine (PHENERGAN) 25 MG tablet Take 25 mg by mouth every 8 (eight) hours as needed for nausea or vomiting. 08/03/16   Landis Martins, DPM  traZODone (DESYREL) 100 MG tablet Take 100 mg by mouth at bedtime.    [provider]    Family History Family History  Problem Relation Age of Onset  . Heart disease Mother   . Heart disease Maternal Grandmother     Social History Social History   Tobacco Use  . Smoking  status: Current Some Day Smoker    Packs/day: 0.50    Years: 3.00    Pack years: 1.50    Types: Cigarettes  . Smokeless tobacco: Never Used  Substance Use Topics  . Alcohol use: No    Alcohol/week: 0.0 standard drinks  . Drug use: Yes    Types: Marijuana     Allergies   Coconut flavor; Ampicillin; and Latex   Review of Systems Review of Systems  Constitutional: Positive for fever (Subjective).  Gastrointestinal: Positive for abdominal pain. Negative for constipation, diarrhea, nausea and vomiting.  Genitourinary: Negative for dysuria, frequency, hematuria, vaginal bleeding and vaginal discharge.  All other systems reviewed and are negative.    Physical Exam Updated Vital Signs BP 126/69 (BP Location: Right Arm)   Pulse 99   Temp 98.4 F (36.9 C) (Oral)   Resp (!) 22   Ht 5\' 1"  (1.549 m)   Wt 59.4 kg   LMP 02/16/2018   SpO2 98%   BMI 24.75 kg/m   Physical Exam Vitals signs and nursing note reviewed.  Constitutional:      Appearance: She is well-developed. She is not ill-appearing.     Comments: Patient intermittently pacing the room.  HENT:     Head: Normocephalic and atraumatic.  Eyes:     Conjunctiva/sclera: Conjunctivae normal.  Cardiovascular:     Rate and Rhythm: Normal rate and regular rhythm.  Pulmonary:     Effort: Pulmonary effort is normal.  Abdominal:     General: Abdomen is flat. Bowel sounds are normal.     Palpations: Abdomen is soft.     Tenderness: There is abdominal tenderness in the right upper quadrant and epigastric area. There is no guarding or rebound.  Skin:    General: Skin is warm.  Neurological:     Mental Status: She is alert.  Psychiatric:        Behavior: Behavior normal.      ED Treatments / Results  Labs (all labs ordered are listed, but only abnormal results are displayed) Labs Reviewed  CBC - Abnormal; Notable for the following components:      Result Value   Hemoglobin 15.1 (*)    HCT 46.6 (*)    All other  components within normal limits  LIPASE, BLOOD  COMPREHENSIVE METABOLIC PANEL  URINALYSIS, ROUTINE W REFLEX MICROSCOPIC  I-STAT BETA HCG BLOOD, ED ([facility], [facility], [facility] ONLY)    EKG None  Radiology US Abdomen Complete  Result Date: 02/18/2018 CLINICAL DATA:  Right upper quadrant abdominal pain for 3 days. Prior cholecystectomy. EXAM: ABDOMEN ULTRASOUND COMPLETE COMPARISON:  None. FINDINGS: Gallbladder: Surgically absent Common bile duct: Diameter: 5 mm Liver: No focal lesion identified. Within normal limits in parenchymal echogenicity. Portal vein is patent on color Doppler imaging with normal direction of blood flow towards the liver. IVC: No abnormality visualized. Pancreas: Visualized portion unremarkable. Spleen: Size and appearance  within normal limits. Right Kidney: Length: 11.8 cm. Echogenicity within normal limits. No mass or hydronephrosis visualized. Left Kidney: Length: 11.0 cm. Echogenicity within normal limits. No mass or hydronephrosis visualized. Abdominal aorta: No aneurysm visualized. Other findings: None. IMPRESSION: 1. No appreciable abnormality. 2. The gallbladder surgically absent. Electronically Signed   By: Van Clines M.D.   On: 02/18/2018 18:44    Procedures Procedures (including critical care time)  Medications Ordered in ED Medications  oxyCODONE-acetaminophen (PERCOCET/ROXICET) 5-325 MG per tablet 1 tablet (1 tablet Oral Given 02/18/18 1724)     Initial Impression / Assessment and Plan / ED Course  I have reviewed the triage vital signs and the nursing notes.  Pertinent labs & imaging results that were available during my care of the patient were reviewed by me and considered in my medical decision making (see chart for details).  Clinical Course as of Feb 19 2007  Fri Feb 18, 2018  1904 Reevaluated.  Reports improvement in symptoms.  Abdominal ultrasound is normal.  Patient is agreeable to discharge with symptomatic management.  Strict return precautions  discussed.   [JR]    Clinical Course User Index [JR] Robinson, [place] N, PA-C    Pt w RUQ abdominal pain. Hx of cholecystectomy. Patient is nontoxic, nonseptic appearing, in no apparent distress.  Patient's pain and other symptoms adequately managed in emergency department.  Labs, imaging and vitals reviewed.  No leukocytosis. Normal kidney and liver function. No urinary sx. Pt discussed with Dr. Vanita Panda, recommending abdominal ultrasound. U/S resulting as negative. Patient does not meet the SIRS or Sepsis criteria.  On repeat exam patient does not have a surgical abdomen and there are no peritoneal signs.  No indication of appendicitis, bowel obstruction, bowel perforation, cholecystitis, diverticulitis, PID or ectopic pregnancy.  Patient discharged home with symptomatic treatment and given strict instructions for follow-up with their primary care physician.  Pt safe for discharge.  Discussed results, findings, treatment and follow up. Patient advised of return precautions. Patient verbalized understanding and agreed with plan.  Final Clinical Impressions(s) / ED Diagnoses   Final diagnoses:  RUQ abdominal pain    ED Discharge Orders    None       Robinson, [place] N, PA-C 02/18/18 2008    Carmin Muskrat, MD 02/20/18 [phone]

## 2018-02-18 NOTE — ED Notes (Signed)
Pt returned from imaging.

## 2018-02-18 NOTE — Discharge Instructions (Addendum)
Take tylenol every 4 hours as needed for pain. Stay hydrated. Follow up with your primary care provider on Monday if your symptoms persist. Return to the emergency department if you experience severely worsening abdominal pain, fever, uncontrollable vomiting.

## 2018-04-05 ENCOUNTER — Ambulatory Visit: Payer: Managed Care, Other (non HMO) | Admitting: Sports Medicine

## 2018-04-13 ENCOUNTER — Encounter: Payer: Self-pay | Admitting: Obstetrics and Gynecology

## 2018-04-13 ENCOUNTER — Ambulatory Visit: Payer: Medicaid Other | Admitting: Obstetrics and Gynecology

## 2018-04-13 DIAGNOSIS — N938 Other specified abnormal uterine and vaginal bleeding: Secondary | ICD-10-CM | POA: Diagnosis not present

## 2018-04-13 NOTE — Progress Notes (Signed)
Pt is here for AUB. Hx of Ablation in 2016. Hx of BTL. Pt states that her periods have come back (started about a year ago) and she is now having heavy menses twice per month. Pt denies any pain associated with the bleeding.

## 2018-04-13 NOTE — Patient Instructions (Signed)
Vaginal Hysterectomy  A vaginal hysterectomy is a procedure to remove all or part of the uterus through a small incision in the vagina. In this procedure, your health care provider may remove your entire uterus, including the lower end (cervix). You may need a vaginal hysterectomy to treat:  Uterine fibroids.  A condition that causes the lining of the uterus to grow in other areas (endometriosis).  Problems with pelvic support.  Cancer of the cervix, ovaries, uterus, or tissue that lines the uterus (endometrium).  Excessive (dysfunctional) uterine bleeding. When removing your uterus, your health care provider may also remove the organs that produce eggs (ovaries) and the tubes that carry eggs to your uterus (fallopian tubes). After a vaginal hysterectomy, you will no longer be able to have a baby. You will also no longer get your menstrual period. Tell a health care provider about:  Any allergies you have.  All medicines you are taking, including vitamins, herbs, eye drops, creams, and over-the-counter medicines.  Any problems you or family members have had with anesthetic medicines.  Any blood disorders you have.  Any surgeries you have had.  Any medical conditions you have.  Whether you are pregnant or may be pregnant. What are the risks? Generally, this is a safe procedure. However, problems may occur, including:  Bleeding.  Infection.  A blood clot that forms in your leg and travels to your lungs (pulmonary embolism).  Damage to surrounding organs.  Pain during sex. What happens before the procedure?  Ask your health care provider what organs will be removed during surgery.  Ask your health care provider about: ? Changing or stopping your regular medicines. This is especially important if you are taking diabetes medicines or blood thinners. ? Taking medicines such as aspirin and ibuprofen. These medicines can thin your blood. Do not take these medicines before your  procedure if your health care provider instructs you not to.  Follow instructions from your health care provider about eating or drinking restrictions.  Do not use any tobacco products, such as cigarettes, chewing tobacco, and e-cigarettes. If you need help quitting, ask your health care provider.  Plan to have someone take you home after discharge from the hospital. What happens during the procedure?  To reduce your risk of infection: ? Your health care team will wash or sanitize their hands. ? Your skin will be washed with soap.  An IV tube will be inserted into one of your veins.  You may be given antibiotic medicine to help prevent infection.  You will be given one or more of the following: ? A medicine to help you relax (sedative). ? A medicine to numb the area (local anesthetic). ? A medicine to make you fall asleep (general anesthetic). ? A medicine that is injected into an area of your body to numb everything beyond the injection site (regional anesthetic).  Your surgeon will make an incision in your vagina.  Your surgeon will locate and remove all or part of your uterus.  Your ovaries and fallopian tubes may be removed at the same time.  The incision will be closed with stitches (sutures) that dissolve over time. The procedure may vary among health care providers and hospitals. What happens after the procedure?  Your blood pressure, heart rate, breathing rate, and blood oxygen level will be monitored often until the medicines you were given have worn off.  You will be encouraged to get up and walk around after a few hours to help prevent complications.  You may have IV tubes in place for a few days.  You will be given pain medicine as needed.  Do not drive for 24 hours if you were given a sedative. This information is not intended to replace advice given to you by your health care provider. Make sure you discuss any questions you have with your health care provider.  Document Released: 06/10/2015 Document Revised: 07/25/2015 Document Reviewed: 03/03/2015 Elsevier Interactive Patient Education  2019 [employer]  

## 2018-04-13 NOTE — Progress Notes (Signed)
Diane Garrison is here for f/u of her DUB. Pt continues to have DUB. See prior notes. She desires definite therapy. Has recovered from her foot surgery.  U/S small 1 cm fibroid H/O endometrial ablation EMBX normal  Has failed medical management options fo rher DUB  H/O C section x 4.  PE AF VSS Lungs clear Heart RRR Abd soft + BS GU Nl EGBUS uterus < 10 size mobile non tender no masses  A/P DUB refractory to medical treatment        S/P Endometrial ablation  Desires definite therapy. TVH/BS reviewed with pt. R/B/Post op care discussed. Pt verbalized understanding and desires to proceed. Will schedule. F/U with post op appt.

## 2018-04-14 ENCOUNTER — Encounter (HOSPITAL_COMMUNITY): Payer: Self-pay

## 2018-04-18 ENCOUNTER — Encounter: Payer: Self-pay | Admitting: *Deleted

## 2018-04-19 ENCOUNTER — Encounter: Payer: Self-pay | Admitting: Sports Medicine

## 2018-04-19 ENCOUNTER — Ambulatory Visit (INDEPENDENT_AMBULATORY_CARE_PROVIDER_SITE_OTHER): Payer: Medicaid Other

## 2018-04-19 ENCOUNTER — Ambulatory Visit: Payer: Medicaid Other | Admitting: Sports Medicine

## 2018-04-19 ENCOUNTER — Other Ambulatory Visit: Payer: Self-pay | Admitting: Sports Medicine

## 2018-04-19 DIAGNOSIS — L84 Corns and callosities: Secondary | ICD-10-CM

## 2018-04-19 DIAGNOSIS — M2042 Other hammer toe(s) (acquired), left foot: Secondary | ICD-10-CM

## 2018-04-19 DIAGNOSIS — M79675 Pain in left toe(s): Secondary | ICD-10-CM

## 2018-04-19 NOTE — Patient Instructions (Signed)
Pre-Operative Instructions  Congratulations, you have decided to take an important step towards improving your quality of life.  You can be assured that the doctors and staff at [facility] will be with you every step of the way.  Here are some important things you should know:  1. Plan to be at the surgery center/hospital at least 1 (one) hour prior to your scheduled time, unless otherwise directed by the surgical center/hospital staff.  You must have a responsible adult accompany you, remain during the surgery and drive you home.  Make sure you have directions to the surgical center/hospital to ensure you arrive on time. 2. If you are having surgery at [facility] or [facility] hospitals, you will need a copy of your medical history and physical form from your family physician within one month prior to the date of surgery. We will give you a form for your primary physician to complete.  3. We make every effort to accommodate the date you request for surgery.  However, there are times where surgery dates or times have to be moved.  We will contact you as soon as possible if a change in schedule is required.   4. No aspirin/ibuprofen for one week before surgery.  If you are on aspirin, any non-steroidal anti-inflammatory medications (Mobic, Aleve, Ibuprofen) should not be taken seven (7) days prior to your surgery.  You make take Tylenol for pain prior to surgery.  5. Medications - If you are taking daily heart and blood pressure medications, seizure, reflux, allergy, asthma, anxiety, pain or diabetes medications, make sure you notify the surgery center/hospital before the day of surgery so they can tell you which medications you should take or avoid the day of surgery. 6. No food or drink after midnight the night before surgery unless directed otherwise by surgical center/hospital staff. 7. No alcoholic beverages 24-hours prior to surgery.  No smoking 24-hours prior or 24-hours after  surgery. 8. Wear loose pants or shorts. They should be loose enough to fit over bandages, boots, and casts. 9. Don't wear slip-on shoes. Sneakers are preferred. 10. Bring your boot with you to the surgery center/hospital.  Also bring crutches or a walker if your physician has prescribed it for you.  If you do not have this equipment, it will be provided for you after surgery. 11. If you have not been contacted by the surgery center/hospital by the day before your surgery, call to confirm the date and time of your surgery. 12. Leave-time from work may vary depending on the type of surgery you have.  Appropriate arrangements should be made prior to surgery with your employer. 13. Prescriptions will be provided immediately following surgery by your doctor.  Fill these as soon as possible after surgery and take the medication as directed. Pain medications will not be refilled on weekends and must be approved by the doctor. 14. Remove nail polish on the operative foot and avoid getting pedicures prior to surgery. 15. Wash the night before surgery.  The night before surgery wash the foot and leg well with water and the antibacterial soap provided. Be sure to pay special attention to beneath the toenails and in between the toes.  Wash for at least three (3) minutes. Rinse thoroughly with water and dry well with a towel.  Perform this wash unless told not to do so by your physician.  Enclosed: 1 Ice pack (please put in freezer the night before surgery)   1 Hibiclens skin cleaner     Pre-op instructions  If you have any questions regarding the instructions, please do not hesitate to call our office.  [place]: [address], [place], [place] [address] -- [phone]  [place]: [address] [place], [place] [address] -- [phone]  [place]: [address]  [place], [place] [address] -- [phone]  [place]: [address], [address], [place], [place] [address] -- [phone]  Website:  [email or web address] 

## 2018-04-19 NOTE — Progress Notes (Signed)
Subjective: Kalimah Capurro is a 33 y.o. female patient presents to office with complaint of pain at left 5th toe with callus in between. Reports that she tried cushion that helped and needs another. Denies any other pedal complaints.   Patient Active Problem List   Diagnosis Date Noted  . DUB (dysfunctional uterine bleeding) 04/13/2018  . Abnormal auditory perception of both ears 10/15/2017  . Bilateral temporomandibular joint pain 10/15/2017  . Ear pressure, bilateral 10/15/2017  . Moderate persistent asthma without complication 51/04/5850  . Tobacco use 08/02/2014  . Excessive or frequent menstruation 02/13/2013  . Allergic rhinitis 07/13/2012  . Headache, menstrual migraine 07/13/2012  . Vitamin D deficiency 01/05/2010  . Hypertrophy of breast 02/04/2009  . Mastodynia 02/04/2009  . Contact dermatitis and other eczema, due to unspecified cause 07/19/2008  . Herpes simplex virus (HSV) infection 07/19/2008    Current Outpatient Medications on File Prior to Visit  Medication Sig Dispense Refill  . albuterol (PROVENTIL HFA;VENTOLIN HFA) 108 (90 BASE) MCG/ACT inhaler Inhale 2 puffs into the lungs every 6 (six) hours as needed. For shortness of breath.    . ARIPiprazole (ABILIFY) 20 MG tablet Take 20 mg by mouth daily.    . beclomethasone (QVAR) 40 MCG/ACT inhaler Inhale 1 puff into the lungs daily as needed (shortness of breath).     . cetirizine (ZYRTEC ALLERGY) 10 MG tablet Take 1 tablet (10 mg total) by mouth daily. 30 tablet 1  . docusate sodium (COLACE) 100 MG capsule Take 100 mg by mouth 2 (two) times daily as needed for mild constipation.    . fluticasone (FLONASE) 50 MCG/ACT nasal spray Place 2 sprays into both nostrils daily. 16 g 1  . ibuprofen (ADVIL,MOTRIN) 600 MG tablet Take 1 tablet (600 mg total) by mouth every 6 (six) hours as needed. 30 tablet 0  . loratadine-pseudoephedrine (CLARITIN-D 24-HOUR) 10-240 MG per 24 hr tablet Take 1 tablet by mouth daily as needed for  allergies.     . megestrol (MEGACE) 40 MG tablet Take 1 tablet (40 mg total) by mouth 2 (two) times daily. Can increase to two tablets twice a day in the event of heavy bleeding 60 tablet 5  . oxyCODONE-acetaminophen (PERCOCET/ROXICET) 5-325 MG tablet Take 1 tablet by mouth every 8 (eight) hours as needed for severe pain.    . promethazine (PHENERGAN) 25 MG tablet Take 25 mg by mouth every 8 (eight) hours as needed for nausea or vomiting.    . SUMAtriptan (IMITREX) 100 MG tablet Take one at onset of severe migraine; may repeat in 2 hours. Limit 2/24 hrs    . traZODone (DESYREL) 100 MG tablet Take 100 mg by mouth at bedtime.    . triamcinolone cream (KENALOG) 0.5 % Apply topically.     No current facility-administered medications on file prior to visit.     Allergies  Allergen Reactions  . Coconut Oil Swelling  . Other Swelling    Tongue swelled Tongue swelled  . Coconut Flavor   . Ampicillin Rash  . Latex Rash   Social History   Socioeconomic History  . Marital status: Married    Spouse name: Not on file  . Number of children: Not on file  . Years of education: Not on file  . Highest education level: Not on file  Occupational History  . Not on file  Social Needs  . Financial resource strain: Not on file  . Food insecurity:    Worry: Not on file  Inability: Not on file  . Transportation needs:    Medical: Not on file    Non-medical: Not on file  Tobacco Use  . Smoking status: Current Some Day Smoker    Packs/day: 0.50    Years: 3.00    Pack years: 1.50    Types: Cigarettes  . Smokeless tobacco: Never Used  Substance and Sexual Activity  . Alcohol use: No    Alcohol/week: 0.0 standard drinks  . Drug use: Yes    Types: Marijuana  . Sexual activity: Yes    Partners: Female    Birth control/protection: Surgical  Lifestyle  . Physical activity:    Days per week: Not on file    Minutes per session: Not on file  . Stress: Not on file  Relationships  . Social  connections:    Talks on phone: Not on file    Gets together: Not on file    Attends religious service: Not on file    Active member of club or organization: Not on file    Attends meetings of clubs or organizations: Not on file    Relationship status: Not on file  Other Topics Concern  . Not on file  Social History Narrative  . Not on file    Past Surgical History:  Procedure Laterality Date  . CESAREAN SECTION  2004,2006,2007,2008   x 4  . CHOLECYSTECTOMY    . DILITATION & CURRETTAGE/HYSTROSCOPY WITH HYDROTHERMAL ABLATION N/A 03/16/2014   Procedure: DILATATION & CURETTAGE/HYSTEROSCOPY WITH HYDROTHERMAL ABLATION;  Surgeon: Shelly Bombard, MD;  Location: [facility];  Service: Gynecology;  Laterality: N/A;  . FOOT SURGERY  2018  . TUBAL LIGATION  03/20/2007  . WISDOM TOOTH EXTRACTION       Objective: Physical Exam General: The patient is alert and oriented x3 in no acute distress.  Dermatology: Skin is warm, dry and supple bilateral lower extremities. Nails 1-10 are normal. There is no erythema, edema, no eccymosis, no open lesions present. There is minimal callusing in the fourth webspace on the left foot. Integument is otherwise unremarkable.  Vascular: Dorsalis Pedis pulse and Posterior Tibial pulse are 2/4 bilateral. Capillary fill time is immediate to all digits.  Neurological: Grossly intact to light touch with an achilles reflex of +2/5 and a  negative Tinel's sign bilateral.  Musculoskeletal: There is tenderness with palpation to the left fifth hammertoe and to the callus lesion in between the toes. Tenderness to palpation at the plantar arch through the insertion of the plantar fascia on the left foot. No pain with compression of calcaneus bilateral. No pain with tuning fork to calcaneus bilateral. No pain with calf compression bilateral. There is decreased Ankle joint range of motion bilateral. All other joints range of motion within normal limits bilateral. Strength 5/5 in  all groups bilateral. Pes planus foot type bilateral.   Xray, Left foot:  Normal osseous mineralization. Joint spaces preserved. No fracture/dislocation/boney destruction. Calcaneal spur present with mild thickening of plantar fascia on the left. There is evidence of increased space, likely arthroplasty on the left fifth toe, however patient denies surgery to this area. There is mild hammertoe bilateral. There is pes planus deformity, bilateral. No other soft tissue abnormalities or radiopaque foreign bodies.   Assessment and Plan: Problem List Items Addressed This Visit    None    Visit Diagnoses    Pain of toe of left foot    -  Primary   Hammertoe of left foot  Corn of toe       Toe pain, left         -Complete examination performed.  -Xrays reviewed -Discussed with patient in detail of hammertoe and callus on left -Patient opt for surgical management of left fifth hammertoe and callus in between toes. Consent obtained for right fifth hammertoe repair and excision of benign lesion interdigitally. Pre and Post op course explained. Risks, benefits, alternatives explained. No guarantees given or implied. Surgical booking slip submitted and provided patient with Surgical packet and info for [place].  -To dispense postop shoe at surgical center -Patient to return to office after surgery for follow up or sooner if problems or questions arise.  Landis Martins, DPM

## 2018-05-02 ENCOUNTER — Other Ambulatory Visit: Payer: Self-pay | Admitting: Sports Medicine

## 2018-05-02 ENCOUNTER — Encounter: Payer: Self-pay | Admitting: Sports Medicine

## 2018-05-02 DIAGNOSIS — J45909 Unspecified asthma, uncomplicated: Secondary | ICD-10-CM | POA: Diagnosis not present

## 2018-05-02 DIAGNOSIS — D492 Neoplasm of unspecified behavior of bone, soft tissue, and skin: Secondary | ICD-10-CM | POA: Diagnosis not present

## 2018-05-02 DIAGNOSIS — M2042 Other hammer toe(s) (acquired), left foot: Secondary | ICD-10-CM | POA: Diagnosis not present

## 2018-05-02 DIAGNOSIS — L84 Corns and callosities: Secondary | ICD-10-CM | POA: Diagnosis not present

## 2018-05-02 DIAGNOSIS — Z9889 Other specified postprocedural states: Secondary | ICD-10-CM

## 2018-05-02 MED ORDER — IBUPROFEN 800 MG PO TABS: 800.0000 mg | ORAL_TABLET | Freq: Three times a day (TID) | ORAL | 0 refills | Status: DC | PRN

## 2018-05-02 MED ORDER — OXYCODONE-ACETAMINOPHEN 10-325 MG PO TABS: 1.0000 | ORAL_TABLET | Freq: Three times a day (TID) | ORAL | 0 refills | Status: DC | PRN

## 2018-05-02 MED ORDER — PROMETHAZINE HCL 25 MG PO TABS: 25.0000 mg | ORAL_TABLET | Freq: Three times a day (TID) | ORAL | 0 refills | Status: DC | PRN

## 2018-05-02 MED ORDER — DOCUSATE SODIUM 100 MG PO CAPS: 100.0000 mg | ORAL_CAPSULE | Freq: Two times a day (BID) | ORAL | 0 refills | Status: DC

## 2018-05-02 NOTE — Progress Notes (Signed)
Post op meds entered  -Dr. Adiel Erney 

## 2018-05-03 ENCOUNTER — Telehealth: Payer: Self-pay | Admitting: Sports Medicine

## 2018-05-03 NOTE — Telephone Encounter (Signed)
Post op check phone call made to patient. Patient did not answer. Left voicemail with instructions for patient to call office if she has any ?s or concerns. -Dr. Cannon Kettle

## 2018-05-10 ENCOUNTER — Ambulatory Visit (INDEPENDENT_AMBULATORY_CARE_PROVIDER_SITE_OTHER): Payer: Medicaid Other

## 2018-05-10 ENCOUNTER — Ambulatory Visit (INDEPENDENT_AMBULATORY_CARE_PROVIDER_SITE_OTHER): Payer: Self-pay | Admitting: Sports Medicine

## 2018-05-10 ENCOUNTER — Encounter: Payer: Self-pay | Admitting: Sports Medicine

## 2018-05-10 VITALS — Temp 100.0°F

## 2018-05-10 DIAGNOSIS — Z9889 Other specified postprocedural states: Secondary | ICD-10-CM

## 2018-05-10 DIAGNOSIS — M2042 Other hammer toe(s) (acquired), left foot: Secondary | ICD-10-CM | POA: Diagnosis not present

## 2018-05-10 DIAGNOSIS — M79675 Pain in left toe(s): Secondary | ICD-10-CM

## 2018-05-10 DIAGNOSIS — L84 Corns and callosities: Secondary | ICD-10-CM

## 2018-05-10 NOTE — Progress Notes (Signed)
Subjective: Diane Garrison is a 33 y.o. female patient seen today in office for POV #1 (DOS 05-02-18), S/P Left 5th hammertoe repair with excision of corn at 4th webspace. Patient denies pain at surgical site, denies calf pain, denies headache, chest pain, shortness of breath, nausea, vomiting, fever, or chills. Patient states that she is doing well and feels itchiness to toe. No other issues noted.   Patient Active Problem List   Diagnosis Date Noted  . DUB (dysfunctional uterine bleeding) 04/13/2018  . Abnormal auditory perception of both ears 10/15/2017  . Bilateral temporomandibular joint pain 10/15/2017  . Ear pressure, bilateral 10/15/2017  . Moderate persistent asthma without complication 22/29/7989  . Tobacco use 08/02/2014  . Excessive or frequent menstruation 02/13/2013  . Allergic rhinitis 07/13/2012  . Headache, menstrual migraine 07/13/2012  . Vitamin D deficiency 01/05/2010  . Hypertrophy of breast 02/04/2009  . Mastodynia 02/04/2009  . Contact dermatitis and other eczema, due to unspecified cause 07/19/2008  . Herpes simplex virus (HSV) infection 07/19/2008    Current Outpatient Medications on File Prior to Visit  Medication Sig Dispense Refill  . albuterol (PROVENTIL HFA;VENTOLIN HFA) 108 (90 BASE) MCG/ACT inhaler Inhale 2 puffs into the lungs every 6 (six) hours as needed. For shortness of breath.    . ARIPiprazole (ABILIFY) 20 MG tablet Take 20 mg by mouth daily.    . beclomethasone (QVAR) 40 MCG/ACT inhaler Inhale 1 puff into the lungs daily as needed (shortness of breath).     . cetirizine (ZYRTEC ALLERGY) 10 MG tablet Take 1 tablet (10 mg total) by mouth daily. 30 tablet 1  . docusate sodium (COLACE) 100 MG capsule Take 1 capsule (100 mg total) by mouth 2 (two) times daily. 10 capsule 0  . fluticasone (FLONASE) 50 MCG/ACT nasal spray Place 2 sprays into both nostrils daily. 16 g 1  . ibuprofen (ADVIL,MOTRIN) 800 MG tablet Take 1 tablet (800 mg total) by mouth  every 8 (eight) hours as needed. 30 tablet 0  . loratadine-pseudoephedrine (CLARITIN-D 24-HOUR) 10-240 MG per 24 hr tablet Take 1 tablet by mouth daily as needed for allergies.     . megestrol (MEGACE) 40 MG tablet Take 1 tablet (40 mg total) by mouth 2 (two) times daily. Can increase to two tablets twice a day in the event of heavy bleeding 60 tablet 5  . oxyCODONE-acetaminophen (PERCOCET) 10-325 MG tablet Take 1 tablet by mouth every 8 (eight) hours as needed for pain. 20 tablet 0  . promethazine (PHENERGAN) 25 MG tablet Take 1 tablet (25 mg total) by mouth every 8 (eight) hours as needed for nausea or vomiting. 20 tablet 0  . SUMAtriptan (IMITREX) 100 MG tablet Take one at onset of severe migraine; may repeat in 2 hours. Limit 2/24 hrs    . traZODone (DESYREL) 100 MG tablet Take 100 mg by mouth at bedtime.    . triamcinolone cream (KENALOG) 0.5 % Apply topically.     No current facility-administered medications on file prior to visit.     Allergies  Allergen Reactions  . Coconut Oil Swelling  . Other Swelling    Tongue swelled Tongue swelled  . Coconut Flavor   . Ampicillin Rash  . Latex Rash    Objective: There were no vitals filed for this visit.  General: No acute distress, AAOx3  Left foot: Sutures intact with no gapping or dehiscence at surgical sites, mild swelling to 5th toe on Left foot, no erythema, no warmth, no drainage, no  signs of infection noted, Capillary fill time <3 seconds in all digits, gross sensation present via light touch to Left foot. No pain or crepitation with range of motion right foot.  No pain with calf compression.   Post Op Xray, left foot: 5th toe arthroplasty. Soft tissue swelling within normal limits for post op status.   Assessment and Plan:  Problem List Items Addressed This Visit    None    Visit Diagnoses    Hammertoe of left foot    -  Primary   Relevant Orders   DG Foot Complete Left (Completed)   Corn of toe       Pain of toe of left  foot       S/P foot surgery, left       Relevant Orders   DG Foot Complete Left (Completed)      -Patient seen and evaluated -Xrays reviewed  -Applied dry sterile dressing to surgical site left foot secured with ACE wrap and stockinet  -Advised patient to make sure to keep dressings clean, dry, and intact to left surgical site, removing the ACE as needed  -Advised patient to continue with post-op shoe on left -Advised patient to limit activity to necessity  -Advised patient to ice and elevate as necessary  -Continue with PRN meds -Will plan for suture removal at next office visit. In the meantime, patient to call office if any issues or problems arise.   Landis Martins, DPM

## 2018-05-17 ENCOUNTER — Other Ambulatory Visit: Payer: Self-pay

## 2018-05-17 ENCOUNTER — Encounter: Payer: Self-pay | Admitting: Sports Medicine

## 2018-05-17 ENCOUNTER — Ambulatory Visit (INDEPENDENT_AMBULATORY_CARE_PROVIDER_SITE_OTHER): Payer: Medicaid Other | Admitting: Sports Medicine

## 2018-05-17 DIAGNOSIS — Z9889 Other specified postprocedural states: Secondary | ICD-10-CM

## 2018-05-17 DIAGNOSIS — L84 Corns and callosities: Secondary | ICD-10-CM

## 2018-05-17 DIAGNOSIS — M79675 Pain in left toe(s): Secondary | ICD-10-CM

## 2018-05-17 DIAGNOSIS — M2042 Other hammer toe(s) (acquired), left foot: Secondary | ICD-10-CM

## 2018-05-17 NOTE — Progress Notes (Signed)
Subjective: Diane Garrison is a 33 y.o. female patient seen today in office for POV #2 (DOS 05-02-18), S/P Left 5th hammertoe repair with excision of corn at 4th webspace. Patient denies pain at surgical site but reports that she had issue with constipation and took stool softner and had bellow pain so did take pain pill this morning, denies calf pain, denies headache, chest pain, shortness of breath, nausea, vomiting, fever, or chills. No other issues noted.   Patient Active Problem List   Diagnosis Date Noted  . DUB (dysfunctional uterine bleeding) 04/13/2018  . Abnormal auditory perception of both ears 10/15/2017  . Bilateral temporomandibular joint pain 10/15/2017  . Ear pressure, bilateral 10/15/2017  . Moderate persistent asthma without complication 11/91/4782  . Tobacco use 08/02/2014  . Excessive or frequent menstruation 02/13/2013  . Allergic rhinitis 07/13/2012  . Headache, menstrual migraine 07/13/2012  . Vitamin D deficiency 01/05/2010  . Hypertrophy of breast 02/04/2009  . Mastodynia 02/04/2009  . Contact dermatitis and other eczema, due to unspecified cause 07/19/2008  . Herpes simplex virus (HSV) infection 07/19/2008    Current Outpatient Medications on File Prior to Visit  Medication Sig Dispense Refill  . albuterol (PROVENTIL HFA;VENTOLIN HFA) 108 (90 BASE) MCG/ACT inhaler Inhale 2 puffs into the lungs every 6 (six) hours as needed. For shortness of breath.    . ARIPiprazole (ABILIFY) 20 MG tablet Take 20 mg by mouth daily.    . beclomethasone (QVAR) 40 MCG/ACT inhaler Inhale 1 puff into the lungs daily as needed (shortness of breath).     . cetirizine (ZYRTEC ALLERGY) 10 MG tablet Take 1 tablet (10 mg total) by mouth daily. 30 tablet 1  . docusate sodium (COLACE) 100 MG capsule Take 1 capsule (100 mg total) by mouth 2 (two) times daily. 10 capsule 0  . fluticasone (FLONASE) 50 MCG/ACT nasal spray Place 2 sprays into both nostrils daily. 16 g 1  . ibuprofen  (ADVIL,MOTRIN) 800 MG tablet Take 1 tablet (800 mg total) by mouth every 8 (eight) hours as needed. 30 tablet 0  . loratadine-pseudoephedrine (CLARITIN-D 24-HOUR) 10-240 MG per 24 hr tablet Take 1 tablet by mouth daily as needed for allergies.     . megestrol (MEGACE) 40 MG tablet Take 1 tablet (40 mg total) by mouth 2 (two) times daily. Can increase to two tablets twice a day in the event of heavy bleeding 60 tablet 5  . oxyCODONE-acetaminophen (PERCOCET) 10-325 MG tablet Take 1 tablet by mouth every 8 (eight) hours as needed for pain. 20 tablet 0  . promethazine (PHENERGAN) 25 MG tablet Take 1 tablet (25 mg total) by mouth every 8 (eight) hours as needed for nausea or vomiting. 20 tablet 0  . SUMAtriptan (IMITREX) 100 MG tablet Take one at onset of severe migraine; may repeat in 2 hours. Limit 2/24 hrs    . traZODone (DESYREL) 100 MG tablet Take 100 mg by mouth at bedtime.    . triamcinolone cream (KENALOG) 0.5 % Apply topically.     No current facility-administered medications on file prior to visit.     Allergies  Allergen Reactions  . Coconut Oil Swelling  . Other Swelling    Tongue swelled Tongue swelled  . Coconut Flavor   . Ampicillin Rash  . Latex Rash    Objective: There were no vitals filed for this visit.  General: No acute distress, AAOx3  Left foot: Sutures intact with no gapping or dehiscence at surgical sites, mild swelling to 5th  toe on Left foot, no erythema, no warmth, no drainage, no signs of infection noted, Capillary fill time <3 seconds in all digits, gross sensation present via light touch to Left foot. No pain or crepitation with range of motion right foot.  No pain with calf compression.   Assessment and Plan:  Problem List Items Addressed This Visit    None    Visit Diagnoses    Hammertoe of left foot    -  Primary   Corn of toe       Pain of toe of left foot       S/P foot surgery, left          -Patient seen and evaluated -Sutures removed,  steristrips applied.  -Applied dry sterile dressing to surgical site left foot secured with coban wrap and stockinet  -Advised patient to make sure to keep dressings clean, dry, and intact to left surgical site until next week may remove and shower -Advised patient to continue with post-op shoe on left -Advised patient to limit activity to necessity  -Advised patient to ice and elevate as necessary  -Continue with PRN meds -Will plan for transition out of post op shoe at next office visit. In the meantime, patient to call office if any issues or problems arise.   Landis Martins, DPM

## 2018-05-18 IMAGING — CT CT HEAD W/O CM
4 series · 16 of 47 positions shown, 18 images · non-contrast
Comparison: None.

CLINICAL DATA: All over headache starting today. Previous history
of migraines.

EXAM:
CT HEAD WITHOUT CONTRAST
TECHNIQUE: Contiguous axial images were obtained from the base of the skull
through the vertex without intravenous contrast.

[Series 2: head without · axial · non-contrast · 0.44mm/px · z∈[+1446,+1566]mm · 7 of 32 slices shown, 9 images]
[im 4/32  brain]
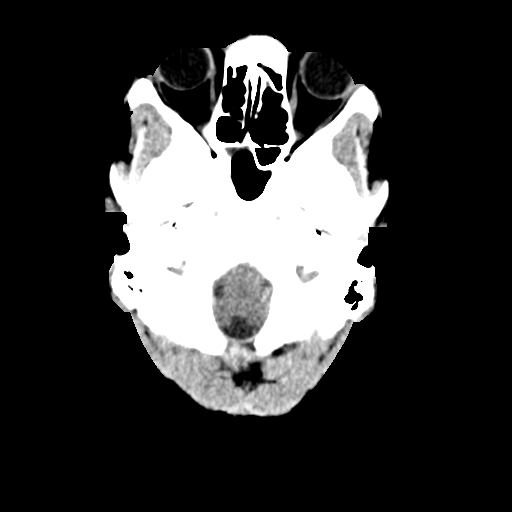
[im 4/32  bone]
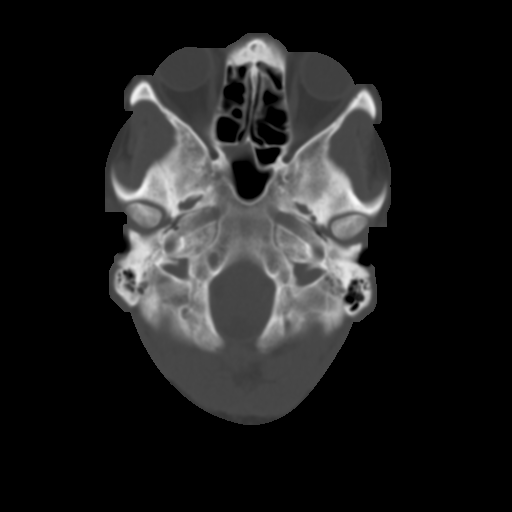
[im 8/32  brain]
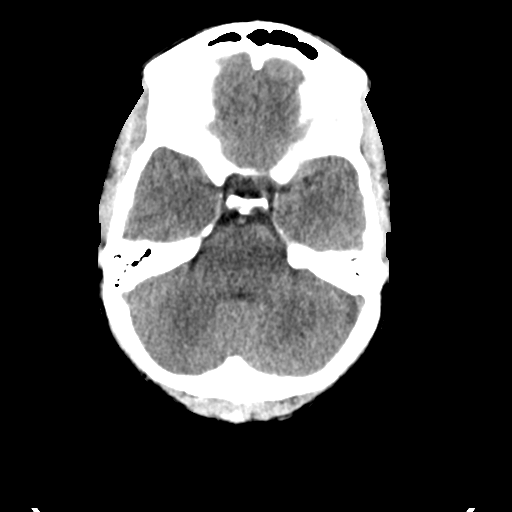
[im 12/32  brain]
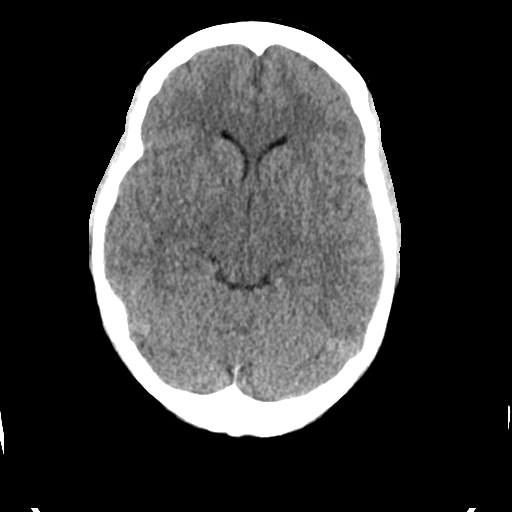
[im 16/32  brain]
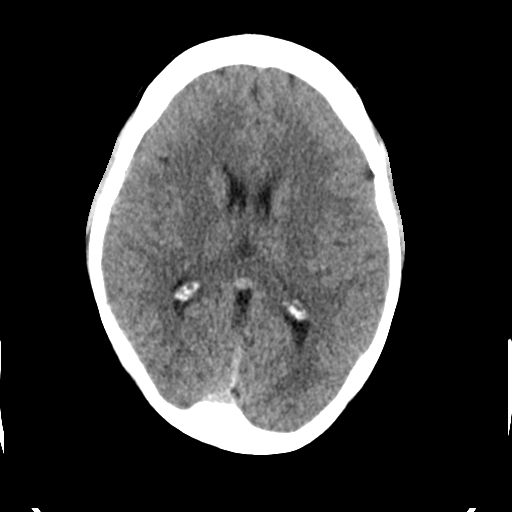
[im 20/32  brain]
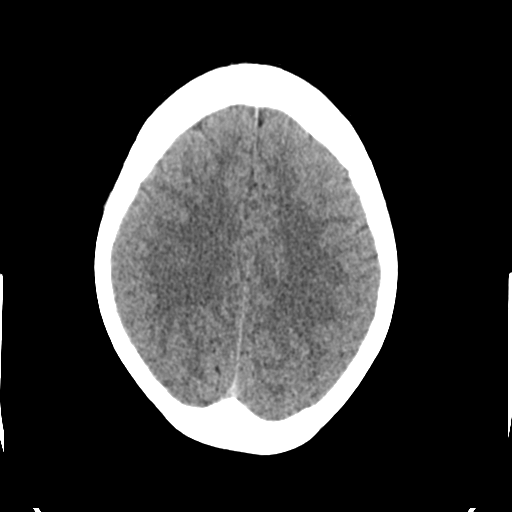
[im 20/32  bone]
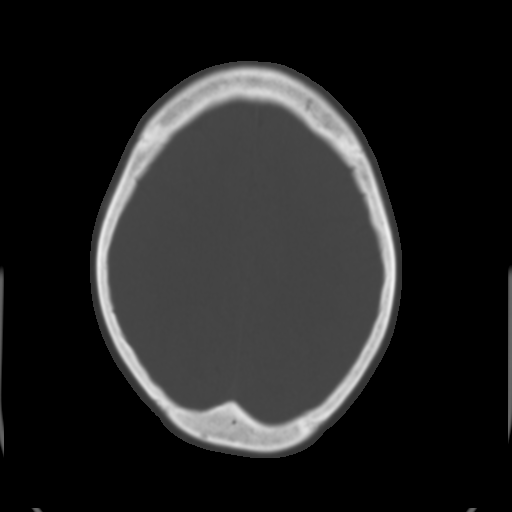
[im 24/32  brain]
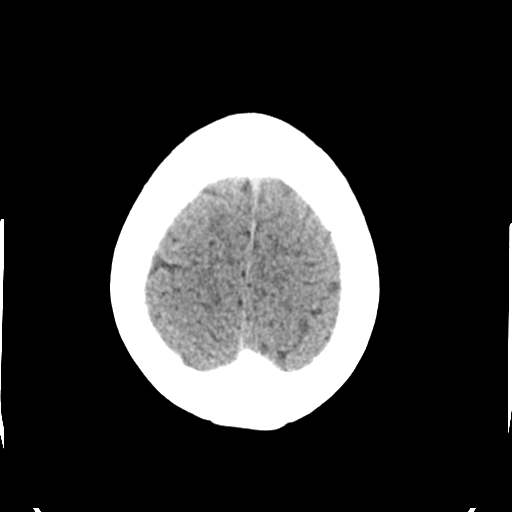
[im 28/32  brain]
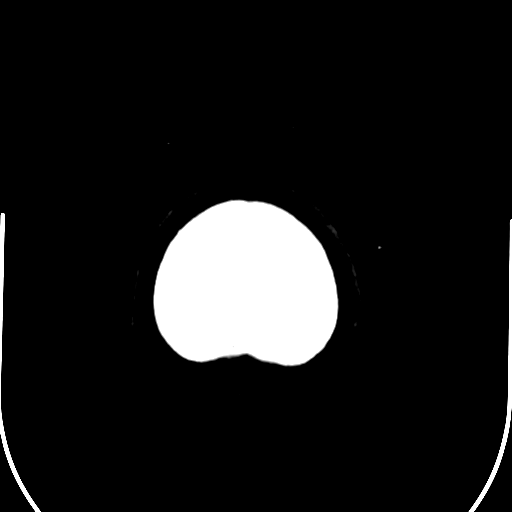

[Series 3: head bone · axial · 0.44mm/px · z∈[+1445,+1477]mm · 3 of 79 slices shown]
[im 8/79  bone]
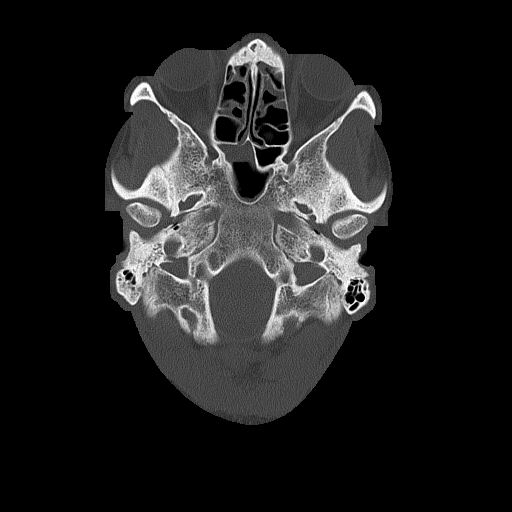
[im 16/79  bone]
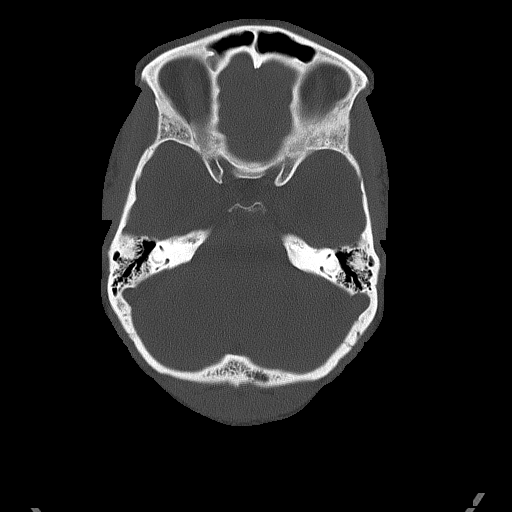
[im 24/79  bone]
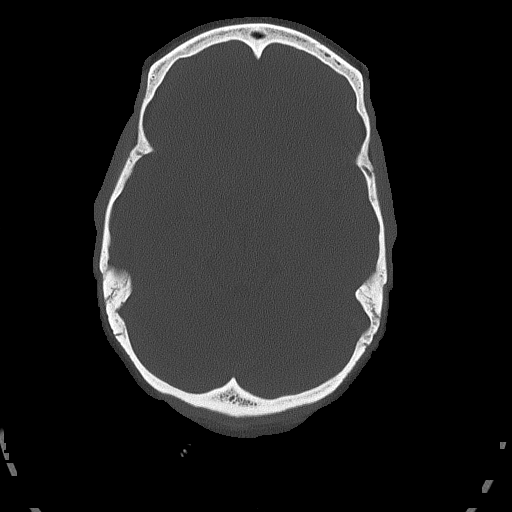

[Series 4: head without cor · coronal · non-contrast · 0.30mm/px · 3 of 68 slices shown]
[im 23/68  brain]
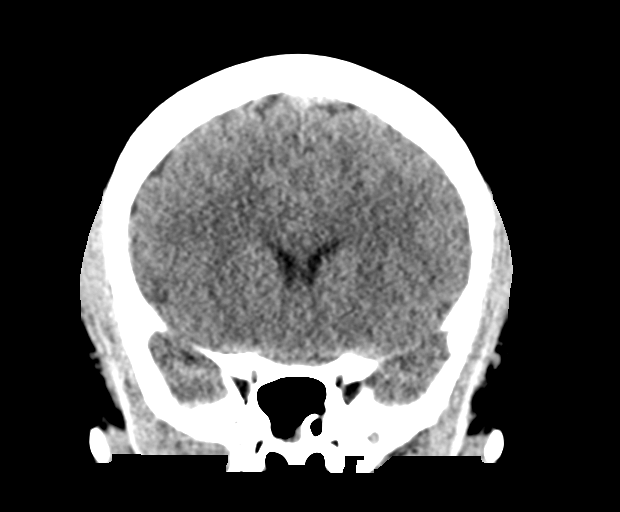
[im 30/68  brain]
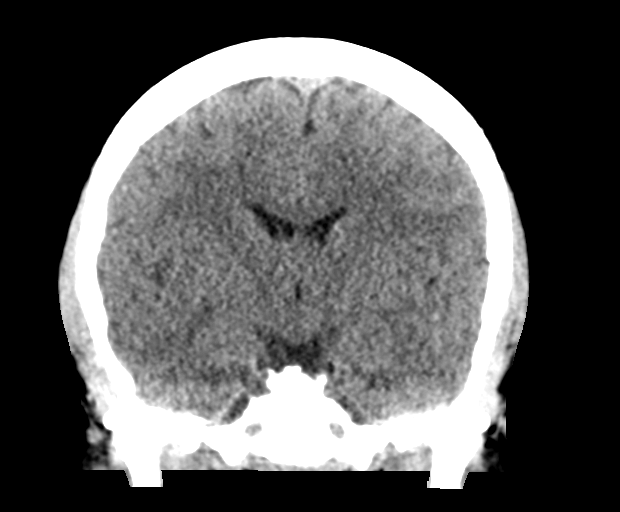
[im 38/68  brain]
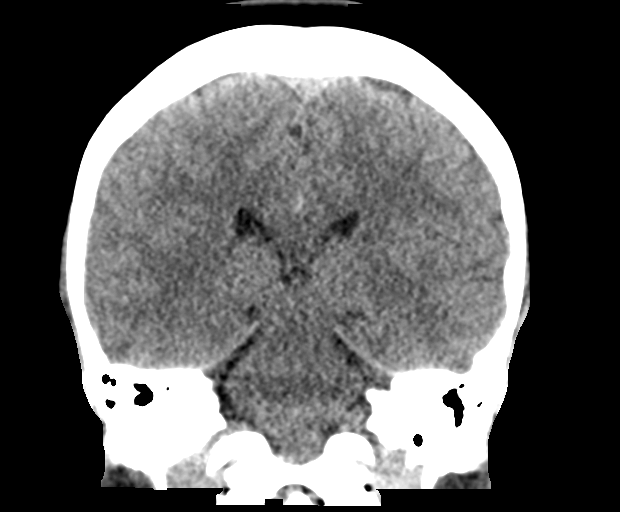

[Series 5: head without sag · sagittal · non-contrast · 0.31mm/px · 3 of 53 slices shown]
[im 18/53  brain]
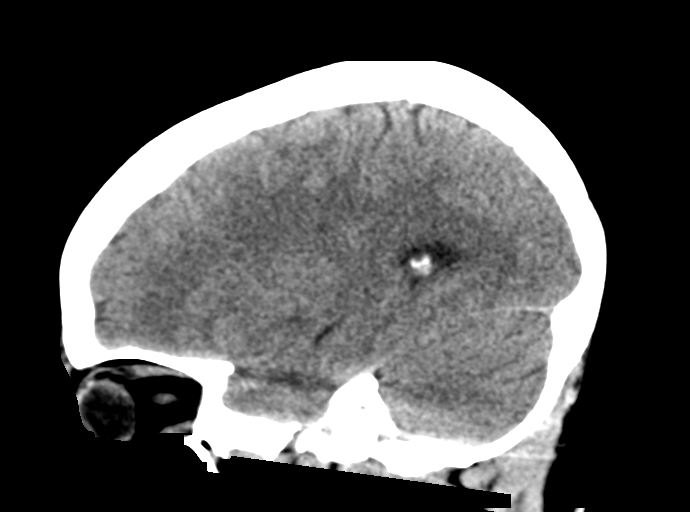
[im 27/53  brain]
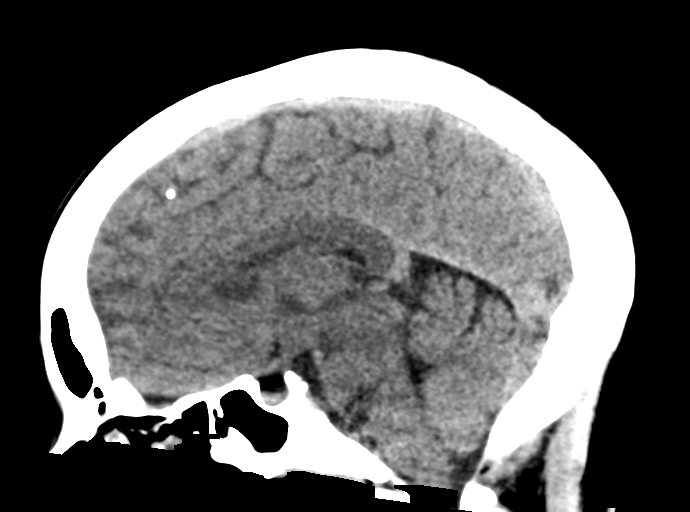
[im 35/53  brain]
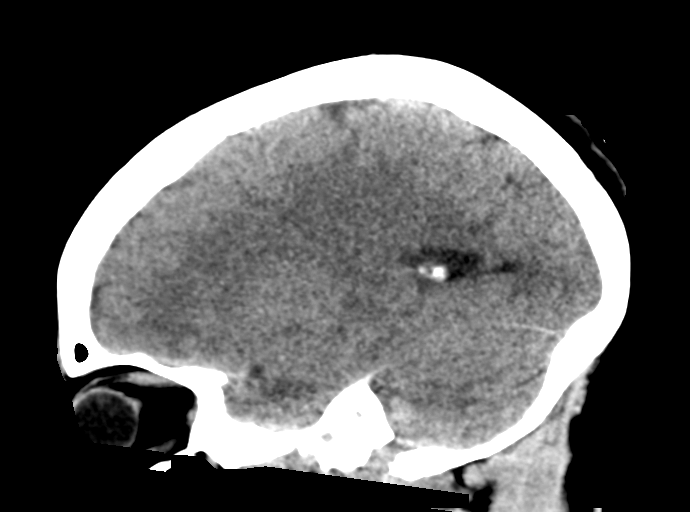

[16 of 47 positions shown; findings below may reference images not displayed]

FINDINGS: Brain: Ventricles and sulci appear symmetrical. No ventricular
dilatation. No mass effect or midline shift. No abnormal extra-axial
fluid collections. Gray-white matter junctions are distinct. Basal
cisterns are not effaced. No evidence of acute intracranial
hemorrhage.

Vascular: No hyperdense vessel or unexpected calcification.

Skull: No depressed skull fractures.

Sinuses/Orbits: Mucosal thickening throughout the paranasal sinuses.
No acute air-fluid levels. Mastoid air cells are not opacified.

Other: None.
IMPRESSION: No acute intracranial abnormalities. Probable inflammatory changes
in the paranasal sinuses.

## 2018-05-31 ENCOUNTER — Encounter: Payer: Medicaid Other | Admitting: Sports Medicine

## 2018-06-07 ENCOUNTER — Encounter: Payer: Medicaid Other | Admitting: Sports Medicine

## 2018-06-14 ENCOUNTER — Encounter: Payer: Medicaid Other | Admitting: Sports Medicine

## 2018-06-21 ENCOUNTER — Encounter: Payer: Self-pay | Admitting: Sports Medicine

## 2018-06-21 ENCOUNTER — Encounter: Payer: Medicaid Other | Admitting: Sports Medicine

## 2018-06-21 NOTE — Progress Notes (Signed)
Patient was a no show.   This encounter was created in error - please disregard. 

## 2018-07-23 ENCOUNTER — Other Ambulatory Visit: Payer: Self-pay

## 2018-07-23 ENCOUNTER — Encounter (HOSPITAL_COMMUNITY): Payer: Self-pay

## 2018-07-23 ENCOUNTER — Emergency Department (HOSPITAL_COMMUNITY)
Admission: EM | Admit: 2018-07-23 | Discharge: 2018-07-23 | Disposition: A | Discharge status: Home / Self Care | Payer: Medicaid Other | Attending: Emergency Medicine | Admitting: Emergency Medicine

## 2018-07-23 DIAGNOSIS — Z91040 Latex allergy status: Secondary | ICD-10-CM | POA: Diagnosis not present

## 2018-07-23 DIAGNOSIS — J45909 Unspecified asthma, uncomplicated: Secondary | ICD-10-CM | POA: Insufficient documentation

## 2018-07-23 DIAGNOSIS — Z79899 Other long term (current) drug therapy: Secondary | ICD-10-CM | POA: Diagnosis not present

## 2018-07-23 DIAGNOSIS — F17210 Nicotine dependence, cigarettes, uncomplicated: Secondary | ICD-10-CM | POA: Insufficient documentation

## 2018-07-23 DIAGNOSIS — M65052 Abscess of tendon sheath, left thigh: Secondary | ICD-10-CM | POA: Diagnosis not present

## 2018-07-23 DIAGNOSIS — L02416 Cutaneous abscess of left lower limb: Secondary | ICD-10-CM

## 2018-07-23 DIAGNOSIS — M79652 Pain in left thigh: Secondary | ICD-10-CM | POA: Diagnosis present

## 2018-07-23 LAB — POC URINE PREG, ED: Preg Test, Ur: NEGATIVE

## 2018-07-23 MED ORDER — TETANUS-DIPHTH-ACELL PERTUSSIS 5-2.5-18.5 LF-MCG/0.5 IM SUSP
0.5000 mL | Freq: Once | INTRAMUSCULAR | Status: AC
  Administered 2018-07-23: 19:00:00 0.5 mL via INTRAMUSCULAR
  Filled 2018-07-23: qty 0.5

## 2018-07-23 MED ORDER — LIDOCAINE-EPINEPHRINE-TETRACAINE (LET) SOLUTION
3.0000 mL | Freq: Once | NASAL | Status: AC
  Administered 2018-07-23: 3 mL via TOPICAL
  Filled 2018-07-23: qty 3

## 2018-07-23 MED ORDER — HYDROCODONE-ACETAMINOPHEN 5-325 MG PO TABS
2.0000 | ORAL_TABLET | Freq: Once | ORAL | Status: AC
  Administered 2018-07-23: 2 via ORAL
  Filled 2018-07-23: qty 2

## 2018-07-23 MED ORDER — DOXYCYCLINE HYCLATE 100 MG PO CAPS: 100.0000 mg | ORAL_CAPSULE | Freq: Two times a day (BID) | ORAL | 0 refills | Status: AC

## 2018-07-23 MED ORDER — LIDOCAINE-EPINEPHRINE (PF) 2 %-1:200000 IJ SOLN
10.0000 mL | Freq: Once | INTRAMUSCULAR | Status: AC
  Administered 2018-07-23: 10 mL
  Filled 2018-07-23: qty 20

## 2018-07-23 NOTE — ED Provider Notes (Signed)
[facility] Provider Note   CSN: [id number] Arrival date & time: 07/23/18  1722    History   Chief Complaint Chief Complaint  Patient presents with  . Abscess    HPI Diane Garrison is a 33 y.o. female presenting today for abscess of the left upper thigh.  She reports that she has had pain and swelling in this area for the past 2 weeks, moderate intensity throbbing sensation worse with palpation of the area without alleviating factors.  Patient has attempted heat and picking at the area without success.  Patient reports that she occasionally shaves her bikini line and that she has had an abscess a few years ago and she presented similarly necessitating incision and drainage.  Patient denies fever/chills, cough/shortness of breath, chest pain, abdominal pain, nausea/vomiting, extremity numbness weakness or tingling, vaginal bleeding/discomfort, dysuria/hematuria, rectal pain or any additional concerns.     HPI  Past Medical History:  Diagnosis Date  . Allergy   . Anemia   . Anxiety   . Asthma   . Bipolar disorder ([place])    Bipolar 2  . Cigarette smoker   . Depression   . Insomnia   . Migraine     Patient Active Problem List   Diagnosis Date Noted  . DUB (dysfunctional uterine bleeding) 04/13/2018  . Abnormal auditory perception of both ears 10/15/2017  . Bilateral temporomandibular joint pain 10/15/2017  . Ear pressure, bilateral 10/15/2017  . Moderate persistent asthma without complication 62/13/0865  . Tobacco use 08/02/2014  . Excessive or frequent menstruation 02/13/2013  . Allergic rhinitis 07/13/2012  . Headache, menstrual migraine 07/13/2012  . Vitamin D deficiency 01/05/2010  . Hypertrophy of breast 02/04/2009  . Mastodynia 02/04/2009  . Contact dermatitis and other eczema, due to unspecified cause 07/19/2008  . Herpes simplex virus (HSV) infection 07/19/2008    Past Surgical History:  Procedure Laterality  Date  . CESAREAN SECTION  2004,2006,2007,2008   x 4  . CHOLECYSTECTOMY    . DILITATION & CURRETTAGE/HYSTROSCOPY WITH HYDROTHERMAL ABLATION N/A 03/16/2014   Procedure: DILATATION & CURETTAGE/HYSTEROSCOPY WITH HYDROTHERMAL ABLATION;  Surgeon: Shelly Bombard, MD;  Location: [facility];  Service: Gynecology;  Laterality: N/A;  . FOOT SURGERY  2018  . TUBAL LIGATION  03/20/2007  . WISDOM TOOTH EXTRACTION       OB History   No obstetric history on file.      Home Medications    Prior to Admission medications   Medication Sig Start Date End Date Taking? Authorizing Provider  albuterol (PROVENTIL HFA;VENTOLIN HFA) 108 (90 BASE) MCG/ACT inhaler Inhale 2 puffs into the lungs every 6 (six) hours as needed. For shortness of breath.   Yes [provider]  beclomethasone (QVAR) 40 MCG/ACT inhaler Inhale 1 puff into the lungs daily as needed (shortness of breath).    Yes [provider]  triamcinolone cream (KENALOG) 0.5 % Apply 1 application topically daily.  09/22/17  Yes [provider]  cetirizine (ZYRTEC ALLERGY) 10 MG tablet Take 1 tablet (10 mg total) by mouth daily. Patient not taking: Reported on 07/23/2018 09/26/14   Waynetta Pean, PA-C  docusate sodium (COLACE) 100 MG capsule Take 1 capsule (100 mg total) by mouth 2 (two) times daily. Patient not taking: Reported on 07/23/2018 05/02/18   Landis Martins, DPM  doxycycline (VIBRAMYCIN) 100 MG capsule Take 1 capsule (100 mg total) by mouth 2 (two) times daily for 7 days. 07/23/18 07/30/18  Deliah Boston, PA-C  fluticasone (FLONASE) 50 MCG/ACT nasal spray Place 2 sprays into both nostrils daily. Patient not taking: Reported on 07/23/2018 09/26/14   Waynetta Pean, PA-C  ibuprofen (ADVIL,MOTRIN) 800 MG tablet Take 1 tablet (800 mg total) by mouth every 8 (eight) hours as needed. Patient not taking: Reported on 07/23/2018 05/02/18   Landis Martins, DPM  megestrol (MEGACE) 40 MG tablet Take 1 tablet (40 mg total) by mouth 2  (two) times daily. Can increase to two tablets twice a day in the event of heavy bleeding Patient not taking: Reported on 07/23/2018 07/28/16   Chancy Milroy, MD  oxyCODONE-acetaminophen (PERCOCET) 10-325 MG tablet Take 1 tablet by mouth every 8 (eight) hours as needed for pain. Patient not taking: Reported on 07/23/2018 05/02/18   Landis Martins, DPM  promethazine (PHENERGAN) 25 MG tablet Take 1 tablet (25 mg total) by mouth every 8 (eight) hours as needed for nausea or vomiting. Patient not taking: Reported on 07/23/2018 05/02/18   Landis Martins, DPM    Family History Family History  Problem Relation Age of Onset  . Heart disease Mother   . Heart disease Maternal Grandmother     Social History Social History   Tobacco Use  . Smoking status: Current Some Day Smoker    Packs/day: 0.50    Years: 3.00    Pack years: 1.50    Types: Cigarettes  . Smokeless tobacco: Never Used  Substance Use Topics  . Alcohol use: No    Alcohol/week: 0.0 standard drinks  . Drug use: Yes    Types: Marijuana     Allergies   Coconut oil; Other; Tape; Coconut flavor; Ampicillin; and Latex   Review of Systems Review of Systems  Constitutional: Negative.  Negative for chills and fever.  Respiratory: Negative.  Negative for cough and shortness of breath.   Cardiovascular: Negative.  Negative for chest pain.  Gastrointestinal: Negative.  Negative for abdominal pain, nausea and vomiting.  Genitourinary: Negative.  Negative for dysuria and hematuria.  Musculoskeletal: Negative.  Negative for arthralgias and myalgias.  Skin: Positive for wound (Abscess left upper leg).  All other systems reviewed and are negative.  Physical Exam Updated Vital Signs BP 111/73 (BP Location: Right Arm)   Pulse 68   Temp 98.7 F (37.1 C) (Oral)   Resp 16   Ht 5' 1.5" (1.562 m)   Wt 69.9 kg   LMP 07/11/2018   SpO2 97%   BMI 28.63 kg/m   Physical Exam Constitutional:      General: She is not in acute distress.     Appearance: Normal appearance. She is well-developed. She is not ill-appearing or diaphoretic.  HENT:     Head: Normocephalic and atraumatic.     Right Ear: External ear normal.     Left Ear: External ear normal.     Nose: Nose normal.  Eyes:     General: Vision grossly intact. Gaze aligned appropriately.     Pupils: Pupils are equal, round, and reactive to light.  Neck:     Musculoskeletal: Normal range of motion.     Trachea: Trachea and phonation normal. No tracheal deviation.  Pulmonary:     Effort: Pulmonary effort is normal. No respiratory distress.  Abdominal:     General: There is no distension.     Palpations: Abdomen is soft.     Tenderness: There is no abdominal tenderness. There is no guarding or rebound.  Musculoskeletal: Normal range of motion.  Skin:    General: Skin is  warm and dry.          Comments: GU examination chaperoned by Lysbeth Galas nurse tech.  Abscess present to the left upper thigh fold on the edge of patient's pubic hair line.  4 cm in diameter area of induration with 1-2 cm of fluctuance in the center.  Appears to have come to a head and have been draining recently.  Tenderness to palpation directly over abscess.  No tenderness medial or towards the vagina or labia.  No streaking.  Neurological:     Mental Status: She is alert.     GCS: GCS eye subscore is 4. GCS verbal subscore is 5. GCS motor subscore is 6.     Comments: Speech is clear and goal oriented, follows commands Major Cranial nerves without deficit, no facial droop Moves extremities without ataxia, coordination intact  Psychiatric:        Behavior: Behavior normal.    ED Treatments / Results  Labs (all labs ordered are listed, but only abnormal results are displayed) Labs Reviewed  POC URINE PREG, ED    EKG None  Radiology No results found.  Procedures Ultrasound ED Soft Tissue Date/Time: 07/23/2018 6:55 PM Performed by: Deliah Boston, PA-C Authorized by: Deliah Boston, PA-C   Procedure details:    Indications: localization of abscess     Transverse view:  Visualized   Longitudinal view:  Visualized   Images: archived   Location:    Location: groin     Side:  Left Findings:     abscess present    no foreign body present Comments:     No major vessels in proximity to abscess seen on [place]. Marland Kitchen.Incision and Drainage Date/Time: 07/23/2018 6:56 PM Performed by: Deliah Boston, PA-C Authorized by: Deliah Boston, PA-C   Consent:    Consent obtained:  Verbal   Consent given by:  Patient   Risks discussed:  Bleeding, damage to other organs, incomplete drainage, infection and pain Location:    Type:  Abscess   Size:  2   Location:  Lower extremity   Lower extremity location:  Leg   Leg location:  L upper leg Pre-procedure details:    Skin preparation:  Betadine Anesthesia (see MAR for exact dosages):    Anesthesia method:  Local infiltration   Local anesthetic:  Lidocaine 1% WITH epi Procedure type:    Complexity:  Simple Procedure details:    Incision types:  Single straight   Scalpel blade:  15   Wound management:  Probed and deloculated   Drainage:  Purulent   Drainage amount:  Moderate   Wound treatment:  Wound left open   Packing materials:  1/2 in iodoform gauze   Amount 1/2" iodoform:  2cm in 2cm out Comments:     Superficial incision made along previously draining area.  Procedure chaperoned by Lysbeth Galas nurse tech.   (including critical care time)  Medications Ordered in ED Medications  Tdap (BOOSTRIX) injection 0.5 mL (has no administration in time range)  HYDROcodone-acetaminophen (NORCO/VICODIN) 5-325 MG per tablet 2 tablet (2 tablets Oral Given 07/23/18 1803)  lidocaine-EPINEPHrine (XYLOCAINE W/EPI) 2 %-1:200000 (PF) injection 10 mL (10 mLs Infiltration Given 07/23/18 1804)  lidocaine-EPINEPHrine-tetracaine (LET) solution (3 mLs Topical Given 07/23/18 1803)     Initial Impression / Assessment and Plan / ED  Course  I have reviewed the triage vital signs and the nursing notes.  Pertinent labs & imaging results that were available during my care of the patient  were reviewed by me and considered in my medical decision making (see chart for details).    33 year old female presents today with single skin abscess of the left upper thigh.  Abscess was visualized with ultrasound without major vascular involvement and was amenable to incision and drainage.  It appears that the abscess had recently come to a head; a superficial incision was made over formally draining area with immediate expression of purulent material. Area was cleaned thoroughly and packed with iodoform gauze and then bandaged by nursing staff.  Patient reports improvement of pain following incision and drainage today.  No bleeding or evidence of vascular injury following incision and drainage today; patient with redundant tissue over the left thigh and suprapubic area.  Patient encouraged to have wound rechecked in 48 hours at PCP, urgent care or here in ED for packing removal and recheck.  Case discussed with Dr. Ellender Hose who agrees with covering patient with doxycycline twice daily x7 days. Plan to discharge patient. Patient given extensive return precautions. Patient states understanding of return precautions and is agreeable with plan.   Of note patient was given 2 Norco here in the emergency department and has her husband to drive her home today.  Tdap updated today.  At this time there does not appear to be any evidence of an acute emergency medical condition and the patient appears stable for discharge with appropriate outpatient follow up. Diagnosis was discussed with patient who verbalizes understanding of care plan and is agreeable to discharge. I have discussed return precautions with patient who verbalizes understanding of return precautions. Patient encouraged to follow-up with their PCP and have wound rechecked in 2 days. All questions  answered.  Patient has been discharged in good condition.  Patient's case discussed with Dr. Ellender Hose who agrees with plan to discharge with follow-up.   Note: Portions of this report may have been transcribed using voice recognition software. Every effort was made to ensure accuracy; however, inadvertent computerized transcription errors may still be present. Final Clinical Impressions(s) / ED Diagnoses   Final diagnoses:  Abscess of left thigh    ED Discharge Orders         Ordered    doxycycline (VIBRAMYCIN) 100 MG capsule  2 times daily     07/23/18 1907           Gari Crown 07/23/18 Karsten Fells, MD 07/25/18 1023

## 2018-07-23 NOTE — ED Notes (Signed)
Patient verbalizes understanding of discharge instructions . Opportunity for questions and answers were provided . Armband removed by staff ,Pt discharged from ED. W/C  offered at D/C  and Declined W/C at D/C and was escorted to lobby by RN.  

## 2018-07-23 NOTE — Discharge Instructions (Addendum)
You have been diagnosed today with Abscess of the left upper thigh.  At this time there does not appear to be the presence of an emergent medical condition, however there is always the potential for conditions to change. Please read and follow the below instructions.  Please return to the Emergency Department immediately for any new or worsening symptoms. Please be sure to follow up with your Primary Care Provider within one week regarding your visit today; please call their office to schedule an appointment even if you are feeling better for a follow-up visit. Please have your abscess rechecked in TWO days to ensure healing and for packing removal. You may have your checkup here at the Emergency Department, at an urgent care or at your primary care providers office. Please take the antibiotic Doxycycline as prescribed, 2 times daily until completed.  Get help right away if: You have very bad (severe) pain. You see red streaks on your skin spreading away from the abscess. Get help right away if: You have severe pain or bleeding. You cannot eat or drink without vomiting. You have decreased urine output. You become short of breath. You have chest pain. You cough up blood. The area where the incision and drainage occurred becomes numb or it tingle You have fever Any new/concerning or worsensing  Please read the additional information packets attached to your discharge summary.  Do not take your medicine if  develop an itchy rash, swelling in your mouth or lips, or difficulty breathing. Call 911/seek emergency medical assistance immediately if this occurs.

## 2018-07-23 NOTE — ED Triage Notes (Signed)
Pt has had abcess on left groin for 2 weeks. She has tried multiple treatments at home with no relief.

## 2018-07-26 ENCOUNTER — Encounter (HOSPITAL_COMMUNITY): Payer: Self-pay | Admitting: Emergency Medicine

## 2018-07-26 ENCOUNTER — Other Ambulatory Visit: Payer: Self-pay

## 2018-07-26 ENCOUNTER — Emergency Department (HOSPITAL_COMMUNITY)
Admission: EM | Admit: 2018-07-26 | Discharge: 2018-07-26 | Disposition: A | Discharge status: Home / Self Care | Payer: Medicaid Other | Attending: Emergency Medicine | Admitting: Emergency Medicine

## 2018-07-26 DIAGNOSIS — Z91040 Latex allergy status: Secondary | ICD-10-CM | POA: Diagnosis not present

## 2018-07-26 DIAGNOSIS — J45909 Unspecified asthma, uncomplicated: Secondary | ICD-10-CM | POA: Diagnosis not present

## 2018-07-26 DIAGNOSIS — F17210 Nicotine dependence, cigarettes, uncomplicated: Secondary | ICD-10-CM | POA: Diagnosis not present

## 2018-07-26 DIAGNOSIS — Z4801 Encounter for change or removal of surgical wound dressing: Secondary | ICD-10-CM | POA: Diagnosis not present

## 2018-07-26 DIAGNOSIS — Z79899 Other long term (current) drug therapy: Secondary | ICD-10-CM | POA: Diagnosis not present

## 2018-07-26 DIAGNOSIS — L02214 Cutaneous abscess of groin: Secondary | ICD-10-CM | POA: Insufficient documentation

## 2018-07-26 DIAGNOSIS — Z5189 Encounter for other specified aftercare: Secondary | ICD-10-CM

## 2018-07-26 NOTE — Discharge Instructions (Addendum)
You abscess appears to be healing well. Continue with the antibiotic. Follow-up with your care provider as needed.

## 2018-07-26 NOTE — ED Notes (Signed)
Patient verbalizes understanding of discharge instructions. Opportunity for questioning and answers were provided. Armband removed by staff, pt discharged from ED.  

## 2018-07-26 NOTE — ED Provider Notes (Signed)
[facility] Provider Note   CSN: [id number] Arrival date & time: 07/26/18  1541    History   Chief Complaint Chief Complaint  Patient presents with  . Abscess    HPI Diane Garrison is a 33 y.o. female.     Patient presents for check of groin abscess that received I&D on 07/24/18. Patient states pain is improved. Is taking doxycycline as prescribed. No fever or chills.  The history is provided by the patient and medical records.  Wound Check  The current episode started 2 days ago. The problem has been gradually improving. Nothing aggravates the symptoms. Treatments tried: I&D. The treatment provided significant relief.    Past Medical History:  Diagnosis Date  . Allergy   . Anemia   . Anxiety   . Asthma   . Bipolar disorder ([place])    Bipolar 2  . Cigarette smoker   . Depression   . Insomnia   . Migraine     Patient Active Problem List   Diagnosis Date Noted  . DUB (dysfunctional uterine bleeding) 04/13/2018  . Abnormal auditory perception of both ears 10/15/2017  . Bilateral temporomandibular joint pain 10/15/2017  . Ear pressure, bilateral 10/15/2017  . Moderate persistent asthma without complication 70/62/3762  . Tobacco use 08/02/2014  . Excessive or frequent menstruation 02/13/2013  . Allergic rhinitis 07/13/2012  . Headache, menstrual migraine 07/13/2012  . Vitamin D deficiency 01/05/2010  . Hypertrophy of breast 02/04/2009  . Mastodynia 02/04/2009  . Contact dermatitis and other eczema, due to unspecified cause 07/19/2008  . Herpes simplex virus (HSV) infection 07/19/2008    Past Surgical History:  Procedure Laterality Date  . CESAREAN SECTION  2004,2006,2007,2008   x 4  . CHOLECYSTECTOMY    . DILITATION & CURRETTAGE/HYSTROSCOPY WITH HYDROTHERMAL ABLATION N/A 03/16/2014   Procedure: DILATATION & CURETTAGE/HYSTEROSCOPY WITH HYDROTHERMAL ABLATION;  Surgeon: Shelly Bombard, MD;  Location: [facility];   Service: Gynecology;  Laterality: N/A;  . FOOT SURGERY  2018  . TUBAL LIGATION  03/20/2007  . WISDOM TOOTH EXTRACTION       OB History   No obstetric history on file.      Home Medications    Prior to Admission medications   Medication Sig Start Date End Date Taking? Authorizing Provider  albuterol (PROVENTIL HFA;VENTOLIN HFA) 108 (90 BASE) MCG/ACT inhaler Inhale 2 puffs into the lungs every 6 (six) hours as needed. For shortness of breath.    [provider]  beclomethasone (QVAR) 40 MCG/ACT inhaler Inhale 1 puff into the lungs daily as needed (shortness of breath).     [provider]  cetirizine (ZYRTEC ALLERGY) 10 MG tablet Take 1 tablet (10 mg total) by mouth daily. Patient not taking: Reported on 07/23/2018 09/26/14   Waynetta Pean, PA-C  docusate sodium (COLACE) 100 MG capsule Take 1 capsule (100 mg total) by mouth 2 (two) times daily. Patient not taking: Reported on 07/23/2018 05/02/18   Landis Martins, DPM  doxycycline (VIBRAMYCIN) 100 MG capsule Take 1 capsule (100 mg total) by mouth 2 (two) times daily for 7 days. 07/23/18 07/30/18  Nuala Alpha A, PA-C  fluticasone (FLONASE) 50 MCG/ACT nasal spray Place 2 sprays into both nostrils daily. Patient not taking: Reported on 07/23/2018 09/26/14   Waynetta Pean, PA-C  ibuprofen (ADVIL,MOTRIN) 800 MG tablet Take 1 tablet (800 mg total) by mouth every 8 (eight) hours as needed. Patient not taking: Reported on 07/23/2018 05/02/18   Landis Martins, DPM  megestrol (MEGACE) 40 MG tablet Take 1 tablet (40 mg total) by mouth 2 (two) times daily. Can increase to two tablets twice a day in the event of heavy bleeding Patient not taking: Reported on 07/23/2018 07/28/16   Chancy Milroy, MD  oxyCODONE-acetaminophen (PERCOCET) 10-325 MG tablet Take 1 tablet by mouth every 8 (eight) hours as needed for pain. Patient not taking: Reported on 07/23/2018 05/02/18   Landis Martins, DPM  promethazine (PHENERGAN) 25 MG tablet Take 1  tablet (25 mg total) by mouth every 8 (eight) hours as needed for nausea or vomiting. Patient not taking: Reported on 07/23/2018 05/02/18   Landis Martins, DPM  triamcinolone cream (KENALOG) 0.5 % Apply 1 application topically daily.  09/22/17   [provider]    Family History Family History  Problem Relation Age of Onset  . Heart disease Mother   . Heart disease Maternal Grandmother     Social History Social History   Tobacco Use  . Smoking status: Current Some Day Smoker    Packs/day: 0.50    Years: 3.00    Pack years: 1.50    Types: Cigarettes  . Smokeless tobacco: Never Used  Substance Use Topics  . Alcohol use: No    Alcohol/week: 0.0 standard drinks  . Drug use: Yes    Types: Marijuana     Allergies   Coconut oil; Other; Tape; Coconut flavor; Ampicillin; and Latex   Review of Systems Review of Systems  All other systems reviewed and are negative.    Physical Exam Updated Vital Signs BP (!) 116/54 (BP Location: Right Arm)   Pulse 86   Temp 99 F (37.2 C) (Oral)   Resp 15   LMP 07/11/2018   SpO2 99%   Physical Exam Vitals signs and nursing note reviewed.  HENT:     Head: Atraumatic.  Eyes:     Conjunctiva/sclera: Conjunctivae normal.  Cardiovascular:     Rate and Rhythm: Normal rate.  Pulmonary:     Effort: Pulmonary effort is normal.  Abdominal:     Palpations: Abdomen is soft.  Musculoskeletal: Normal range of motion.  Skin:    General: Skin is warm and dry.     Comments: Packing removed from groin abscess. Minimal purulent drainage noted on dressing. No increased warmth. Minimal induration.   Neurological:     Mental Status: She is alert and oriented to person, place, and time.  Psychiatric:        Mood and Affect: Mood normal.      ED Treatments / Results  Labs (all labs ordered are listed, but only abnormal results are displayed) Labs Reviewed - No data to display  EKG None  Radiology No results found.  Procedures  Procedures (including critical care time)  Medications Ordered in ED Medications - No data to display   Initial Impression / Assessment and Plan / ED Course  I have reviewed the triage vital signs and the nursing notes.  Pertinent labs & imaging results that were available during my care of the patient were reviewed by me and considered in my medical decision making (see chart for details).        Patient returns for check of recent incision and drainage. The region appears to be well-healing and infection appears to be resolving. Patient symptoms improved from prior visit. Afebrile and hemodynamically stable. Pt is instructed to continue with home care or antibiotics. Pt has a good understanding of return precautions and is safe for discharge at  this time.  Final Clinical Impressions(s) / ED Diagnoses   Final diagnoses:  Wound check, abscess    ED Discharge Orders    None       Etta Quill, NP 07/26/18 1741    Carmin Muskrat, MD 07/27/18 [phone]

## 2018-07-26 NOTE — ED Triage Notes (Addendum)
Pt arrives to ED from home with complaints of a left groin abscess that she wants to get re evaulated today because she had an I&D two days ago. Pt stated she was told to come back to the ER for package removal and to see if it was healing properly. Pt denies any extra pain or drainage.

## 2018-08-02 ENCOUNTER — Other Ambulatory Visit: Payer: Self-pay

## 2018-08-02 ENCOUNTER — Encounter: Payer: Self-pay | Admitting: Obstetrics and Gynecology

## 2018-08-02 ENCOUNTER — Ambulatory Visit: Payer: Medicaid Other | Admitting: Obstetrics and Gynecology

## 2018-08-02 VITALS — BP 120/74 | HR 83 | Wt 165.8 lb

## 2018-08-02 DIAGNOSIS — N938 Other specified abnormal uterine and vaginal bleeding: Secondary | ICD-10-CM | POA: Diagnosis not present

## 2018-08-02 NOTE — H&P (View-Only) (Signed)
Diane Garrison is here to discuss surgery for her DUB. See prior office notes. Still having DUB Desires surgerical management  PE AF VSS Lungs clear Heart RRR Abd soft + BS GU deferred  A/P DUB  TVH/BS discussed with pt. R/B/Post op care reviewed. Medicaid paperwork signed. Surgery scheduled for June 30. F/U with post op appt.

## 2018-08-02 NOTE — Progress Notes (Signed)
Pt is in the office for surgical consult for hysterectomy.

## 2018-08-02 NOTE — Patient Instructions (Signed)
Vaginal Hysterectomy, Care After Refer to this sheet in the next few weeks. These instructions provide you with information about caring for yourself after your procedure. Your health care provider may also give you more specific instructions. Your treatment has been planned according to current medical practices, but problems sometimes occur. Call your health care provider if you have any problems or questions after your procedure. What can I expect after the procedure? After the procedure, it is common to have:  Pain.  Soreness and numbness in your incision areas.  Vaginal bleeding and discharge.  Constipation.  Temporary problems emptying the bladder.  Feelings of sadness or other emotions. Follow these instructions at home: Medicines  Take over-the-counter and prescription medicines only as told by your health care provider.  If you were prescribed an antibiotic medicine, take it as told by your health care provider. Do not stop taking the antibiotic even if you start to feel better.  Do not drive or operate heavy machinery while taking prescription pain medicine. Activity  Return to your normal activities as told by your health care provider. Ask your health care provider what activities are safe for you.  Get regular exercise as told by your health care provider. You may be told to take short walks every day and go farther each time.  Do not lift anything that is heavier than 10 lb (4.5 kg). General instructions   Do not put anything in your vagina for 6 weeks after your surgery or as told by your health care provider. This includes tampons and douches.  Do not have sex until your health care provider says you can.  Do not take baths, swim, or use a hot tub until your health care provider approves.  Drink enough fluid to keep your urine clear or pale yellow.  Do not drive for 24 hours if you were given a sedative.  Keep all follow-up visits as told by your health  care provider. This is important. Contact a health care provider if:  Your pain medicine is not helping.  You have a fever.  You have redness, swelling, or pain at your incision site.  You have blood, pus, or a bad-smelling discharge from your vagina.  You continue to have difficulty urinating. Get help right away if:  You have severe abdominal or back pain.  You have heavy bleeding from your vagina.  You have chest pain or shortness of breath. This information is not intended to replace advice given to you by your health care provider. Make sure you discuss any questions you have with your health care provider. Document Released: 06/10/2015 Document Revised: 07/25/2015 Document Reviewed: 03/03/2015 Elsevier Interactive Patient Education  2019 [employer] Vaginal Hysterectomy  A vaginal hysterectomy is a procedure to remove all or part of the uterus through a small incision in the vagina. In this procedure, your health care provider may remove your entire uterus, including the lower end (cervix). You may need a vaginal hysterectomy to treat:  Uterine fibroids.  A condition that causes the lining of the uterus to grow in other areas (endometriosis).  Problems with pelvic support.  Cancer of the cervix, ovaries, uterus, or tissue that lines the uterus (endometrium).  Excessive (dysfunctional) uterine bleeding. When removing your uterus, your health care provider may also remove the organs that produce eggs (ovaries) and the tubes that carry eggs to your uterus (fallopian tubes). After a vaginal hysterectomy, you will no longer be able to have a baby. You will also  no longer get your menstrual period. Tell a health care provider about:  Any allergies you have.  All medicines you are taking, including vitamins, herbs, eye drops, creams, and over-the-counter medicines.  Any problems you or family members have had with anesthetic medicines.  Any blood disorders you have.   Any surgeries you have had.  Any medical conditions you have.  Whether you are pregnant or may be pregnant. What are the risks? Generally, this is a safe procedure. However, problems may occur, including:  Bleeding.  Infection.  A blood clot that forms in your leg and travels to your lungs (pulmonary embolism).  Damage to surrounding organs.  Pain during sex. What happens before the procedure?  Ask your health care provider what organs will be removed during surgery.  Ask your health care provider about: ? Changing or stopping your regular medicines. This is especially important if you are taking diabetes medicines or blood thinners. ? Taking medicines such as aspirin and ibuprofen. These medicines can thin your blood. Do not take these medicines before your procedure if your health care provider instructs you not to.  Follow instructions from your health care provider about eating or drinking restrictions.  Do not use any tobacco products, such as cigarettes, chewing tobacco, and e-cigarettes. If you need help quitting, ask your health care provider.  Plan to have someone take you home after discharge from the hospital. What happens during the procedure?  To reduce your risk of infection: ? Your health care team will wash or sanitize their hands. ? Your skin will be washed with soap.  An IV tube will be inserted into one of your veins.  You may be given antibiotic medicine to help prevent infection.  You will be given one or more of the following: ? A medicine to help you relax (sedative). ? A medicine to numb the area (local anesthetic). ? A medicine to make you fall asleep (general anesthetic). ? A medicine that is injected into an area of your body to numb everything beyond the injection site (regional anesthetic).  Your surgeon will make an incision in your vagina.  Your surgeon will locate and remove all or part of your uterus.  Your ovaries and fallopian tubes  may be removed at the same time.  The incision will be closed with stitches (sutures) that dissolve over time. The procedure may vary among health care providers and hospitals. What happens after the procedure?  Your blood pressure, heart rate, breathing rate, and blood oxygen level will be monitored often until the medicines you were given have worn off.  You will be encouraged to get up and walk around after a few hours to help prevent complications.  You may have IV tubes in place for a few days.  You will be given pain medicine as needed.  Do not drive for 24 hours if you were given a sedative. This information is not intended to replace advice given to you by your health care provider. Make sure you discuss any questions you have with your health care provider. Document Released: 06/10/2015 Document Revised: 07/25/2015 Document Reviewed: 03/03/2015 Elsevier Interactive Patient Education  2019 [employer].

## 2018-08-02 NOTE — Progress Notes (Signed)
Diane Garrison is here to discuss surgery for her DUB. See prior office notes. Still having DUB Desires surgerical management  PE AF VSS Lungs clear Heart RRR Abd soft + BS GU deferred  A/P DUB  TVH/BS discussed with pt. R/B/Post op care reviewed. Medicaid paperwork signed. Surgery scheduled for June 30. F/U with post op appt.

## 2018-08-26 ENCOUNTER — Other Ambulatory Visit (HOSPITAL_COMMUNITY)
Admission: RE | Admit: 2018-08-26 | Discharge: 2018-08-26 | Disposition: A | Discharge status: Home / Self Care | Payer: Medicaid Other | Source: Ambulatory Visit | Attending: Obstetrics and Gynecology | Admitting: Obstetrics and Gynecology

## 2018-08-26 DIAGNOSIS — Z1159 Encounter for screening for other viral diseases: Secondary | ICD-10-CM | POA: Insufficient documentation

## 2018-08-26 LAB — SARS CORONAVIRUS 2 (TAT 6-24 HRS): SARS Coronavirus 2: NEGATIVE

## 2018-08-26 NOTE — H&P (Signed)
Diane Garrison is an 33 y.o. female G4P4 with a history of menorrhagia. She has tried OCP's ,oral progesterone and H/O endometrial ablation.   She has had a negative EMBX, U/S was unremarkable except for a small fibroid. She desires definite therapy now.  H/O c section x 4  H/O BTL  Menstrual History: Menarche age: 85 No LMP recorded. Patient has had an ablation.    Past Medical History:  Diagnosis Date  . Allergy   . Anemia   . Anxiety   . Asthma   . Bipolar disorder ([place])    Bipolar 2  . Cigarette smoker   . Depression   . Insomnia   . Migraine     Past Surgical History:  Procedure Laterality Date  . CESAREAN SECTION  2004,2006,2007,2008   x 4  . CHOLECYSTECTOMY    . DILITATION & CURRETTAGE/HYSTROSCOPY WITH HYDROTHERMAL ABLATION N/A 03/16/2014   Procedure: DILATATION & CURETTAGE/HYSTEROSCOPY WITH HYDROTHERMAL ABLATION;  Surgeon: Shelly Bombard, MD;  Location: [facility];  Service: Gynecology;  Laterality: N/A;  . FOOT SURGERY  2018  . TUBAL LIGATION  03/20/2007  . WISDOM TOOTH EXTRACTION      Family History  Problem Relation Age of Onset  . Heart disease Mother   . Heart disease Maternal Grandmother     Social History:  reports that she has been smoking cigarettes. She has a 1.50 pack-year smoking history. She has never used smokeless tobacco. She reports current drug use. Drug: Marijuana. She reports that she does not drink alcohol.  Allergies:  Allergies  Allergen Reactions  . Coconut Oil Swelling  . Tape Rash  . Benadryl [Diphenhydramine] Swelling    Tongue swelled  . Promethazine Nausea And Vomiting  . Ampicillin Rash    Did it involve swelling of the face/tongue/throat, SOB, or low BP? Unknown Did it involve sudden or severe rash/hives, skin peeling, or any reaction on the inside of your mouth or nose? Unknown Did you need to seek medical attention at a hospital or doctor's office? No When did it last happen?9 + year If all above answers  are "NO", may proceed with cephalosporin use. Pt was given ampicillin in hospital, was asleep when she reacted to it.   . Latex Rash    No medications prior to admission.    Review of Systems  Constitutional: Negative.   Cardiovascular: Negative.   Gastrointestinal: Negative.   Genitourinary: Negative.     There were no vitals taken for this visit. Physical Exam  Constitutional: She appears well-developed and well-nourished.  Neck: Normal range of motion. Neck supple.  Cardiovascular: Normal rate and regular rhythm.  Respiratory: Effort normal and breath sounds normal.  GI: Soft. Bowel sounds are normal.  Genitourinary:    Genitourinary Comments: Nl EGBUS Uterus @ 10 weeks size, mobile, non tender No adnexal masses or tenderness     No results found for this or any previous visit (from the past 24 hour(s)).  No results found.  Assessment/Plan: Menorrhagia refractory to medical therapy Uterine fibroid  Pt desires definite therapy, TVH/BS reviewed with pt. R/B/Post op care discussed . Pt desires to proceed.   Chancy Milroy 08/26/2018, 2:47 PM

## 2018-08-27 ENCOUNTER — Encounter (HOSPITAL_COMMUNITY): Payer: Self-pay | Admitting: Emergency Medicine

## 2018-08-27 ENCOUNTER — Ambulatory Visit (HOSPITAL_COMMUNITY)
Admission: EM | Admit: 2018-08-27 | Discharge: 2018-08-27 | Disposition: A | Discharge status: Home / Self Care | Payer: Medicaid Other | Attending: Physician Assistant | Admitting: Physician Assistant

## 2018-08-27 ENCOUNTER — Other Ambulatory Visit: Payer: Self-pay

## 2018-08-27 DIAGNOSIS — L0291 Cutaneous abscess, unspecified: Secondary | ICD-10-CM | POA: Diagnosis not present

## 2018-08-27 MED ORDER — DOXYCYCLINE HYCLATE 100 MG PO CAPS: 100.0000 mg | ORAL_CAPSULE | Freq: Two times a day (BID) | ORAL | 0 refills | Status: DC

## 2018-08-27 MED ORDER — CHLORHEXIDINE GLUCONATE 4 % EX LIQD: Freq: Every day | CUTANEOUS | 0 refills | Status: DC | PRN

## 2018-08-27 MED ORDER — BACITRACIN ZINC 500 UNIT/GM EX OINT
TOPICAL_OINTMENT | CUTANEOUS | Status: AC
  Filled 2018-08-27: qty 28.35

## 2018-08-27 MED ORDER — MUPIROCIN 2 % EX OINT: 1.0000 "application " | TOPICAL_OINTMENT | Freq: Two times a day (BID) | CUTANEOUS | 0 refills | Status: DC

## 2018-08-27 NOTE — ED Provider Notes (Signed)
[facility]    CSN: [id number] Arrival date & time: 08/27/18  1338     History   Chief Complaint Chief Complaint  Patient presents with  . Abscess    HPI Diane Garrison is a 33 y.o. female.   33 year old female comes in for few day history of abscess to the right vulva. States she gets these abscess every 2-3 months. She has tried warm compress without relief. She does not shave the area. Has not used new hygiene product. Area is enlarging and now with painful movement. Denies spreading erythema, fever.      Past Medical History:  Diagnosis Date  . Allergy   . Anemia   . Anxiety   . Asthma   . Bipolar disorder ([place])    Bipolar 2  . Cigarette smoker   . Depression   . Insomnia   . Migraine     Patient Active Problem List   Diagnosis Date Noted  . DUB (dysfunctional uterine bleeding) 04/13/2018  . Abnormal auditory perception of both ears 10/15/2017  . Bilateral temporomandibular joint pain 10/15/2017  . Moderate persistent asthma without complication 00/93/8182  . Tobacco use 08/02/2014  . Allergic rhinitis 07/13/2012  . Headache, menstrual migraine 07/13/2012  . Vitamin D deficiency 01/05/2010  . Hypertrophy of breast 02/04/2009  . Mastodynia 02/04/2009  . Contact dermatitis and other eczema, due to unspecified cause 07/19/2008  . Herpes simplex virus (HSV) infection 07/19/2008    Past Surgical History:  Procedure Laterality Date  . CESAREAN SECTION  2004,2006,2007,2008   x 4  . CHOLECYSTECTOMY    . DILITATION & CURRETTAGE/HYSTROSCOPY WITH HYDROTHERMAL ABLATION N/A 03/16/2014   Procedure: DILATATION & CURETTAGE/HYSTEROSCOPY WITH HYDROTHERMAL ABLATION;  Surgeon: Shelly Bombard, MD;  Location: [facility];  Service: Gynecology;  Laterality: N/A;  . FOOT SURGERY  2018  . TUBAL LIGATION  03/20/2007  . WISDOM TOOTH EXTRACTION      OB History   No obstetric history on file.      Home Medications    Prior to Admission medications    Medication Sig Start Date End Date Taking? Authorizing Provider  albuterol (PROVENTIL HFA;VENTOLIN HFA) 108 (90 BASE) MCG/ACT inhaler Inhale 2 puffs into the lungs every 6 (six) hours as needed for wheezing or shortness of breath. For shortness of breath.     [provider]  bisacodyl (DULCOLAX) 5 MG EC tablet Take 15 mg by mouth daily as needed for moderate constipation.    [provider]  cetirizine (ZYRTEC ALLERGY) 10 MG tablet Take 1 tablet (10 mg total) by mouth daily. Patient taking differently: Take 10 mg by mouth daily as needed for allergies.  09/26/14   Waynetta Pean, PA-C  chlorhexidine (HIBICLENS) 4 % external liquid Apply topically daily as needed. 08/27/18   Tasia Catchings, Amy V, PA-C  doxycycline (VIBRAMYCIN) 100 MG capsule Take 1 capsule (100 mg total) by mouth 2 (two) times daily. 08/27/18   Tasia Catchings, Amy V, PA-C  fluticasone (FLONASE) 50 MCG/ACT nasal spray Place 2 sprays into both nostrils daily. Patient taking differently: Place 2 sprays into both nostrils daily as needed for allergies.  09/26/14   Waynetta Pean, PA-C  ibuprofen (ADVIL,MOTRIN) 800 MG tablet Take 1 tablet (800 mg total) by mouth every 8 (eight) hours as needed. Patient taking differently: Take 800 mg by mouth every 8 (eight) hours as needed for moderate pain.  05/02/18   Landis Martins, DPM  megestrol (MEGACE) 40 MG tablet Take 1 tablet (  40 mg total) by mouth 2 (two) times daily. Can increase to two tablets twice a day in the event of heavy bleeding Patient not taking: Reported on 07/23/2018 07/28/16   Chancy Milroy, MD  mupirocin ointment (BACTROBAN) 2 % Apply 1 application topically 2 (two) times daily. 08/27/18   Tasia Catchings, Amy V, PA-C  SUMAtriptan (IMITREX) 100 MG tablet Take 100 mg by mouth every 2 (two) hours as needed for migraine. May repeat in 2 hours if headache persists or recurs.    [provider]  triamcinolone cream (KENALOG) 0.5 % Apply 1 application topically 6 (six) times daily.  09/22/17    [provider]    Family History Family History  Problem Relation Age of Onset  . Heart disease Mother   . Heart disease Maternal Grandmother     Social History Social History   Tobacco Use  . Smoking status: Current Some Day Smoker    Packs/day: 0.50    Years: 3.00    Pack years: 1.50    Types: Cigarettes  . Smokeless tobacco: Never Used  Substance Use Topics  . Alcohol use: No    Alcohol/week: 0.0 standard drinks  . Drug use: Yes    Types: Marijuana     Allergies   Coconut oil, Tape, Benadryl [diphenhydramine], Promethazine, Ampicillin, and Latex   Review of Systems Review of Systems  Reason unable to perform ROS: See HPI as above.     Physical Exam Triage Vital Signs ED Triage Vitals  Enc Vitals Group     BP 08/27/18 1400 113/79     Pulse Rate 08/27/18 1400 80     Resp 08/27/18 1400 18     Temp 08/27/18 1400 98.9 F (37.2 C)     Temp Source 08/27/18 1400 Oral     SpO2 08/27/18 1400 98 %     Weight --      Height --      Head Circumference --      Peak Flow --      Pain Score 08/27/18 1401 8     Pain Loc --      Pain Edu? --      Excl. in [place]? --    No data found.  Updated Vital Signs BP 113/79 (BP Location: Right Arm)   Pulse 80   Temp 98.9 F (37.2 C) (Oral)   Resp 18   SpO2 98%   Visual Acuity Right Eye Distance:   Left Eye Distance:   Bilateral Distance:    Right Eye Near:   Left Eye Near:    Bilateral Near:     Physical Exam Constitutional:      General: She is not in acute distress.    Appearance: She is well-developed. She is not ill-appearing, toxic-appearing or diaphoretic.  HENT:     Head: Normocephalic and atraumatic.  Eyes:     Conjunctiva/sclera: Conjunctivae normal.     Pupils: Pupils are equal, round, and reactive to light.  Pulmonary:     Effort: Pulmonary effort is normal. No respiratory distress.  Genitourinary:   Neurological:     Mental Status: She is alert and oriented to person, place, and  time.      UC Treatments / Results  Labs (all labs ordered are listed, but only abnormal results are displayed) Labs Reviewed - No data to display  EKG None  Radiology No results found.  Procedures Incision and Drainage  Date/Time: 08/27/2018 3:22 PM Performed by: Ok Edwards,  PA-C Authorized by: Ok Edwards, PA-C   Consent:    Consent obtained:  Verbal   Consent given by:  Patient   Risks discussed:  Bleeding, incomplete drainage, pain, infection and damage to other organs   Alternatives discussed:  Alternative treatment and referral Location:    Type:  Abscess   Size:  1cm x 1cm   Location:  Anogenital   Anogenital location:  Vulva Pre-procedure details:    Skin preparation:  Hibiclens Anesthesia (see MAR for exact dosages):    Anesthesia method:  Local infiltration   Local anesthetic:  Lidocaine 2% w/o epi Procedure type:    Complexity:  Simple Procedure details:    Needle aspiration: no     Incision types:  Single straight   Incision depth:  Dermal   Scalpel blade:  11   Wound management:  Probed and deloculated   Drainage:  Purulent   Drainage amount:  Moderate   Wound treatment:  Wound left open   Packing materials:  None Post-procedure details:    Patient tolerance of procedure:  Tolerated well, no immediate complications   (including critical care time)  Medications Ordered in UC Medications  bacitracin 500 UNIT/GM ointment (has no administration in time range)    Initial Impression / Assessment and Plan / UC Course  I have reviewed the triage vital signs and the nursing notes.  Pertinent labs & imaging results that were available during my care of the patient were reviewed by me and considered in my medical decision making (see chart for details).    Patient tolerated procedure well. Start doxycycline for surrounding cellulitis. Wound care instructions given. Discussed possibility of HS given frequent abscess. Discussed Hibiclens use, switching to  cotton underwear. Discussed following up with dermatology if continues with frequent abscess. Return precautions given. Patient expresses understanding and agrees to plan.   Final Clinical Impressions(s) / UC Diagnoses   Final diagnoses:  Abscess    ED Prescriptions    Medication Sig Dispense Auth. Provider   doxycycline (VIBRAMYCIN) 100 MG capsule Take 1 capsule (100 mg total) by mouth 2 (two) times daily. 20 capsule Tasia Catchings, Amy V, PA-C   mupirocin ointment (BACTROBAN) 2 % Apply 1 application topically 2 (two) times daily. 22 g Yu, Amy V, PA-C   chlorhexidine (HIBICLENS) 4 % external liquid Apply topically daily as needed. 120 mL Tobin Chad, [place] 08/27/18 1524

## 2018-08-27 NOTE — ED Triage Notes (Signed)
Pt here for abscess to right groin area

## 2018-08-27 NOTE — Discharge Instructions (Addendum)
Start doxycycline as directed. Bactroban as needed.  You can remove current dressing in 24 hours. Keep wound clean and dry. You can clean gently with soap and water. Do not soak area in water. Monitor for spreading redness, increased warmth, increased swelling, fever, follow up for reevaluation needed.

## 2018-08-29 ENCOUNTER — Other Ambulatory Visit: Payer: Self-pay

## 2018-08-29 ENCOUNTER — Encounter (HOSPITAL_COMMUNITY): Payer: Self-pay | Admitting: *Deleted

## 2018-08-29 NOTE — Anesthesia Preprocedure Evaluation (Addendum)
Anesthesia Evaluation  Patient identified by MRN, date of birth, ID band Patient awake    Reviewed: Allergy & Precautions, NPO status , Patient's Chart, lab work & pertinent test results  Airway Mallampati: II  TM Distance: >3 FB Neck ROM: Full    Dental no notable dental hx.    Pulmonary asthma , Current Smoker,    Pulmonary exam normal breath sounds clear to auscultation       Cardiovascular negative cardio ROS Normal cardiovascular exam Rhythm:Regular Rate:Normal     Neuro/Psych  Headaches, PSYCHIATRIC DISORDERS Anxiety Depression Bipolar Disorder    GI/Hepatic negative GI ROS, Neg liver ROS,   Endo/Other  negative endocrine ROS  Renal/GU negative Renal ROS     Musculoskeletal negative musculoskeletal ROS (+)   Abdominal   Peds  Hematology negative hematology ROS (+)   Anesthesia Other Findings DUB  Reproductive/Obstetrics S/p BTL                           Anesthesia Physical Anesthesia Plan  ASA: II  Anesthesia Plan: General   Post-op Pain Management:    Induction: Intravenous  PONV Risk Score and Plan: 3 and Ondansetron, Dexamethasone, Midazolam, Scopolamine patch - Pre-op and Treatment may vary due to age or medical condition  Airway Management Planned: Oral ETT  Additional Equipment:   Intra-op Plan:   Post-operative Plan: Extubation in OR  Informed Consent: I have reviewed the patients History and Physical, chart, labs and discussed the procedure including the risks, benefits and alternatives for the proposed anesthesia with the patient or authorized representative who has indicated his/her understanding and acceptance.     Dental advisory given  Plan Discussed with: CRNA  Anesthesia Plan Comments:        Anesthesia Quick Evaluation

## 2018-08-29 NOTE — Progress Notes (Addendum)
Patient informed of the [place] that is currently in effect.  Patient verbalized understanding.  Patient denies shortness of breath, fever, cough and chest pain.  PCP - Nonda Lou, NP at [facility] - Denies  Chest x-ray - Denies EKG - Denies Stress Test - Denies ECHO - Denies Cardiac Cath - Denies  ERAS:Clears til 4:15 am DOS. No drink.  Anesthesia review: No  STOP no taking any Aspirin (unless otherwise instructed by your surgeon), Aleve, Naproxen, Ibuprofen, Motrin, Advil, Goody's, BC's, all herbal medications, fish oil, and all vitamins.  Coronavirus Screening Have you or your partner experienced the following symptoms:  Cough yes/no: No Fever (>100.64F)  yes/no: No Runny nose yes/no: No Sore throat yes/no: No Difficulty breathing/shortness of breath  yes/no: No  Have you or your partner traveled in the last 14 days and where? yes/no: No

## 2018-08-30 ENCOUNTER — Encounter (HOSPITAL_COMMUNITY): Payer: Self-pay

## 2018-08-30 ENCOUNTER — Encounter (HOSPITAL_COMMUNITY)
Admission: RE | Disposition: A | Discharge status: Home / Self Care | Payer: Self-pay | Source: Home / Self Care | Attending: Obstetrics and Gynecology

## 2018-08-30 ENCOUNTER — Observation Stay (HOSPITAL_COMMUNITY)
Admission: RE | Admit: 2018-08-30 | Discharge: 2018-08-31 | Disposition: A | Discharge status: Home / Self Care | Payer: Medicaid Other | Attending: Obstetrics and Gynecology | Admitting: Obstetrics and Gynecology

## 2018-08-30 ENCOUNTER — Observation Stay (HOSPITAL_COMMUNITY): Payer: Medicaid Other | Admitting: Certified Registered"

## 2018-08-30 ENCOUNTER — Other Ambulatory Visit: Payer: Self-pay

## 2018-08-30 DIAGNOSIS — F419 Anxiety disorder, unspecified: Secondary | ICD-10-CM | POA: Diagnosis not present

## 2018-08-30 DIAGNOSIS — Z91040 Latex allergy status: Secondary | ICD-10-CM | POA: Insufficient documentation

## 2018-08-30 DIAGNOSIS — Z91018 Allergy to other foods: Secondary | ICD-10-CM | POA: Diagnosis not present

## 2018-08-30 DIAGNOSIS — N938 Other specified abnormal uterine and vaginal bleeding: Secondary | ICD-10-CM | POA: Diagnosis not present

## 2018-08-30 DIAGNOSIS — F319 Bipolar disorder, unspecified: Secondary | ICD-10-CM | POA: Diagnosis not present

## 2018-08-30 DIAGNOSIS — D259 Leiomyoma of uterus, unspecified: Secondary | ICD-10-CM | POA: Insufficient documentation

## 2018-08-30 DIAGNOSIS — Z91048 Other nonmedicinal substance allergy status: Secondary | ICD-10-CM | POA: Diagnosis not present

## 2018-08-30 DIAGNOSIS — G4700 Insomnia, unspecified: Secondary | ICD-10-CM | POA: Insufficient documentation

## 2018-08-30 DIAGNOSIS — Z8249 Family history of ischemic heart disease and other diseases of the circulatory system: Secondary | ICD-10-CM | POA: Diagnosis not present

## 2018-08-30 DIAGNOSIS — G43909 Migraine, unspecified, not intractable, without status migrainosus: Secondary | ICD-10-CM | POA: Insufficient documentation

## 2018-08-30 DIAGNOSIS — Z9889 Other specified postprocedural states: Secondary | ICD-10-CM

## 2018-08-30 DIAGNOSIS — F17210 Nicotine dependence, cigarettes, uncomplicated: Secondary | ICD-10-CM | POA: Insufficient documentation

## 2018-08-30 DIAGNOSIS — J45909 Unspecified asthma, uncomplicated: Secondary | ICD-10-CM | POA: Diagnosis not present

## 2018-08-30 DIAGNOSIS — Z79899 Other long term (current) drug therapy: Secondary | ICD-10-CM | POA: Diagnosis not present

## 2018-08-30 DIAGNOSIS — Z881 Allergy status to other antibiotic agents status: Secondary | ICD-10-CM | POA: Diagnosis not present

## 2018-08-30 DIAGNOSIS — Z880 Allergy status to penicillin: Secondary | ICD-10-CM | POA: Insufficient documentation

## 2018-08-30 DIAGNOSIS — Z9049 Acquired absence of other specified parts of digestive tract: Secondary | ICD-10-CM | POA: Diagnosis not present

## 2018-08-30 DIAGNOSIS — N920 Excessive and frequent menstruation with regular cycle: Secondary | ICD-10-CM | POA: Diagnosis present

## 2018-08-30 DIAGNOSIS — Z888 Allergy status to other drugs, medicaments and biological substances status: Secondary | ICD-10-CM | POA: Insufficient documentation

## 2018-08-30 HISTORY — PX: VAGINAL HYSTERECTOMY: SHX2639

## 2018-08-30 HISTORY — DX: Panic disorder (episodic paroxysmal anxiety): F41.0

## 2018-08-30 LAB — CBC
HCT: 42.9 % (ref 36.0–46.0)
Hemoglobin: 14.5 g/dL (ref 12.0–15.0)
MCH: 32.2 pg (ref 26.0–34.0)
MCHC: 33.8 g/dL (ref 30.0–36.0)
MCV: 95.1 fL (ref 80.0–100.0)
Platelets: 262 10*3/uL (ref 150–400)
RBC: 4.51 MIL/uL (ref 3.87–5.11)
RDW: 12.6 % (ref 11.5–15.5)
WBC: 11.8 10*3/uL — ABNORMAL HIGH (ref 4.0–10.5)
nRBC: 0 % (ref 0.0–0.2)

## 2018-08-30 LAB — BASIC METABOLIC PANEL
Anion gap: 10 (ref 5–15)
BUN: 6 mg/dL (ref 6–20)
CO2: 18 mmol/L — ABNORMAL LOW (ref 22–32)
Calcium: 8.9 mg/dL (ref 8.9–10.3)
Chloride: 108 mmol/L (ref 98–111)
Creatinine, Ser: 0.84 mg/dL (ref 0.44–1.00)
GFR calc Af Amer: >60 mL/min (ref >60)
GFR calc non Af Amer: >60 mL/min (ref >60)
Glucose, Bld: 86 mg/dL (ref 70–99)
Potassium: 3.3 mmol/L — ABNORMAL LOW (ref 3.5–5.1)
Sodium: 136 mmol/L (ref 135–145)

## 2018-08-30 LAB — ABO/RH: ABO/RH(D): AB POS

## 2018-08-30 LAB — POCT PREGNANCY, URINE: Preg Test, Ur: NEGATIVE

## 2018-08-30 LAB — TYPE AND SCREEN
ABO/RH(D): AB POS
Antibody Screen: NEGATIVE

## 2018-08-30 SURGERY — HYSTERECTOMY, VAGINAL
Anesthesia: General | Site: Vagina | Laterality: Bilateral

## 2018-08-30 MED ORDER — EPHEDRINE 5 MG/ML INJ
INTRAVENOUS | Status: AC
  Filled 2018-08-30: qty 10

## 2018-08-30 MED ORDER — GABAPENTIN 300 MG PO CAPS
ORAL_CAPSULE | ORAL | Status: AC
  Administered 2018-08-30: 07:00:00 300 mg via ORAL
  Filled 2018-08-30: qty 1

## 2018-08-30 MED ORDER — MIDAZOLAM HCL 5 MG/5ML IJ SOLN
INTRAMUSCULAR | Status: DC | PRN
  Administered 2018-08-30: 2 mg via INTRAVENOUS

## 2018-08-30 MED ORDER — ACETAMINOPHEN 500 MG PO TABS
1000.0000 mg | ORAL_TABLET | Freq: Once | ORAL | Status: AC
  Administered 2018-08-30: 07:00:00 1000 mg via ORAL

## 2018-08-30 MED ORDER — ROCURONIUM BROMIDE 10 MG/ML (PF) SYRINGE
PREFILLED_SYRINGE | INTRAVENOUS | Status: DC | PRN
  Administered 2018-08-30: 40 mg via INTRAVENOUS

## 2018-08-30 MED ORDER — SUGAMMADEX SODIUM 200 MG/2ML IV SOLN
INTRAVENOUS | Status: DC | PRN
  Administered 2018-08-30: 200 mg via INTRAVENOUS

## 2018-08-30 MED ORDER — CIPROFLOXACIN IN D5W 400 MG/200ML IV SOLN
INTRAVENOUS | Status: AC
  Filled 2018-08-30: qty 200

## 2018-08-30 MED ORDER — PROPOFOL 10 MG/ML IV BOLUS
INTRAVENOUS | Status: DC | PRN
  Administered 2018-08-30: 200 mg via INTRAVENOUS

## 2018-08-30 MED ORDER — MIDAZOLAM HCL 2 MG/2ML IJ SOLN
INTRAMUSCULAR | Status: AC
  Filled 2018-08-30: qty 2

## 2018-08-30 MED ORDER — ROCURONIUM BROMIDE 10 MG/ML (PF) SYRINGE
PREFILLED_SYRINGE | INTRAVENOUS | Status: AC
  Filled 2018-08-30: qty 10

## 2018-08-30 MED ORDER — HYDROMORPHONE HCL 1 MG/ML IJ SOLN
INTRAMUSCULAR | Status: AC
  Filled 2018-08-30: qty 1

## 2018-08-30 MED ORDER — SODIUM CHLORIDE 0.9 % IR SOLN
Status: DC | PRN
  Administered 2018-08-30: 1000 mL

## 2018-08-30 MED ORDER — HYDROMORPHONE HCL 1 MG/ML IJ SOLN
0.2500 mg | INTRAMUSCULAR | Status: DC | PRN
  Administered 2018-08-30 (×2): 0.5 mg via INTRAVENOUS

## 2018-08-30 MED ORDER — ALBUTEROL SULFATE HFA 108 (90 BASE) MCG/ACT IN AERS: 2.0000 | INHALATION_SPRAY | Freq: Four times a day (QID) | RESPIRATORY_TRACT | Status: DC | PRN

## 2018-08-30 MED ORDER — SIMETHICONE 80 MG PO CHEW: 80.0000 mg | CHEWABLE_TABLET | Freq: Four times a day (QID) | ORAL | Status: DC | PRN

## 2018-08-30 MED ORDER — PROMETHAZINE HCL 25 MG/ML IJ SOLN: 6.2500 mg | INTRAMUSCULAR | Status: DC | PRN

## 2018-08-30 MED ORDER — ONDANSETRON HCL 4 MG/2ML IJ SOLN
4.0000 mg | Freq: Four times a day (QID) | INTRAMUSCULAR | Status: DC | PRN
  Administered 2018-08-30: 4 mg via INTRAVENOUS
  Filled 2018-08-30 (×2): qty 2

## 2018-08-30 MED ORDER — SOD CITRATE-CITRIC ACID 500-334 MG/5ML PO SOLN
30.0000 mL | ORAL | Status: AC
  Administered 2018-08-30: 07:00:00 30 mL via ORAL

## 2018-08-30 MED ORDER — KETOROLAC TROMETHAMINE 30 MG/ML IJ SOLN
30.0000 mg | Freq: Four times a day (QID) | INTRAMUSCULAR | Status: AC
  Administered 2018-08-30 – 2018-08-31 (×4): 30 mg via INTRAVENOUS
  Filled 2018-08-30 (×4): qty 1

## 2018-08-30 MED ORDER — ONDANSETRON HCL 4 MG PO TABS: 4.0000 mg | ORAL_TABLET | Freq: Four times a day (QID) | ORAL | Status: DC | PRN

## 2018-08-30 MED ORDER — KETOROLAC TROMETHAMINE 15 MG/ML IJ SOLN
15.0000 mg | INTRAMUSCULAR | Status: AC
  Administered 2018-08-30: 07:00:00 15 mg via INTRAVENOUS
  Filled 2018-08-30: qty 1

## 2018-08-30 MED ORDER — CIPROFLOXACIN IN D5W 400 MG/200ML IV SOLN
400.0000 mg | INTRAVENOUS | Status: AC
  Administered 2018-08-30: 400 mg via INTRAVENOUS

## 2018-08-30 MED ORDER — DEXAMETHASONE SODIUM PHOSPHATE 10 MG/ML IJ SOLN
INTRAMUSCULAR | Status: DC | PRN
  Administered 2018-08-30: 5 mg via INTRAVENOUS

## 2018-08-30 MED ORDER — SCOPOLAMINE 1 MG/3DAYS TD PT72
1.0000 | MEDICATED_PATCH | TRANSDERMAL | Status: DC
  Administered 2018-08-30: 1.5 mg via TRANSDERMAL
  Filled 2018-08-30: qty 1

## 2018-08-30 MED ORDER — ESMOLOL HCL 100 MG/10ML IV SOLN
INTRAVENOUS | Status: AC
  Filled 2018-08-30: qty 10

## 2018-08-30 MED ORDER — FENTANYL CITRATE (PF) 250 MCG/5ML IJ SOLN
INTRAMUSCULAR | Status: DC | PRN
  Administered 2018-08-30 (×2): 50 ug via INTRAVENOUS
  Administered 2018-08-30: 100 ug via INTRAVENOUS
  Administered 2018-08-30: 50 ug via INTRAVENOUS

## 2018-08-30 MED ORDER — IBUPROFEN 800 MG PO TABS: 800.0000 mg | ORAL_TABLET | Freq: Three times a day (TID) | ORAL | Status: DC

## 2018-08-30 MED ORDER — OXYCODONE HCL 5 MG PO TABS
ORAL_TABLET | ORAL | Status: AC
  Filled 2018-08-30: qty 1

## 2018-08-30 MED ORDER — ACETAMINOPHEN 500 MG PO TABS
ORAL_TABLET | ORAL | Status: AC
  Administered 2018-08-30: 1000 mg via ORAL
  Filled 2018-08-30: qty 2

## 2018-08-30 MED ORDER — SUCCINYLCHOLINE CHLORIDE 200 MG/10ML IV SOSY
PREFILLED_SYRINGE | INTRAVENOUS | Status: AC
  Filled 2018-08-30: qty 10

## 2018-08-30 MED ORDER — DEXAMETHASONE SODIUM PHOSPHATE 10 MG/ML IJ SOLN
INTRAMUSCULAR | Status: AC
  Filled 2018-08-30: qty 1

## 2018-08-30 MED ORDER — OXYCODONE HCL 5 MG/5ML PO SOLN: 5.0000 mg | Freq: Once | ORAL | Status: AC | PRN

## 2018-08-30 MED ORDER — HYDROMORPHONE HCL 1 MG/ML IJ SOLN
1.0000 mg | INTRAMUSCULAR | Status: DC | PRN
  Administered 2018-08-30 – 2018-08-31 (×2): 1 mg via INTRAVENOUS
  Filled 2018-08-30 (×2): qty 1

## 2018-08-30 MED ORDER — LACTATED RINGERS IV SOLN
INTRAVENOUS | Status: DC
  Administered 2018-08-30: 07:00:00 via INTRAVENOUS

## 2018-08-30 MED ORDER — OXYCODONE HCL 5 MG PO TABS
5.0000 mg | ORAL_TABLET | Freq: Once | ORAL | Status: AC | PRN
  Administered 2018-08-30: 10:00:00 5 mg via ORAL

## 2018-08-30 MED ORDER — GABAPENTIN 300 MG PO CAPS
300.0000 mg | ORAL_CAPSULE | ORAL | Status: AC
  Administered 2018-08-30: 07:00:00 300 mg via ORAL

## 2018-08-30 MED ORDER — PROPOFOL 10 MG/ML IV BOLUS
INTRAVENOUS | Status: AC
  Filled 2018-08-30: qty 20

## 2018-08-30 MED ORDER — METRONIDAZOLE IN NACL 5-0.79 MG/ML-% IV SOLN
500.0000 mg | Freq: Once | INTRAVENOUS | Status: DC
  Filled 2018-08-30: qty 100

## 2018-08-30 MED ORDER — SOD CITRATE-CITRIC ACID 500-334 MG/5ML PO SOLN
ORAL | Status: AC
  Filled 2018-08-30: qty 15

## 2018-08-30 MED ORDER — FENTANYL CITRATE (PF) 250 MCG/5ML IJ SOLN
INTRAMUSCULAR | Status: AC
  Filled 2018-08-30: qty 5

## 2018-08-30 MED ORDER — ZOLPIDEM TARTRATE 5 MG PO TABS: 5.0000 mg | ORAL_TABLET | Freq: Every evening | ORAL | Status: DC | PRN

## 2018-08-30 MED ORDER — METRONIDAZOLE IN NACL 5-0.79 MG/ML-% IV SOLN
500.0000 mg | INTRAVENOUS | Status: DC
  Filled 2018-08-30 (×2): qty 100

## 2018-08-30 MED ORDER — ONDANSETRON HCL 4 MG/2ML IJ SOLN
INTRAMUSCULAR | Status: DC | PRN
  Administered 2018-08-30: 4 mg via INTRAVENOUS

## 2018-08-30 MED ORDER — PHENYLEPHRINE 40 MCG/ML (10ML) SYRINGE FOR IV PUSH (FOR BLOOD PRESSURE SUPPORT)
PREFILLED_SYRINGE | INTRAVENOUS | Status: AC
  Filled 2018-08-30: qty 10

## 2018-08-30 MED ORDER — ALBUTEROL SULFATE (2.5 MG/3ML) 0.083% IN NEBU: 2.5000 mg | INHALATION_SOLUTION | Freq: Four times a day (QID) | RESPIRATORY_TRACT | Status: DC | PRN

## 2018-08-30 MED ORDER — LIDOCAINE 2% (20 MG/ML) 5 ML SYRINGE
INTRAMUSCULAR | Status: AC
  Filled 2018-08-30: qty 5

## 2018-08-30 MED ORDER — LIDOCAINE 2% (20 MG/ML) 5 ML SYRINGE
INTRAMUSCULAR | Status: DC | PRN
  Administered 2018-08-30: 60 mg via INTRAVENOUS

## 2018-08-30 MED ORDER — OXYCODONE-ACETAMINOPHEN 5-325 MG PO TABS
1.0000 | ORAL_TABLET | ORAL | Status: DC | PRN
  Administered 2018-08-30: 2 via ORAL
  Filled 2018-08-30: qty 2

## 2018-08-30 MED ORDER — LACTATED RINGERS IV SOLN
INTRAVENOUS | Status: DC
  Administered 2018-08-30 – 2018-08-31 (×2): via INTRAVENOUS

## 2018-08-30 MED ORDER — ESMOLOL HCL 100 MG/10ML IV SOLN
INTRAVENOUS | Status: DC | PRN
  Administered 2018-08-30 (×2): 10 mg via INTRAVENOUS

## 2018-08-30 SURGICAL SUPPLY — 26 items
BLADE SURG 10 STRL SS (BLADE) ×3 IMPLANT
CANISTER SUCT 3000ML PPV (MISCELLANEOUS) ×3 IMPLANT
COVER WAND RF STERILE (DRAPES) ×3 IMPLANT
DECANTER SPIKE VIAL GLASS SM (MISCELLANEOUS) IMPLANT
GLOVE BIO SURGEON STRL SZ7.5 (GLOVE) ×3 IMPLANT
GLOVE BIO SURGEON STRL SZ8 (GLOVE) ×3 IMPLANT
GLOVE BIOGEL PI IND STRL 6.5 (GLOVE) ×2 IMPLANT
GLOVE BIOGEL PI IND STRL 7.0 (GLOVE) ×4 IMPLANT
GLOVE BIOGEL PI INDICATOR 6.5 (GLOVE) ×1
GLOVE BIOGEL PI INDICATOR 7.0 (GLOVE) ×2
GOWN STRL REUS W/ TWL LRG LVL3 (GOWN DISPOSABLE) ×6 IMPLANT
GOWN STRL REUS W/ TWL XL LVL3 (GOWN DISPOSABLE) ×2 IMPLANT
GOWN STRL REUS W/TWL LRG LVL3 (GOWN DISPOSABLE) ×3
GOWN STRL REUS W/TWL XL LVL3 (GOWN DISPOSABLE) ×1
KIT TURNOVER KIT B (KITS) ×3 IMPLANT
NS IRRIG 1000ML POUR BTL (IV SOLUTION) ×3 IMPLANT
PACK VAGINAL WOMENS (CUSTOM PROCEDURE TRAY) ×3 IMPLANT
PAD OB MATERNITY 4.3X12.25 (PERSONAL CARE ITEMS) ×3 IMPLANT
SPECIMEN JAR MEDIUM (MISCELLANEOUS) IMPLANT
SUT VIC AB 2-0 CT1 18 (SUTURE) ×3 IMPLANT
SUT VIC AB 2-0 CT1 27 (SUTURE)
SUT VIC AB 2-0 CT1 TAPERPNT 27 (SUTURE) IMPLANT
SUT VIC AB PLUS 45CM 1-MO-4 (SUTURE) ×6 IMPLANT
SUT VICRYL 1 TIES 12X18 (SUTURE) ×3 IMPLANT
TOWEL GREEN STERILE FF (TOWEL DISPOSABLE) ×6 IMPLANT
TRAY FOLEY W/BAG SLVR 14FR (SET/KITS/TRAYS/PACK) ×3 IMPLANT

## 2018-08-30 NOTE — Anesthesia Procedure Notes (Signed)
Procedure Name: Intubation Date/Time: 08/30/2018 7:27 AM Performed by: Myna Bright, CRNA Pre-anesthesia Checklist: Patient identified, Emergency Drugs available, Patient being monitored and Suction available Patient Re-evaluated:Patient Re-evaluated prior to induction Oxygen Delivery Method: Circle system utilized Preoxygenation: Pre-oxygenation with 100% oxygen Induction Type: IV induction Ventilation: Mask ventilation without difficulty Laryngoscope Size: Mac and 3 Grade View: Grade I Tube type: Oral Tube size: 7.0 mm Number of attempts: 1 Airway Equipment and Method: Stylet Placement Confirmation: ETT inserted through vocal cords under direct vision,  positive ETCO2 and breath sounds checked- equal and bilateral Secured at: 21 cm Tube secured with: Tape Dental Injury: Teeth and Oropharynx as per pre-operative assessment

## 2018-08-30 NOTE — Interval H&P Note (Signed)
History and Physical Interval Note:  08/30/2018 7:17 AM  Diane Garrison  has presented today for surgery, with the diagnosis of DUB.  The various methods of treatment have been discussed with the patient and family. After consideration of risks, benefits and other options for treatment, the patient has consented to  Procedure(s): HYSTERECTOMY VAGINAL WITH SALPINGECTOMY (Bilateral) as a surgical intervention.  The patient's history has been reviewed, patient examined, no change in status, stable for surgery.  I have reviewed the patient's chart and labs.  Questions were answered to the patient's satisfaction.     Chancy Milroy

## 2018-08-30 NOTE — Op Note (Signed)
Faythe Casa PROCEDURE DATE: 08/30/2018  PREOPERATIVE DIAGNOSIS:  Symptomatic fibroids, menorrhagia POSTOPERATIVE DIAGNOSIS:  Symptomatic fibroids, menorrhagia SURGEON:   Chancy Milroy M.D., FACOG ASSISTANT: Orpah Greek, M.D. OPERATION:  Total Vaginal hysterectomy with bilateral salpingectomy ANESTHESIA:  General endotracheal.  INDICATIONS: The patient is a 33 y.o. No obstetric history on file. with history of symptomatic uterine fibroids/menorrhagia. The patient made a decision to undergo definite surgical treatment. On the preoperative visit, the risks, benefits, indications, and alternatives of the procedure were reviewed with the patient.  On the day of surgery, the risks of surgery were again discussed with the patient including but not limited to: bleeding which may require transfusion or reoperation; infection which may require antibiotics; injury to bowel, bladder, ureters or other surrounding organs; need for additional procedures; thromboembolic phenomenon, incisional problems and other postoperative/anesthesia complications. Written informed consent was obtained.    OPERATIVE FINDINGS: A 10 week size uterus with normal tubes and ovaries bilaterally.  ESTIMATED BLOOD LOSS: 50 ml FLUIDS:  As recorded URINE OUTPUT: As recorded SPECIMENS:  Uterus and cervix sent to pathology COMPLICATIONS:  None immediate.  DESCRIPTION OF PROCEDURE:  The patient received intravenous antibiotics and had sequential compression devices applied to her lower extremities while in the preoperative area.  She was then taken to the operating room where general anesthesia was administered and was found to be adequate.  She was placed in the dorsal lithotomy position, and was prepped and draped in a sterile manner. Timeout was performed. A foley catheter was placed and left indwelling.  A weighted speculum was then placed in the vagina, and the  posterior lip of the cervix was grasped  with  tenaculum.   The posterior cul-de-sac was entered sharply without difficulty.   A long weighted speculum was inserted into the posterior cul-de-sac. Attention was turned to the anterior cervix which was grasped with tenaculum. The anterior vaginal mucosa was then sharply incised. The bladder was then dissected off the pubocervical fascia without problems. The anterior was enter sharply. Verified by evidence of bowel. Deaver was replaced.  Zeplin clamps were then used to clamp the uterosacral ligaments on either side.  They were then cut and sutured ligated with 0 Vicryl. Of note, all sutures used in this case were 0 Vicryl unless otherwise noted.   The cardinal ligaments, uterine vessels and broad liagments. The final pedicle involving the uteroovarian was  were then clamped, cut and ligated with a free tie and then in a [place] fashion. The fallopian tubes were identified bilaterally, grasped with a Babcock clamp. The mesosalpinx was the clamped, cut and ligated. The specimens were then passed off for pathology. All pedicles were reviewed.   Excellent hemostasis was noted.  The peritoneum was the closed in a purse string fashion.  The vaginal cuff was  reapproximated using 2/0 Vicryl. All instruments were then removed from the pelvis.  The patient tolerated the procedure well.  All instruments, needles, and sponge counts were correct x 2. The patient was taken to the recovery room in stable condition.   An experienced assistant was required given the standard of surgical care given the complexity of the case.  This assistant was needed for exposure, dissection, suctioning, retraction,  and for overall help during the procedure.

## 2018-08-30 NOTE — Transfer of Care (Signed)
Immediate Anesthesia Transfer of Care Note  Patient: Diane Garrison  Procedure(s) Performed: HYSTERECTOMY VAGINAL WITH SALPINGECTOMY (Bilateral Vagina )  Patient Location: PACU  Anesthesia Type:General  Level of Consciousness: sedated, patient cooperative and responds to stimulation  Airway & Oxygen Therapy: Patient Spontanous Breathing and Patient connected to nasal cannula oxygen  Post-op Assessment: Report given to RN, Post -op Vital signs reviewed and stable and Patient moving all extremities  Post vital signs: Reviewed and stable  Last Vitals:  Vitals Value Taken Time  BP 125/72 08/30/18 0856  Temp    Pulse 87 08/30/18 0856  Resp 15 08/30/18 0856  SpO2 99 % 08/30/18 0856  Vitals shown include unvalidated device data.  Last Pain:  Vitals:   08/30/18 0625  PainSc: 0-No pain         Complications: No apparent anesthesia complications

## 2018-08-30 NOTE — Anesthesia Postprocedure Evaluation (Signed)
Anesthesia Post Note  Patient: Jamilex Bohnsack  Procedure(s) Performed: HYSTERECTOMY VAGINAL WITH SALPINGECTOMY (Bilateral Vagina )     Patient location during evaluation: PACU Anesthesia Type: General Level of consciousness: awake and alert Pain management: pain level controlled Vital Signs Assessment: post-procedure vital signs reviewed and stable Respiratory status: spontaneous breathing, nonlabored ventilation, respiratory function stable and patient connected to nasal cannula oxygen Cardiovascular status: blood pressure returned to baseline and stable Postop Assessment: no apparent nausea or vomiting Anesthetic complications: no    Last Vitals:  Vitals:   08/30/18 1054 08/30/18 1200  BP: 124/69 122/84  Pulse: 68 62  Resp: 14 13  Temp: 36.8 C 36.8 C  SpO2: 100% 100%    Last Pain:  Vitals:   08/30/18 1209  TempSrc:   PainSc: 8                  Ryan P Ellender

## 2018-08-31 ENCOUNTER — Encounter (HOSPITAL_COMMUNITY): Payer: Self-pay | Admitting: Obstetrics and Gynecology

## 2018-08-31 DIAGNOSIS — N920 Excessive and frequent menstruation with regular cycle: Secondary | ICD-10-CM | POA: Diagnosis not present

## 2018-08-31 LAB — BASIC METABOLIC PANEL
Anion gap: 9 (ref 5–15)
BUN: <5 mg/dL — ABNORMAL LOW (ref 6–20)
CO2: 22 mmol/L (ref 22–32)
Calcium: 9.3 mg/dL (ref 8.9–10.3)
Chloride: 106 mmol/L (ref 98–111)
Creatinine, Ser: 0.69 mg/dL (ref 0.44–1.00)
GFR calc Af Amer: >60 mL/min (ref >60)
GFR calc non Af Amer: >60 mL/min (ref >60)
Glucose, Bld: 115 mg/dL — ABNORMAL HIGH (ref 70–99)
Potassium: 4 mmol/L (ref 3.5–5.1)
Sodium: 137 mmol/L (ref 135–145)

## 2018-08-31 LAB — CBC
HCT: 40 % (ref 36.0–46.0)
Hemoglobin: 13.4 g/dL (ref 12.0–15.0)
MCH: 31.2 pg (ref 26.0–34.0)
MCHC: 33.5 g/dL (ref 30.0–36.0)
MCV: 93 fL (ref 80.0–100.0)
Platelets: 249 10*3/uL (ref 150–400)
RBC: 4.3 MIL/uL (ref 3.87–5.11)
RDW: 12.3 % (ref 11.5–15.5)
WBC: 14.4 10*3/uL — ABNORMAL HIGH (ref 4.0–10.5)
nRBC: 0 % (ref 0.0–0.2)

## 2018-08-31 MED ORDER — HYDROMORPHONE HCL 2 MG PO TABS: 2.0000 mg | ORAL_TABLET | ORAL | 0 refills | Status: DC | PRN

## 2018-08-31 MED ORDER — IBUPROFEN 800 MG PO TABS: 800.0000 mg | ORAL_TABLET | Freq: Three times a day (TID) | ORAL | 0 refills | Status: DC

## 2018-08-31 NOTE — Progress Notes (Signed)
Discharged Pt to home. Alert, oriented, and stable. Instructions given and explained. Belongings returned accordingly.

## 2018-08-31 NOTE — Discharge Instructions (Signed)
Vaginal Hysterectomy, Care After Refer to this sheet in the next few weeks. These instructions provide you with information about caring for yourself after your procedure. Your health care provider may also give you more specific instructions. Your treatment has been planned according to current medical practices, but problems sometimes occur. Call your health care provider if you have any problems or questions after your procedure. What can I expect after the procedure? After the procedure, it is common to have:  Pain.  Soreness and numbness in your incision areas.  Vaginal bleeding and discharge.  Constipation.  Temporary problems emptying the bladder.  Feelings of sadness or other emotions. Follow these instructions at home: Medicines  Take over-the-counter and prescription medicines only as told by your health care provider.  If you were prescribed an antibiotic medicine, take it as told by your health care provider. Do not stop taking the antibiotic even if you start to feel better.  Do not drive or operate heavy machinery while taking prescription pain medicine. Activity  Return to your normal activities as told by your health care provider. Ask your health care provider what activities are safe for you.  Get regular exercise as told by your health care provider. You may be told to take short walks every day and go farther each time.  Do not lift anything that is heavier than 10 lb (4.5 kg). General instructions   Do not put anything in your vagina for 6 weeks after your surgery or as told by your health care provider. This includes tampons and douches.  Do not have sex until your health care provider says you can.  Do not take baths, swim, or use a hot tub until your health care provider approves.  Drink enough fluid to keep your urine clear or pale yellow.  Do not drive for 24 hours if you were given a sedative.  Keep all follow-up visits as told by your health  care provider. This is important. Contact a health care provider if:  Your pain medicine is not helping.  You have a fever.  You have redness, swelling, or pain at your incision site.  You have blood, pus, or a bad-smelling discharge from your vagina.  You continue to have difficulty urinating. Get help right away if:  You have severe abdominal or back pain.  You have heavy bleeding from your vagina.  You have chest pain or shortness of breath. This information is not intended to replace advice given to you by your health care provider. Make sure you discuss any questions you have with your health care provider. Document Released: 06/10/2015 Document Revised: 10/10/2015 Document Reviewed: 03/03/2015 Elsevier Patient Education  2020 [employer].

## 2018-08-31 NOTE — Discharge Summary (Signed)
Physician Discharge Summary  Patient ID: Diane Garrison MRN: [id number] DOB/AGE: 1985-10-05 33 y.o.  Admit date: 08/30/2018 Discharge date: 08/31/2018  Admission Diagnoses:Uterine fibroids and menorraghia  Discharge Diagnoses:  Active Problems:   Post-operative state   Discharged Condition: good  Hospital Course: Diane Garrison was admitted with above Dx and underwent TVH/BS without problems. See OP note for additional information. Post op course was unremarkable. Progressed to ambulating, voiding, tolerating diet and good oral pain control. Felt amendable for discharge home on POD # 1. Discharge medications, instructions and follow up reviewed with pt. Pt verbalized understanding.   Consults: None  Significant Diagnostic Studies: labs:   Treatments: surgery: TVH/BS  Discharge Exam: Blood pressure (!) 96/52, pulse (!) 52, temperature 98.6 F (37 C), temperature source Oral, resp. rate 16, height 5\' 1"  (1.549 m), weight 71.2 kg, SpO2 97 %. Lungs clear Heart RRR Abd soft + BS GU no bleeding Ext nontender  Disposition: Discharge disposition: 01-Home or Self Care       Discharge Instructions    Call MD for:  difficulty breathing, headache or visual disturbances   Complete by: As directed    Call MD for:  extreme fatigue   Complete by: As directed    Call MD for:  hives   Complete by: As directed    Call MD for:  persistant dizziness or light-headedness   Complete by: As directed    Call MD for:  persistant nausea and vomiting   Complete by: As directed    Call MD for:  redness, tenderness, or signs of infection (pain, swelling, redness, odor or green/yellow discharge around incision site)   Complete by: As directed    Call MD for:  severe uncontrolled pain   Complete by: As directed    Call MD for:  temperature >100.4   Complete by: As directed    Diet - low sodium heart healthy   Complete by: As directed    Increase activity slowly   Complete by: As directed    Sexual Activity Restrictions   Complete by: As directed    Pelvic rest x 6 weeks     Allergies as of 08/31/2018      Reactions   Coconut Oil Swelling   Tape Rash   Adhesive tape allergy.  Paper tape is okay.   Benadryl [diphenhydramine] Swelling   Tongue swelled   Promethazine Nausea And Vomiting   Ampicillin Rash   Did it involve swelling of the face/tongue/throat, SOB, or low BP? Unknown Did it involve sudden or severe rash/hives, skin peeling, or any reaction on the inside of your mouth or nose? Unknown Did you need to seek medical attention at a hospital or doctor's office? No When did it last happen?9 + year If all above answers are "NO", may proceed with cephalosporin use. Pt was given ampicillin in hospital, was asleep when she reacted to it.   Latex Rash      Medication List    STOP taking these medications   chlorhexidine 4 % external liquid Commonly known as: Hibiclens   doxycycline 100 MG capsule Commonly known as: VIBRAMYCIN   megestrol 40 MG tablet Commonly known as: MEGACE     TAKE these medications   albuterol 108 (90 Base) MCG/ACT inhaler Commonly known as: VENTOLIN HFA Inhale 2 puffs into the lungs every 6 (six) hours as needed for wheezing or shortness of breath. For shortness of breath.   bisacodyl 5 MG EC tablet Commonly known as: DULCOLAX  Take 15 mg by mouth daily as needed for moderate constipation.   cetirizine 10 MG tablet Commonly known as: ZyrTEC Allergy Take 1 tablet (10 mg total) by mouth daily. What changed:   when to take this  reasons to take this   fluticasone 50 MCG/ACT nasal spray Commonly known as: FLONASE Place 2 sprays into both nostrils daily. What changed:   when to take this  reasons to take this   HYDROmorphone 2 MG tablet Commonly known as: DILAUDID Take 1 tablet (2 mg total) by mouth every 4 (four) hours as needed for severe pain.   ibuprofen 800 MG tablet Commonly known as: ADVIL Take 1 tablet (800  mg total) by mouth 3 (three) times daily. What changed:   when to take this  reasons to take this   mupirocin ointment 2 % Commonly known as: Bactroban Apply 1 application topically 2 (two) times daily.   SUMAtriptan 100 MG tablet Commonly known as: IMITREX Take 100 mg by mouth every 2 (two) hours as needed for migraine. May repeat in 2 hours if headache persists or recurs.   triamcinolone cream 0.5 % Commonly known as: KENALOG Apply 1 application topically 6 (six) times daily.      Follow-up Information    [facility]. Schedule an appointment as soon as possible for a visit in 4 week(s).   Why: Post op appt with Dr. Gardiner Fanti  Contact information: [address] [place] [phone]          Signed: Chancy Garrison 08/31/2018, 10:19 AM

## 2018-08-31 NOTE — Plan of Care (Signed)
  Problem: Self-Concept: Goal: Communication of feelings regarding changes in body function or appearance will improve Outcome: Progressing   Problem: Skin Integrity: Goal: Demonstration of wound healing without infection will improve Outcome: Progressing

## 2018-09-06 ENCOUNTER — Other Ambulatory Visit: Payer: Self-pay

## 2018-09-06 ENCOUNTER — Encounter: Payer: Self-pay | Admitting: Obstetrics & Gynecology

## 2018-09-06 ENCOUNTER — Ambulatory Visit (INDEPENDENT_AMBULATORY_CARE_PROVIDER_SITE_OTHER): Payer: Medicaid Other | Admitting: Obstetrics & Gynecology

## 2018-09-06 VITALS — BP 107/80 | HR 80 | Temp 98.6°F

## 2018-09-06 DIAGNOSIS — N750 Cyst of Bartholin's gland: Secondary | ICD-10-CM

## 2018-09-06 DIAGNOSIS — Z9889 Other specified postprocedural states: Secondary | ICD-10-CM

## 2018-09-06 DIAGNOSIS — N9089 Other specified noninflammatory disorders of vulva and perineum: Secondary | ICD-10-CM

## 2018-09-06 NOTE — Progress Notes (Signed)
No obstetric history on file. 7days postop from [facility], with 2 days of pain and swelling the right introitus. She recently had abscesses drained from her thigh done in ED prior to her TVH. Exam is c/w 2.5 cm right Bartholin's gland abscess, she agrees to I&D    Bartholin Cyst I&D and Word Catheter Placement Enlarged abscess palpated in front of the hymenal ring around  7 o' clock.  Written informed consent was obtained.  Discussed complications and possible outcomes of procedure including recurrence of cyst, scarring leading to infection, bleeding, dyspareunia, distortion of anatomy.  Patient was examined in the dorsal lithotomy position and mass was identified.  The area was prepped with Iodine and draped in a sterile manner. 1% Lidocaine (3 ml) was then used to infiltrate area on top of the cyst, behind the hymenal ring.  A 7 mm incision was made using a sterile scapel. Upon palpation of the mass, a moderate amount of bloody  drainage was expressed through the incision. A hemostat was used to break up loculations, which resulted in expression of only a small amount of bloody drainage. Word catheter was placed. 3 ml of sterile water was used to inflate the catheter balloon.  The end of the catheter was tucked into the vagina.  Patient tolerated the procedure well. Possible postoperative vulvar abscess that appeared like an abscess - Recommended Sitz baths bid and Motrin and Percocet was given  prn pain.   She was told to call to be examined if she experiences increasing swelling, pain, vaginal discharge, or fever.  - She was instructed to wear a peripad to absorb discharge, and to maintain pelvic rest while the Word catheter is in place.  -The catheter will be left in place for at least two to four weeks to promote formation of an epithelialized tract for permanent drainage of glandular secretions.  - She [facility] now for removal of word catheter.Patient ID: Diane Garrison, female   DOB: 16-Aug-1985, 33 y.o.   MRN: [id number]

## 2018-09-06 NOTE — Progress Notes (Signed)
Pt presents for "knot" inside vagina area.  Pt's level 7/10 gets relief with IB and Dilaudid.

## 2018-09-08 ENCOUNTER — Encounter: Payer: Medicaid Other | Admitting: Family Medicine

## 2018-09-08 ENCOUNTER — Encounter (HOSPITAL_COMMUNITY): Payer: Self-pay | Admitting: Emergency Medicine

## 2018-09-08 ENCOUNTER — Emergency Department (HOSPITAL_COMMUNITY)
Admission: EM | Admit: 2018-09-08 | Discharge: 2018-09-08 | Disposition: A | Discharge status: Home / Self Care | Payer: Medicaid Other | Attending: Emergency Medicine | Admitting: Emergency Medicine

## 2018-09-08 ENCOUNTER — Telehealth: Payer: Self-pay

## 2018-09-08 ENCOUNTER — Other Ambulatory Visit: Payer: Self-pay

## 2018-09-08 DIAGNOSIS — N939 Abnormal uterine and vaginal bleeding, unspecified: Secondary | ICD-10-CM | POA: Diagnosis not present

## 2018-09-08 DIAGNOSIS — Z5321 Procedure and treatment not carried out due to patient leaving prior to being seen by health care provider: Secondary | ICD-10-CM | POA: Diagnosis not present

## 2018-09-08 NOTE — ED Triage Notes (Signed)
Pt had a hysterectomy 2 weeks ago. Had no vaginal bleeding for 1 week.  Pt began having pain to a lump on her right labia. Went to [facility] and had it lanced. Pt states the abscess has gotten larger and might be traveling back. States it is draining blood but not pus, changing her pad 1 every 5 hours, it is not saturated.

## 2018-09-08 NOTE — ED Notes (Signed)
No answer when called for room 

## 2018-09-08 NOTE — ED Notes (Signed)
Patient called to Room in 28; no answer x 3.

## 2018-09-08 NOTE — Telephone Encounter (Signed)
Patient needs letter for renewal of transportation card for employment stating that she will not be prescribed any hard  Narcotics for pain. Pt will need to submit letter on Monday pt was just notified yesterday afternoon that she would need this for renewal.

## 2018-09-09 ENCOUNTER — Other Ambulatory Visit: Payer: Self-pay

## 2018-09-09 ENCOUNTER — Emergency Department (HOSPITAL_COMMUNITY)
Admission: EM | Admit: 2018-09-09 | Discharge: 2018-09-10 | Disposition: A | Discharge status: Home / Self Care | Payer: Medicaid Other | Attending: Emergency Medicine | Admitting: Emergency Medicine

## 2018-09-09 ENCOUNTER — Encounter (HOSPITAL_COMMUNITY): Payer: Self-pay

## 2018-09-09 DIAGNOSIS — Z5321 Procedure and treatment not carried out due to patient leaving prior to being seen by health care provider: Secondary | ICD-10-CM | POA: Diagnosis not present

## 2018-09-09 DIAGNOSIS — H2513 Age-related nuclear cataract, bilateral: Secondary | ICD-10-CM | POA: Diagnosis not present

## 2018-09-09 DIAGNOSIS — L0291 Cutaneous abscess, unspecified: Secondary | ICD-10-CM | POA: Diagnosis not present

## 2018-09-09 DIAGNOSIS — H33321 Round hole, right eye: Secondary | ICD-10-CM | POA: Diagnosis not present

## 2018-09-09 DIAGNOSIS — H33191 Other retinoschisis and retinal cysts, right eye: Secondary | ICD-10-CM | POA: Diagnosis not present

## 2018-09-09 NOTE — ED Triage Notes (Signed)
Pt states she has a hysterectomy two weeks ago. On 7/4 noticed a bump, been seen for possible cyst or abscess, had it lanced on 7/7 its has been draining since and packing has fallen out. Now stating her pain is worsening and site is swelling, difficulty sitting and walking, pain medication is not helping.

## 2018-09-13 ENCOUNTER — Encounter: Payer: Medicaid Other | Admitting: Obstetrics & Gynecology

## 2018-09-14 ENCOUNTER — Ambulatory Visit (INDEPENDENT_AMBULATORY_CARE_PROVIDER_SITE_OTHER): Payer: Medicaid Other | Admitting: Obstetrics & Gynecology

## 2018-09-14 ENCOUNTER — Other Ambulatory Visit: Payer: Self-pay

## 2018-09-14 ENCOUNTER — Encounter: Payer: Self-pay | Admitting: Obstetrics & Gynecology

## 2018-09-14 VITALS — BP 118/77 | HR 86 | Wt 157.0 lb

## 2018-09-14 DIAGNOSIS — Z9889 Other specified postprocedural states: Secondary | ICD-10-CM

## 2018-09-14 NOTE — Progress Notes (Signed)
Pt is in the office to follow up after office visit on 09-06-18 with cyst I&D. Pt reports that pain is better today than it was at last visit. She asks if the catheter is to be removed today and we discussed keeping it to allow healing and decrease the chance of recurrence  Pelvic: Word catheter in place on right of posterior introitus with small amount of blood, no tenderness or swelling Imp: S/P drainage of perineal hematoma post op from [facility]  Plan: RTC 2 weeks with catheter in place  Woodroe Mode, MD 09/14/2018

## 2018-09-14 NOTE — Patient Instructions (Signed)
Bartholin's Cyst  A Bartholin's cyst is a fluid-filled sac that forms on a Bartholin's gland. Bartholin's glands are small glands in the folds of skin around the opening of the vagina (labia). This type of cyst causes a bulge or lump near the lower opening of the vagina. If you have a cyst that is small and not infected, you may be able to take care of it at home. If your cyst gets infected, it may cause pain and your doctor may need to drain it. An infected Bartholin's cyst is called a Bartholin's abscess. Follow these instructions at home: Medicines  Take over-the-counter and prescription medicines only as told by your doctor.  If you were prescribed an antibiotic medicine, take it as told by your doctor. Do not stop taking the antibiotic even if you start to feel better. Managing pain and swelling  Try sitz baths to help with pain and swelling. A sitz bath is a warm water bath in which the water only comes up to your hips and should cover your buttocks. You may take sitz baths a few times a day.  Put heat on the affected area as often as needed. Use the heat source that your doctor recommends, such as a moist heat pack or a heating pad. ? Place a towel between your skin and the heat source. ? Leave the heat on for 20-30 minutes. ? Remove the heat if your skin turns bright red. This is especially important if you cannot feel pain, heat, or cold. You may have a greater risk of getting burned. General instructions  If your cyst or abscess was drained: ? Follow instructions from your doctor about how to take care of your wound. ? Use feminine pads to absorb any fluid.  Do not push on or squeeze your cyst.  Do not have sex until the cyst has gone away or your wound from drainage has healed.  Take these steps to help prevent a Bartholin's cyst from returning, and to prevent other Bartholin's cysts from forming: ? Take a bath or shower once a day. Clean your vaginal area with mild soap and  water when you bathe. ? Practice safe sex to prevent STIs (sexually transmitted infections). Talk with your doctor about how to prevent STIs and which forms of birth control (contraception) may be best for you.  Keep all follow-up visits as told by your doctor. This is important. Contact a doctor if:  You have a fever.  You get redness, swelling, or pain around your cyst.  You have fluid, blood, pus, or a bad smell coming from your cyst.  You have a cyst that gets larger or comes back. Summary  A Bartholin's cyst is a fluid-filled sac that forms on a Bartholin's gland. These small glands are found in the folds of skin around the opening of the vagina (labia).  This type of cyst causes a bulge or lump near the lower opening of the vagina. An infected Bartholin's cyst is called a Bartholin's abscess.  Try sitz baths a few times a day to help with pain and swelling.  Do not push on or squeeze your cyst. This information is not intended to replace advice given to you by your health care provider. Make sure you discuss any questions you have with your health care provider. Document Released: 05/15/2008 Document Revised: 12/09/2017 Document Reviewed: 11/18/2016 Elsevier Patient Education  2020 Elsevier Inc.  

## 2018-09-15 ENCOUNTER — Encounter (HOSPITAL_COMMUNITY): Payer: Self-pay | Admitting: Emergency Medicine

## 2018-09-15 ENCOUNTER — Ambulatory Visit (HOSPITAL_COMMUNITY)
Admission: EM | Admit: 2018-09-15 | Discharge: 2018-09-15 | Disposition: A | Discharge status: Home / Self Care | Payer: Medicaid Other | Attending: Emergency Medicine | Admitting: Emergency Medicine

## 2018-09-15 ENCOUNTER — Other Ambulatory Visit: Payer: Self-pay

## 2018-09-15 DIAGNOSIS — N750 Cyst of Bartholin's gland: Secondary | ICD-10-CM | POA: Diagnosis not present

## 2018-09-15 MED ORDER — AMOXICILLIN-POT CLAVULANATE 875-125 MG PO TABS: 1.0000 | ORAL_TABLET | Freq: Two times a day (BID) | ORAL | 0 refills | Status: DC

## 2018-09-15 MED ORDER — DOXYCYCLINE HYCLATE 100 MG PO CAPS: 100.0000 mg | ORAL_CAPSULE | Freq: Two times a day (BID) | ORAL | 0 refills | Status: AC

## 2018-09-15 NOTE — ED Triage Notes (Signed)
Pt here for abscess to vaginal area x several weeks

## 2018-09-15 NOTE — Discharge Instructions (Signed)
Please follow up with OBGYN Begin antibiotic  Warm compresses  Use anti-inflammatories for pain/swelling. You may take up to 800 mg Ibuprofen every 8 hours with food. You may supplement Ibuprofen with Tylenol [phone] mg every 8 hours.

## 2018-09-15 NOTE — ED Provider Notes (Signed)
Diane Garrison    CSN: [id number] Arrival date & time: 09/15/18  1422      History   Chief Complaint Chief Complaint  Patient presents with  . Abscess    HPI Diane Garrison is a 33 y.o. female history of tobacco use, bipolar disorder, presenting today for evaluation of follow-up of Bartholin's abscess.  Patient had I&D of right Bartholin's gland on 7/7.  She had Word catheter placed.  Had follow-up with OB/GYN yesterday.  She is presenting today as she has had continued pain and discomfort.  She is requesting to have this drain removed.  She states that she has not been on antibiotics, states that there is never pus.  Of note patient also recently had hysterectomy on 6/30.  HPI  Past Medical History:  Diagnosis Date  . Allergy   . Anemia   . Anxiety   . Asthma   . Bipolar disorder ([place])    Bipolar 2  . Cigarette smoker   . Depression   . Insomnia   . Migraine   . Panic attacks     Patient Active Problem List   Diagnosis Date Noted  . Post-operative state 08/30/2018  . Abnormal auditory perception of both ears 10/15/2017  . Bilateral temporomandibular joint pain 10/15/2017  . Moderate persistent asthma without complication 29/51/8841  . Tobacco use 08/02/2014  . Allergic rhinitis 07/13/2012  . Headache, menstrual migraine 07/13/2012  . Vitamin D deficiency 01/05/2010  . Hypertrophy of breast 02/04/2009  . Mastodynia 02/04/2009  . Contact dermatitis and other eczema, due to unspecified cause 07/19/2008  . Herpes simplex virus (HSV) infection 07/19/2008    Past Surgical History:  Procedure Laterality Date  . CESAREAN SECTION  2004,2006,2007,2008   x 4  . CHOLECYSTECTOMY    . DILITATION & CURRETTAGE/HYSTROSCOPY WITH HYDROTHERMAL ABLATION N/A 03/16/2014   Procedure: DILATATION & CURETTAGE/HYSTEROSCOPY WITH HYDROTHERMAL ABLATION;  Surgeon: Shelly Bombard, MD;  Location: [facility];  Service: Gynecology;  Laterality: N/A;  . FOOT SURGERY  2018, 04/2018     right foot 2018, left foot 2020  . TUBAL LIGATION  03/20/2007  . VAGINAL HYSTERECTOMY Bilateral 08/30/2018   Procedure: HYSTERECTOMY VAGINAL WITH SALPINGECTOMY;  Surgeon: Chancy Milroy, MD;  Location: [facility];  Service: Gynecology;  Laterality: Bilateral;  . WISDOM TOOTH EXTRACTION      OB History    Gravida  4   Para  4   Term  4   Preterm      AB      Living  4     SAB      TAB      Ectopic      Multiple      Live Births  4            Home Medications    Prior to Admission medications   Medication Sig Start Date End Date Taking? Authorizing Provider  albuterol (PROVENTIL HFA;VENTOLIN HFA) 108 (90 BASE) MCG/ACT inhaler Inhale 2 puffs into the lungs every 6 (six) hours as needed for wheezing or shortness of breath. For shortness of breath.     [provider]  bisacodyl (DULCOLAX) 5 MG EC tablet Take 15 mg by mouth daily as needed for moderate constipation.    [provider]  cetirizine (ZYRTEC ALLERGY) 10 MG tablet Take 1 tablet (10 mg total) by mouth daily. Patient not taking: Reported on 09/06/2018 09/26/14   Waynetta Pean, PA-C  doxycycline (VIBRAMYCIN) 100 MG capsule Take  1 capsule (100 mg total) by mouth 2 (two) times daily for 10 days. 09/15/18 09/25/18  Thorin Starner C, PA-C  fluticasone (FLONASE) 50 MCG/ACT nasal spray Place 2 sprays into both nostrils daily. Patient not taking: Reported on 09/06/2018 09/26/14   Waynetta Pean, PA-C  HYDROmorphone (DILAUDID) 2 MG tablet Take 1 tablet (2 mg total) by mouth every 4 (four) hours as needed for severe pain. 08/31/18   Chancy Milroy, MD  ibuprofen (ADVIL) 800 MG tablet Take 1 tablet (800 mg total) by mouth 3 (three) times daily. 08/31/18   Chancy Milroy, MD  mupirocin ointment (BACTROBAN) 2 % Apply 1 application topically 2 (two) times daily. Patient not taking: Reported on 09/06/2018 08/27/18   Ok Edwards, PA-C  SUMAtriptan (IMITREX) 100 MG tablet Take 100 mg by mouth every 2 (two) hours as  needed for migraine. May repeat in 2 hours if headache persists or recurs.    [provider]  triamcinolone cream (KENALOG) 0.5 % Apply 1 application topically 6 (six) times daily.  09/22/17   [provider]    Family History Family History  Problem Relation Age of Onset  . Heart disease Mother   . Heart disease Maternal Grandmother     Social History Social History   Tobacco Use  . Smoking status: Former Smoker    Packs/day: 0.50    Years: 13.00    Pack years: 6.50    Types: Cigarettes    Quit date: 2017    Years since quitting: 3.5  . Smokeless tobacco: Never Used  Substance Use Topics  . Alcohol use: No    Alcohol/week: 0.0 standard drinks  . Drug use: Yes    Types: Marijuana    Comment: LAST USE SUN 08/28/18     Allergies   Coconut oil, Tape, Benadryl [diphenhydramine], Promethazine, Ampicillin, and Latex   Review of Systems Review of Systems  Constitutional: Negative for fatigue and fever.  HENT: Negative for mouth sores.   Eyes: Negative for visual disturbance.  Respiratory: Negative for shortness of breath.   Cardiovascular: Negative for chest pain.  Gastrointestinal: Negative for abdominal pain, nausea and vomiting.  Genitourinary: Positive for pelvic pain and vaginal pain. Negative for difficulty urinating.  Musculoskeletal: Negative for arthralgias and joint swelling.  Skin: Negative for color change, rash and wound.  Neurological: Negative for dizziness, weakness, light-headedness and headaches.     Physical Exam Triage Vital Signs ED Triage Vitals [09/15/18 1443]  Enc Vitals Group     BP 119/83     Pulse Rate 98     Resp 18     Temp (!) 97.5 F (36.4 C)     Temp Source Temporal     SpO2 95 %     Weight      Height      Head Circumference      Peak Flow      Pain Score 10     Pain Loc      Pain Edu?      Excl. in [place]?    No data found.  Updated Vital Signs BP 119/83 (BP Location: Right Arm)   Pulse 98   Temp (!)  97.5 F (36.4 C) (Temporal)   Resp 18   LMP  (LMP Unknown)   SpO2 95%   Visual Acuity Right Eye Distance:   Left Eye Distance:   Bilateral Distance:    Right Eye Near:   Left Eye Near:    Bilateral Near:  Physical Exam Vitals signs and nursing note reviewed.  Constitutional:      Appearance: She is well-developed.     Comments: No acute distress  HENT:     Head: Normocephalic and atraumatic.     Nose: Nose normal.  Eyes:     Conjunctiva/sclera: Conjunctivae normal.  Neck:     Musculoskeletal: Neck supple.  Cardiovascular:     Rate and Rhythm: Normal rate.  Pulmonary:     Effort: Pulmonary effort is normal. No respiratory distress.  Abdominal:     General: There is no distension.  Genitourinary:    Comments: Balloon of drain inflated and seen in introitus area, drain tube appears to be entering vaginal area, right Bartholin's gland with incision visible, tender to touch.   After drain removed, pus was seen visible on drain tube Musculoskeletal: Normal range of motion.  Skin:    General: Skin is warm and dry.  Neurological:     Mental Status: She is alert and oriented to person, place, and time.      UC Treatments / Results  Labs (all labs ordered are listed, but only abnormal results are displayed) Labs Reviewed - No data to display  EKG   Radiology No results found.  Procedures Procedures (including critical care time)  Medications Ordered in UC Medications - No data to display  Initial Impression / Assessment and Plan / UC Course  I have reviewed the triage vital signs and the nursing notes.  Pertinent labs & imaging results that were available during my care of the patient were reviewed by me and considered in my medical decision making (see chart for details).    Drain removed given balloon outside of Bartholin's gland.  Advised patient to follow-up with OB/GYN and contact them to make aware of this.  Unclear how drain became displaced.   Patient has allergy to ampicillin, will place on doxycycline to cover for infection.  Continue to monitor,Discussed strict return precautions. Patient verbalized understanding and is agreeable with plan.  Final Clinical Impressions(s) / UC Diagnoses   Final diagnoses:  Bartholin's cyst     Discharge Instructions     Please follow up with OBGYN Begin antibiotic  Warm compresses  Use anti-inflammatories for pain/swelling. You may take up to 800 mg Ibuprofen every 8 hours with food. You may supplement Ibuprofen with Tylenol [phone] mg every 8 hours.     ED Prescriptions    Medication Sig Dispense Auth. Provider   amoxicillin-clavulanate (AUGMENTIN) 875-125 MG tablet  (Status: Discontinued) Take 1 tablet by mouth every 12 (twelve) hours for 7 days. 14 tablet Angelea Penny C, PA-C   doxycycline (VIBRAMYCIN) 100 MG capsule Take 1 capsule (100 mg total) by mouth 2 (two) times daily for 10 days. 20 capsule Kashawna Manzer C, PA-C     Controlled Substance Prescriptions [place] Controlled Substance Registry consulted? Not Applicable   Janith Lima, [place] 09/15/18 1616

## 2018-09-23 ENCOUNTER — Ambulatory Visit (INDEPENDENT_AMBULATORY_CARE_PROVIDER_SITE_OTHER): Payer: Medicaid Other | Admitting: Obstetrics and Gynecology

## 2018-09-23 ENCOUNTER — Other Ambulatory Visit: Payer: Self-pay

## 2018-09-23 ENCOUNTER — Encounter: Payer: Self-pay | Admitting: Obstetrics and Gynecology

## 2018-09-23 VITALS — BP 110/67 | HR 75 | Wt 164.0 lb

## 2018-09-23 DIAGNOSIS — Z9889 Other specified postprocedural states: Secondary | ICD-10-CM

## 2018-09-23 NOTE — Progress Notes (Signed)
Diane Garrison presents for post op from TVH/BS on 08/30/18 She had a Bartolin's cyst vs labial hematome drained with ward cathter on 09/06/18. Cathter removed on 09/15/18 She reports some vaginal discharge. No bowel or bladder dysfunction Pathology reviewed with pt  PE AF VSS Lungs clear Heart RRR Abd soft + BS GU I & D site healing well, silver nitrate applied, cuff healing well, no cuff or pelvic tenderness  A/P Post OP TVH/BS  Return to normal ADL's as tolerates F/U in 1 yr or PRN

## 2018-10-04 ENCOUNTER — Encounter: Payer: Medicaid Other | Admitting: Obstetrics and Gynecology

## 2019-03-03 DIAGNOSIS — N907 Vulvar cyst: Secondary | ICD-10-CM

## 2019-03-03 HISTORY — DX: Vulvar cyst: N90.7

## 2019-06-29 DIAGNOSIS — L309 Dermatitis, unspecified: Secondary | ICD-10-CM | POA: Diagnosis not present

## 2019-06-29 DIAGNOSIS — F329 Major depressive disorder, single episode, unspecified: Secondary | ICD-10-CM | POA: Diagnosis not present

## 2019-07-04 ENCOUNTER — Ambulatory Visit (INDEPENDENT_AMBULATORY_CARE_PROVIDER_SITE_OTHER): Payer: Medicaid Other

## 2019-07-04 ENCOUNTER — Encounter: Payer: Self-pay | Admitting: Sports Medicine

## 2019-07-04 ENCOUNTER — Ambulatory Visit: Payer: Medicaid Other | Admitting: Sports Medicine

## 2019-07-04 ENCOUNTER — Other Ambulatory Visit: Payer: Self-pay

## 2019-07-04 DIAGNOSIS — S9031XA Contusion of right foot, initial encounter: Secondary | ICD-10-CM | POA: Diagnosis not present

## 2019-07-04 DIAGNOSIS — S99921A Unspecified injury of right foot, initial encounter: Secondary | ICD-10-CM

## 2019-07-04 DIAGNOSIS — M79671 Pain in right foot: Secondary | ICD-10-CM

## 2019-07-04 MED ORDER — IBUPROFEN 800 MG PO TABS: 800.0000 mg | ORAL_TABLET | Freq: Three times a day (TID) | ORAL | 0 refills | Status: DC | PRN

## 2019-07-04 NOTE — Progress Notes (Signed)
Subjective: Shaynia Alen is a 34 y.o. female patient who returns to office for evaluation of right foot pain. Patient complains of progressive pain especially over the last month that has not gotten better with pain with walking that started after a chair fell on top of her foot. Patient denies any other pedal complaints. Denies any other causative factors. No other issues noted.   Patient Active Problem List   Diagnosis Date Noted  . Post-operative state 08/30/2018  . Abnormal auditory perception of both ears 10/15/2017  . Bilateral temporomandibular joint pain 10/15/2017  . Moderate persistent asthma without complication 123456  . Tobacco use 08/02/2014  . Allergic rhinitis 07/13/2012  . Headache, menstrual migraine 07/13/2012  . Vitamin D deficiency 01/05/2010  . Hypertrophy of breast 02/04/2009  . Mastodynia 02/04/2009  . Contact dermatitis and other eczema, due to unspecified cause 07/19/2008  . Herpes simplex virus (HSV) infection 07/19/2008    Current Outpatient Medications on File Prior to Visit  Medication Sig Dispense Refill  . albuterol (PROVENTIL HFA;VENTOLIN HFA) 108 (90 BASE) MCG/ACT inhaler Inhale 2 puffs into the lungs every 6 (six) hours as needed for wheezing or shortness of breath. For shortness of breath.     . cetirizine (ZYRTEC ALLERGY) 10 MG tablet Take 1 tablet (10 mg total) by mouth daily. 30 tablet 1  . Crisaborole (EUCRISA) 2 % OINT Apply topically.    . fluticasone (FLONASE) 50 MCG/ACT nasal spray Place 2 sprays into both nostrils daily. 16 g 1  . mupirocin ointment (BACTROBAN) 2 % Apply 1 application topically 2 (two) times daily. 22 g 0  . sertraline (ZOLOFT) 50 MG tablet Take by mouth.    . SUMAtriptan (IMITREX) 100 MG tablet Take 100 mg by mouth every 2 (two) hours as needed for migraine. May repeat in 2 hours if headache persists or recurs.    . triamcinolone cream (KENALOG) 0.5 % Apply 1 application topically 6 (six) times daily.      No  current facility-administered medications on file prior to visit.    Allergies  Allergen Reactions  . Coconut Oil Swelling  . Tape Rash    Adhesive tape allergy.  Paper tape is okay.  . Benadryl [Diphenhydramine] Swelling    Tongue swelled  . Promethazine Nausea And Vomiting  . Ampicillin Rash    Did it involve swelling of the face/tongue/throat, SOB, or low BP? Unknown Did it involve sudden or severe rash/hives, skin peeling, or any reaction on the inside of your mouth or nose? Unknown Did you need to seek medical attention at a hospital or doctor's office? No When did it last happen?9 + year If all above answers are "NO", may proceed with cephalosporin use. Pt was given ampicillin in hospital, was asleep when she reacted to it.   . Latex Rash    Objective:  General: Alert and oriented x3 in no acute distress  Dermatology: Surgical site at right fifth toe, well-healed. No open lesions bilateral lower extremities, no webspace macerations, faint ecchymosis dorsal right foot, all nails x 10 are well manicured.  Vascular: Dorsalis Pedis and Posterior Tibial pedal pulses palpable, Capillary Fill Time 3 seconds,(+) pedal hair growth bilateral, mild edema right dorsal foot, Temperature gradient within normal limits.  Neurology: Johney Maine sensation intact via light touch bilateral, (- )Tinels sign bilateral.   Musculoskeletal: Mild tenderness with palpation at right dorsal foot at midfoot. Strength within normal limits in all groups bilateral.   Gait: Mild Antalgic gait  Xrays  Right Foot   Impression:s/p 5th PIPJ arthroplasy. No acute fracture. Mild soft tissue swelling at midfoot. No other acute findings.   Assessment and Plan: Problem List Items Addressed This Visit    None    Visit Diagnoses    Injury of right foot, initial encounter    -  Primary   Relevant Orders   DG Foot Complete Right (Completed)   Contusion of right foot, initial encounter       Right foot pain            -Complete examination performed -Xrays reviewed -Discussed treatement options for contusion injury right foot -Dispensed surgigrip compression sleeve to wear to give supportive and for edema control -Recommend PRICE protocol and over-the-counter anti-inflammatories as needed for pain; Rx Motrin 800 -Recommend if pain continues to return to using post op shoe that she has at home -Advised if pain is not better in 2 weeks to call for MRI -Patient to return to office as needed or sooner if condition worsens.  Landis Martins, DPM

## 2019-07-18 DIAGNOSIS — L732 Hidradenitis suppurativa: Secondary | ICD-10-CM | POA: Diagnosis not present

## 2019-07-18 DIAGNOSIS — D2330 Other benign neoplasm of skin of unspecified part of face: Secondary | ICD-10-CM | POA: Diagnosis not present

## 2019-07-18 DIAGNOSIS — L2084 Intrinsic (allergic) eczema: Secondary | ICD-10-CM | POA: Diagnosis not present

## 2019-07-27 DIAGNOSIS — F329 Major depressive disorder, single episode, unspecified: Secondary | ICD-10-CM | POA: Diagnosis not present

## 2019-09-19 ENCOUNTER — Encounter (HOSPITAL_COMMUNITY): Payer: Self-pay | Admitting: Emergency Medicine

## 2019-09-19 ENCOUNTER — Other Ambulatory Visit: Payer: Self-pay

## 2019-09-19 ENCOUNTER — Encounter (HOSPITAL_COMMUNITY): Payer: Self-pay

## 2019-09-19 ENCOUNTER — Emergency Department (HOSPITAL_COMMUNITY)
Admission: EM | Admit: 2019-09-19 | Discharge: 2019-09-19 | Disposition: A | Discharge status: Home / Self Care | Payer: Medicaid Other | Attending: Emergency Medicine | Admitting: Emergency Medicine

## 2019-09-19 ENCOUNTER — Emergency Department (HOSPITAL_COMMUNITY)
Admission: EM | Admit: 2019-09-19 | Discharge: 2019-09-19 | Discharge status: Against Medical Advice | Payer: Medicaid Other | Source: Home / Self Care

## 2019-09-19 DIAGNOSIS — M549 Dorsalgia, unspecified: Secondary | ICD-10-CM | POA: Insufficient documentation

## 2019-09-19 DIAGNOSIS — N10 Acute tubulo-interstitial nephritis: Secondary | ICD-10-CM | POA: Diagnosis not present

## 2019-09-19 DIAGNOSIS — Z5321 Procedure and treatment not carried out due to patient leaving prior to being seen by health care provider: Secondary | ICD-10-CM | POA: Diagnosis not present

## 2019-09-19 DIAGNOSIS — R109 Unspecified abdominal pain: Secondary | ICD-10-CM | POA: Diagnosis not present

## 2019-09-19 DIAGNOSIS — R300 Dysuria: Secondary | ICD-10-CM | POA: Diagnosis not present

## 2019-09-19 LAB — BASIC METABOLIC PANEL
Anion gap: 15 (ref 5–15)
BUN: 12 mg/dL (ref 6–20)
CO2: 18 mmol/L — ABNORMAL LOW (ref 22–32)
Calcium: 9.7 mg/dL (ref 8.9–10.3)
Chloride: 106 mmol/L (ref 98–111)
Creatinine, Ser: 0.86 mg/dL (ref 0.44–1.00)
GFR calc Af Amer: >60 mL/min (ref >60)
GFR calc non Af Amer: >60 mL/min (ref >60)
Glucose, Bld: 104 mg/dL — ABNORMAL HIGH (ref 70–99)
Potassium: 4 mmol/L (ref 3.5–5.1)
Sodium: 139 mmol/L (ref 135–145)

## 2019-09-19 LAB — CBC
HCT: 45.2 % (ref 36.0–46.0)
Hemoglobin: 15.2 g/dL — ABNORMAL HIGH (ref 12.0–15.0)
MCH: 32.4 pg (ref 26.0–34.0)
MCHC: 33.6 g/dL (ref 30.0–36.0)
MCV: 96.4 fL (ref 80.0–100.0)
Platelets: 267 10*3/uL (ref 150–400)
RBC: 4.69 MIL/uL (ref 3.87–5.11)
RDW: 12.7 % (ref 11.5–15.5)
WBC: 15.9 10*3/uL — ABNORMAL HIGH (ref 4.0–10.5)
nRBC: 0 % (ref 0.0–0.2)

## 2019-09-19 LAB — I-STAT BETA HCG BLOOD, ED (MC, WL, AP ONLY): I-stat hCG, quantitative: <5 m[IU]/mL (ref <5)

## 2019-09-19 MED ORDER — ONDANSETRON 4 MG PO TBDP
4.0000 mg | ORAL_TABLET | Freq: Once | ORAL | Status: AC
  Administered 2019-09-19: 4 mg via ORAL
  Filled 2019-09-19: qty 1

## 2019-09-19 MED ORDER — OXYCODONE-ACETAMINOPHEN 5-325 MG PO TABS
1.0000 | ORAL_TABLET | Freq: Once | ORAL | Status: AC
  Administered 2019-09-19: 1 via ORAL
  Filled 2019-09-19: qty 1

## 2019-09-19 NOTE — ED Notes (Signed)
Pt seen leaving the lobby and getting into car immediately after triage and given pain medication

## 2019-09-19 NOTE — ED Triage Notes (Signed)
Per pt, states she is having right flank pain-states she hasn't urinated since yesterday-patient crying, yelling at staff-states she was at [facility] and "they told me to come to [facility] ED due to them having a 10 hour wait"

## 2019-09-19 NOTE — ED Triage Notes (Signed)
Pt reports severe right sided back pain and flank pain that started this morning, pt unable to sit due to the pain. Pt tearful in triage.

## 2019-09-20 ENCOUNTER — Telehealth: Payer: Self-pay | Admitting: *Deleted

## 2019-09-20 NOTE — Telephone Encounter (Addendum)
Pt at [facility] 09/19/19 at 0721 but left after receiving medication; pt then went to [facility] at [phone]; please see detailed ED triage notes; patient presented to 2 emergency departments in the system <90 min apart in the last 24 hours.  Marnette Perkins [id number]@ presented to the ED and left before being seen by the provider on 09/19/19. The patient has been enrolled in an automated general discharge outreach program and 2 attempts to contact the patient will be made to follow up on their ED visit and subsequent needs. The care management team is available to provide assistance to this patient at any time.   Lenor Coffin, RN, BSN, Elsie Patient [place] [phone]

## 2019-10-30 DIAGNOSIS — Z20822 Contact with and (suspected) exposure to covid-19: Secondary | ICD-10-CM | POA: Diagnosis not present

## 2019-10-31 DIAGNOSIS — Z20822 Contact with and (suspected) exposure to covid-19: Secondary | ICD-10-CM | POA: Diagnosis not present

## 2019-12-11 DIAGNOSIS — J029 Acute pharyngitis, unspecified: Secondary | ICD-10-CM | POA: Diagnosis not present

## 2019-12-19 DIAGNOSIS — K219 Gastro-esophageal reflux disease without esophagitis: Secondary | ICD-10-CM | POA: Diagnosis not present

## 2019-12-19 DIAGNOSIS — R490 Dysphonia: Secondary | ICD-10-CM | POA: Diagnosis not present

## 2020-01-06 DIAGNOSIS — R1012 Left upper quadrant pain: Secondary | ICD-10-CM | POA: Diagnosis not present

## 2020-01-06 DIAGNOSIS — R112 Nausea with vomiting, unspecified: Secondary | ICD-10-CM | POA: Diagnosis not present

## 2020-01-06 DIAGNOSIS — R1013 Epigastric pain: Secondary | ICD-10-CM | POA: Diagnosis not present

## 2020-01-06 DIAGNOSIS — N83201 Unspecified ovarian cyst, right side: Secondary | ICD-10-CM | POA: Diagnosis not present

## 2020-01-08 ENCOUNTER — Telehealth: Payer: Self-pay

## 2020-01-08 NOTE — Telephone Encounter (Signed)
Transition Care Management Unsuccessful Follow-up Telephone Call  Date of discharge and from where: 01/06/2020 [facility]  Attempts:  1st Attempt  Reason for unsuccessful TCM follow-up call:  Voice mail full

## 2020-01-09 NOTE — Telephone Encounter (Signed)
Transition Care Management Unsuccessful Follow-up Telephone Call  Date of discharge and from where:  01/06/2020 [facility]  Attempts:  2nd Attempt  Reason for unsuccessful TCM follow-up call:  Voice mail full

## 2020-01-10 NOTE — Telephone Encounter (Signed)
Transition Care Management Unsuccessful Follow-up Telephone Call  Date of discharge and from where:  11/6/2021[facility]  Attempts:  3rd Attempt  Reason for unsuccessful TCM follow-up call:  Voice mail full

## 2020-01-17 DIAGNOSIS — R0981 Nasal congestion: Secondary | ICD-10-CM | POA: Diagnosis not present

## 2020-01-17 DIAGNOSIS — Z20822 Contact with and (suspected) exposure to covid-19: Secondary | ICD-10-CM | POA: Diagnosis not present

## 2020-01-18 DIAGNOSIS — Z20822 Contact with and (suspected) exposure to covid-19: Secondary | ICD-10-CM | POA: Diagnosis not present

## 2020-01-18 DIAGNOSIS — R0981 Nasal congestion: Secondary | ICD-10-CM | POA: Diagnosis not present

## 2020-02-16 DIAGNOSIS — J4540 Moderate persistent asthma, uncomplicated: Secondary | ICD-10-CM | POA: Diagnosis not present

## 2020-02-16 DIAGNOSIS — K5909 Other constipation: Secondary | ICD-10-CM | POA: Diagnosis not present

## 2020-02-16 DIAGNOSIS — G43909 Migraine, unspecified, not intractable, without status migrainosus: Secondary | ICD-10-CM | POA: Diagnosis not present

## 2020-02-16 DIAGNOSIS — K219 Gastro-esophageal reflux disease without esophagitis: Secondary | ICD-10-CM | POA: Diagnosis not present

## 2020-02-28 DIAGNOSIS — R059 Cough, unspecified: Secondary | ICD-10-CM | POA: Diagnosis not present

## 2020-02-28 DIAGNOSIS — R0981 Nasal congestion: Secondary | ICD-10-CM | POA: Diagnosis not present

## 2020-03-07 DIAGNOSIS — J029 Acute pharyngitis, unspecified: Secondary | ICD-10-CM | POA: Diagnosis not present

## 2020-03-07 DIAGNOSIS — Z20822 Contact with and (suspected) exposure to covid-19: Secondary | ICD-10-CM | POA: Diagnosis not present

## 2020-03-14 ENCOUNTER — Ambulatory Visit: Payer: Medicaid Other

## 2020-04-05 ENCOUNTER — Ambulatory Visit: Payer: Medicaid Other

## 2020-04-09 DIAGNOSIS — F331 Major depressive disorder, recurrent, moderate: Secondary | ICD-10-CM | POA: Insufficient documentation

## 2020-04-09 DIAGNOSIS — F5104 Psychophysiologic insomnia: Secondary | ICD-10-CM | POA: Insufficient documentation

## 2020-04-15 ENCOUNTER — Encounter: Payer: Self-pay | Admitting: Speech Pathology

## 2020-04-15 ENCOUNTER — Ambulatory Visit: Payer: Commercial Managed Care - PPO | Attending: Physician Assistant | Admitting: Speech Pathology

## 2020-04-15 ENCOUNTER — Other Ambulatory Visit: Payer: Self-pay

## 2020-04-15 DIAGNOSIS — R498 Other voice and resonance disorders: Secondary | ICD-10-CM | POA: Insufficient documentation

## 2020-04-15 NOTE — Therapy (Signed)
[name] [address] [place], [place], [address] Phone: [phone]   Fax:  [phone]  Speech Language Pathology Evaluation  Patient Details  Name: Diane Garrison MRN: [id number] Date of Birth: 1985/06/09 Referring Provider (SLP): Jolene Provost, PA-C   Encounter Date: 04/15/2020   End of Session - 04/15/20 1514    Visit Number 1    Number of Visits 17    Date for SLP Re-Evaluation 06/10/20    Authorization Type medicaid - await auth for 1st 3 visits, then request more as needed    SLP Start Time 1245    SLP Stop Time  1318    SLP Time Calculation (min) 33 min    Activity Tolerance Patient tolerated treatment well           Past Medical History:  Diagnosis Date  . Allergy   . Anemia   . Anxiety   . Asthma   . Bipolar disorder ([place])    Bipolar 2  . Cigarette smoker   . Depression   . Insomnia   . Migraine   . Panic attacks     Past Surgical History:  Procedure Laterality Date  . CESAREAN SECTION  2004,2006,2007,2008   x 4  . CHOLECYSTECTOMY    . DILITATION & CURRETTAGE/HYSTROSCOPY WITH HYDROTHERMAL ABLATION N/A 03/16/2014   Procedure: DILATATION & CURETTAGE/HYSTEROSCOPY WITH HYDROTHERMAL ABLATION;  Surgeon: Shelly Bombard, MD;  Location: [facility];  Service: Gynecology;  Laterality: N/A;  . FOOT SURGERY  2018, 04/2018    right foot 2018, left foot 2020  . TUBAL LIGATION  03/20/2007  . VAGINAL HYSTERECTOMY Bilateral 08/30/2018   Procedure: HYSTERECTOMY VAGINAL WITH SALPINGECTOMY;  Surgeon: Chancy Milroy, MD;  Location: [facility];  Service: Gynecology;  Laterality: Bilateral;  . WISDOM TOOTH EXTRACTION      There were no vitals filed for this visit.   Subjective Assessment - 04/15/20 1253    Subjective "I got called off from work because of my voice"    Currently in Pain? No/denies              SLP Evaluation [facility] - 04/15/20 1253      SLP Visit Information   SLP Received On  04/15/20    Referring Provider (SLP) Jolene Provost, PA-C    Onset Date 1 year ago    Medical Diagnosis hoarseness      Subjective   Patient/Family Stated Goal To talk easier and not get sent home from work      General Information   HPI She reports hoarseness approx 1 year ago. ENT consult with flexible scop revealed normal vocal folds without nodule or tumor. Moderate post glottic pachydema. Denies dysphagia, endorses GERD. ENT noted LPR, however pt is not on PPI or H2 blocker at this time.    Mobility Status walks independently      Balance Screen   Has the patient fallen in the past 6 months No    Has the patient had a decrease in activity level because of a fear of falling?  No    Is the patient reluctant to leave their home because of a fear of falling?  No      Prior Functional Status   Cognitive/Linguistic Baseline Within functional limits    Type of Home Apartment     Lives With Son;Daughter   4 teens   Vocation Full time employment      Oral Motor/Sensory Function   Overall Oral  Motor/Sensory Function Appears within functional limits for tasks assessed      Motor Speech   Phonation Hoarse    Resonance Within functional limits    Intelligibility Intelligible    Phonation Impaired    Vocal Abuses Habitual Cough/Throat Clear;Prolonged Vocal Use;Habitual Hyperphonia    Tension Present Neck    Volume Appropriate    Pitch Appropriate   to slightly low                            SLP Short Term Goals - 04/15/20 1537      SLP SHORT TERM GOAL #1   Title Pt will report following 3 aspects of vocal hygiene/conservation over 3 therapy sessions    Baseline not following vocal hygiene    Time 4    Period Weeks    Status New      SLP SHORT TERM GOAL #2   Title Pt will perform HEP for voice with rare min A    Baseline no voice function exercise program    Time 4    Period Weeks    Status New      SLP SHORT TERM GOAL #3   Title Pt will demonstrate  clear phonation over 5 minute conversation with rare min A    Baseline consistently hoarse    Time 4    Period Weeks    Status New            SLP Long Term Goals - 04/15/20 1540      SLP LONG TERM GOAL #1   Title Pt will report following 5 aspects of vocal hygiene/conservation program over 3 sessions    Baseline no voice hygiene strategies    Time 8    Period Weeks    Status New      SLP LONG TERM GOAL #2   Title Pt will improve Voice Related Quality of Life (V-RQOL) score by 5 points    Baseline calculated score is 27.5    Time 8    Period Weeks    Status New      SLP LONG TERM GOAL #3   Title Pt will maintain clear phonation and vocal endurance over 15 minute conversation with rare min A    Baseline consistently hoarse    Time 8    Period Weeks    Status New      SLP LONG TERM GOAL #4   Title Perform HEP for voice with mod I    Baseline no vocal exercise program    Time 8    Period Weeks    Status New      SLP LONG TERM GOAL #5   Title Pt will report no missed shifts due to being called off for hoarse voice    Baseline Pt has been called off 3x, including for 10 days    Time 8    Period Weeks    Status New            Plan - 04/15/20 1515    Clinical Impression Statement [name] is referred by ENT due to persistent hoarseness. 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 She has been sent home several times due to hoarseness and required to be out for 10 days due to Covid, however test negative. Diane Garrison also has 4 teens at home and reports she talks frequently after work to care for  her kids. She demosntrated 8 hard throat clears in our 35 minute session. She endorses clearing her throat "all day." She is not aware of vocal hygiene strategies. She reports her voice is hoarse upon rising, and she endorses frequent throat clearing. She endorses vocal fatigue and strain. Denies intermittent aphonia. 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 Today she presents with mild to moderate dysphonia. Glottal Functionn Index is 15 (<4 is [facility]). Reflux Symptom Index is 45 (>13 is abnormal). Voice Related Quality of Life (V-RQOL) Measure is 39 (<80 being WNL). Habitual pitch is 174.6Hz , which is low normal for age/gender. Pitch range in reading task was 73.4 Hz to 185 Hz.. She feels her pitch is low and she "sounds like a man." Maximum Phonation Time for /a/ is 12.5 seconds (<12 is suggestive of glottal/respiratory insufficiency. /s/ to /z/ is 1.5, (1 is WNL, >1.4 suggestive of laryngeal pathology). As pt's voice quality is affecting her ability to work, I recommend skilled ST to maximize clear and efficient phonation. I also recommend consult with laryngologist for videostroboscopy of the vocal folds.    Speech Therapy Frequency 2x / week    Duration 8 weeks   await medicaid approval   Treatment/Interventions Aspiration precaution training;SLP instruction and feedback;Compensatory strategies;Functional tasks;Compensatory techniques;Patient/family education;Multimodal communcation approach;Internal/external aids;Diet toleration management by SLP;Environmental controls    Potential to Achieve Goals Good           Patient will benefit from skilled therapeutic intervention in order to improve the following deficits and impairments:   Other voice and resonance disorders    Problem List Patient Active Problem List   Diagnosis Date Noted  . Post-operative state 08/30/2018  . Abnormal auditory perception of both ears 10/15/2017  . Bilateral temporomandibular joint pain 10/15/2017  . Moderate persistent asthma without complication 40/10/6759  . Tobacco use 08/02/2014  . Allergic rhinitis 07/13/2012  . Headache, menstrual migraine 07/13/2012  . Vitamin D deficiency 01/05/2010  . Hypertrophy of breast 02/04/2009  . Mastodynia 02/04/2009  . Contact dermatitis and other eczema, due to unspecified cause  07/19/2008  . Herpes simplex virus (HSV) infection 07/19/2008    Lillianna Sabel, Annye Rusk MS, CCC-SLP 04/15/2020, 3:46 PM  [facility] [address] [place], [place], [address] Phone: [phone]   Fax:  [phone]  Name: Diane Garrison MRN: [id number] Date of Birth: 1985/09/25

## 2020-04-15 NOTE — Patient Instructions (Addendum)
   Sip of water with a couple of hard swallows, repeat until urge to clear your throat passes  Forceful blow "huh" then hard swallow instead of throat clear  Mmm-mmmm gentle throat clear if you must  Kids let you know when you clear your throat  Dr. Mila Homer  Dr. Carol Ada  [facility]

## 2020-04-17 ENCOUNTER — Other Ambulatory Visit: Payer: Self-pay

## 2020-04-17 ENCOUNTER — Ambulatory Visit: Payer: Commercial Managed Care - PPO | Admitting: Speech Pathology

## 2020-04-17 DIAGNOSIS — R498 Other voice and resonance disorders: Secondary | ICD-10-CM

## 2020-04-17 NOTE — Patient Instructions (Signed)
   Semi Occluded Vocal Tract Exercises  Warm up before work, at lunch and on the way home and anytime you feel you need to reset your voice  With bubbles and without bubbles:  10 breaths of hum 10 breaths of siren Song [employer], Merchant navy officer)  If you don't have a straw, you can try the same thing with lip trills or tongue trills  Use good energy conservation - try to find times when you can rest your voice - let you kids know you are resting your voice and if they need you they need to come close to you so you can speak low and gentle  Practice belly breathing   When you are asking questions on the phone, take a big belly breath before each question  Think about breathing more frequently in conversations  Put up a sticky that says "Belly Breath" to remind you  Instead of yelling to get the kids attention, try clapping or a bell or texting

## 2020-04-17 NOTE — Therapy (Signed)
[facility] [address] [place], [place], [address] Phone: [phone]   Fax:  [phone]  Speech Language Pathology Treatment  Patient Details  Name: Diane Garrison MRN: [id number] Date of Birth: [birth year] Referring Provider (SLP): Jolene Provost, PA-C   Encounter Date: 04/17/2020   End of Session - 04/17/20 1601    Visit Number 2    Number of Visits 17    Date for SLP Re-Evaluation 06/10/20    Authorization Type medicaid - await auth for 1st 3 visits, then request more as needed    SLP Start Time 1447    SLP Stop Time  1530    SLP Time Calculation (min) 43 min           Past Medical History:  Diagnosis Date  . Allergy   . Anemia   . Anxiety   . Asthma   . Bipolar disorder ([place])    Bipolar 2  . Cigarette smoker   . Depression   . Insomnia   . Migraine   . Panic attacks     Past Surgical History:  Procedure Laterality Date  . CESAREAN SECTION  2004,2006,2007,2008   x 4  . CHOLECYSTECTOMY    . DILITATION & CURRETTAGE/HYSTROSCOPY WITH HYDROTHERMAL ABLATION N/A 03/16/2014   Procedure: DILATATION & CURETTAGE/HYSTEROSCOPY WITH HYDROTHERMAL ABLATION;  Surgeon: Shelly Bombard, MD;  Location: [facility];  Service: Gynecology;  Laterality: N/A;  . FOOT SURGERY  2018, 04/2018    right foot 2018, left foot 2020  . TUBAL LIGATION  03/20/2007  . VAGINAL HYSTERECTOMY Bilateral 08/30/2018   Procedure: HYSTERECTOMY VAGINAL WITH SALPINGECTOMY;  Surgeon: Chancy Milroy, MD;  Location: [facility];  Service: Gynecology;  Laterality: Bilateral;  . WISDOM TOOTH EXTRACTION      There were no vitals filed for this visit.   Subjective Assessment - 04/17/20 1543    Subjective "I've been working on not clearing my throat"    Currently in Pain? No/denies                 ADULT SLP TREATMENT - 04/17/20 1545      General Information   Behavior/Cognition Alert;Cooperative;Pleasant mood      Treatment  Provided   Treatment provided Cognitive-Linquistic      Cognitive-Linquistic Treatment   Treatment focused on Aphasia;Patient/family/caregiver education    Skilled Treatment Diane Garrison has had her co-workers and family help her Id when she clears her throat. She demonstrated throat clear alternatives this session with rare min A and reduced throat clears to 2x this session. Initiated training in semi-occluded vocal tract exercises (SOVTE) - Diane Garrison completed these with occasional min A to rare min A. Instructed her to complete these before work, at lunch and on the way home and with vocal fatigue. Targeted abdominal breathing in isolation with usual mod A initially reduced to occasional min A. Diane Garrison has attempted to reduce volume and strain at work, however she was told she was talking too low. She required usual mod A to use abdominal breathing with speech (reading sentences).      Assessment / Recommendations / Plan   Plan Continue with current plan of care      Progression Toward Goals   Progression toward goals Progressing toward goals            SLP Education - 04/17/20 1558    Education Details SOVTE, abdominal breathing, vocal conservation    Person(s) Educated Patient    Methods Explanation;Demonstration;Verbal cues;Handout  Comprehension Verbalized understanding;Returned demonstration;Verbal cues required;Need further instruction            SLP Short Term Goals - 04/17/20 1600      SLP SHORT TERM GOAL #1   Title Pt will report following 3 aspects of vocal hygiene/conservation over 3 therapy sessions    Baseline not following vocal hygiene    Time 4    Period Weeks    Status On-going      SLP SHORT TERM GOAL #2   Title Pt will perform HEP for voice with rare min A    Baseline no voice function exercise program    Time 4    Period Weeks    Status On-going      SLP SHORT TERM GOAL #3   Title Pt will demonstrate clear phonation over 5 minute conversation with rare min A     Baseline consistently hoarse    Time 4    Period Weeks    Status On-going            SLP Long Term Goals - 04/17/20 1600      SLP LONG TERM GOAL #1   Title Pt will report following 5 aspects of vocal hygiene/conservation program over 3 sessions    Baseline no voice hygiene strategies    Time 8    Period Weeks    Status On-going      SLP LONG TERM GOAL #2   Title Pt will improve Voice Related Quality of Life (V-RQOL) score by 5 points    Baseline calculated score is 27.5    Time 8    Period Weeks    Status On-going      SLP LONG TERM GOAL #3   Title Pt will maintain clear phonation and vocal endurance over 15 minute conversation with rare min A    Baseline consistently hoarse    Time 8    Period Weeks    Status On-going      SLP LONG TERM GOAL #4   Title Perform HEP for voice with mod I    Baseline no vocal exercise program    Time 8    Period Weeks    Status On-going      SLP LONG TERM GOAL #5   Title Pt will report no missed shifts due to being called off for hoarse voice    Baseline Pt has been called off 3x, including for 10 days    Time 8    Period Weeks    Status On-going            Plan - 04/17/20 1558    Clinical Impression Statement Diane Garrison is referred by ENT due to persistent hoarseness. 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 She has been sent home several times due to hoarseness and required to be out for 10 days due to Covid, however test negative. Diane Garrison also has 4 teens at home and reports she talks frequently after work to care for her kids. She demosntrated 8 hard throat clears in our 35 minute session. She endorses clearing her throat "all day." She is not aware of vocal hygiene strategies. She reports her voice is hoarse upon rising, and she endorses frequent throat clearing. She endorses vocal fatigue and strain. Denies intermittent aphonia. History of LPR, not on reflux meds at this  time. Today she presents with mild to moderate dysphonia. Glottal Functionn Index is 15 (<4 is  WFL). Reflux Symptom Index is 45 (>13 is abnormal). Voice Related Quality of Life (V-RQOL) Measure is 39 (<80 being WNL). Habitual pitch is 174.6Hz , which is low normal for age/gender. Pitch range in reading task was 73.4 Hz to 185 Hz.. She feels her pitch is low and she "sounds like a man." Maximum Phonation Time for /a/ is 12.5 seconds (<12 is suggestive of glottal/respiratory insufficiency. /s/ to /z/ is 1.5, (1 is WNL, >1.4 suggestive of laryngeal pathology). As pt's voice quality is affecting her ability to work, I recommend skilled ST to maximize clear and efficient phonation. I also recommend consult with laryngologist for videostroboscopy of the vocal folds.    Speech Therapy Frequency 2x / week    Duration 8 weeks   17 visits   Treatment/Interventions Aspiration precaution training;SLP instruction and feedback;Compensatory strategies;Functional tasks;Compensatory techniques;Patient/family education;Multimodal communcation approach;Internal/external aids;Diet toleration management by SLP;Environmental controls    Potential to Achieve Goals Good           Patient will benefit from skilled therapeutic intervention in order to improve the following deficits and impairments:   Other voice and resonance disorders    Problem List Patient Active Problem List   Diagnosis Date Noted  . Post-operative state 08/30/2018  . Abnormal auditory perception of both ears 10/15/2017  . Bilateral temporomandibular joint pain 10/15/2017  . Moderate persistent asthma without complication 40/34/7425  . Tobacco use 08/02/2014  . Allergic rhinitis 07/13/2012  . Headache, menstrual migraine 07/13/2012  . Vitamin D deficiency 01/05/2010  . Hypertrophy of breast 02/04/2009  . Mastodynia 02/04/2009  . Contact dermatitis and other eczema, due to unspecified cause 07/19/2008  . Herpes simplex virus (HSV) infection  07/19/2008    Ezana Hubbert, Annye Rusk MS, CCC-SLP 04/17/2020, 4:02 PM  [facility] [address] [place], [place], [address] Phone: [phone]   Fax:  [phone]   Name: Karina Nofsinger MRN: [id number] Date of Birth: [birth year]

## 2020-04-18 ENCOUNTER — Ambulatory Visit: Payer: Medicaid Other | Admitting: Sports Medicine

## 2020-04-26 ENCOUNTER — Other Ambulatory Visit: Payer: Self-pay

## 2020-04-26 ENCOUNTER — Ambulatory Visit: Payer: Commercial Managed Care - PPO | Admitting: Speech Pathology

## 2020-04-26 ENCOUNTER — Encounter: Payer: Self-pay | Admitting: Speech Pathology

## 2020-04-26 DIAGNOSIS — R498 Other voice and resonance disorders: Secondary | ICD-10-CM

## 2020-04-26 NOTE — Patient Instructions (Signed)
   Breathe in between each sentence at work  Bring in your script  Try to use a lower volume as you are able  After you take a breath, think about relaxing your throat and let the air flow out with ease  Listen for "pressed speech", which is a sign you need to breathe and let air flow as you talk  She Ellyn Hack  The teacher eats a peach  Feed the Fish  See Sue's Shoes  Who is Mallie Mussel?  These sounds are good to practice you air flow out of your voice box and throat

## 2020-04-26 NOTE — Therapy (Signed)
Diane Garrison [address] [place], [place], [address] Phone: [phone]   Fax:  [phone]  Speech Language Pathology Treatment  Patient Details  Name: Diane Garrison MRN: [id number] Date of Birth: February 18, 1986 Referring Provider (SLP): Jolene Provost, PA-C   Encounter Date: 04/26/2020   End of Session - 04/26/20 1153    Visit Number 3    Number of Visits 17    Date for SLP Re-Evaluation 06/10/20    Authorization Type medicaid - managed, no auth    SLP Start Time 1102    SLP Stop Time  1137    SLP Time Calculation (min) 35 min    Activity Tolerance Patient tolerated treatment well           Past Medical History:  Diagnosis Date  . Allergy   . Anemia   . Anxiety   . Asthma   . Bipolar disorder ([place])    Bipolar 2  . Cigarette smoker   . Depression   . Insomnia   . Migraine   . Panic attacks     Past Surgical History:  Procedure Laterality Date  . CESAREAN SECTION  2004,2006,2007,2008   x 4  . CHOLECYSTECTOMY    . DILITATION & CURRETTAGE/HYSTROSCOPY WITH HYDROTHERMAL ABLATION N/A 03/16/2014   Procedure: DILATATION & CURETTAGE/HYSTEROSCOPY WITH HYDROTHERMAL ABLATION;  Surgeon: Shelly Bombard, MD;  Location: [facility];  Service: Gynecology;  Laterality: N/A;  . FOOT SURGERY  2018, 04/2018    right foot 2018, left foot 2020  . TUBAL LIGATION  03/20/2007  . VAGINAL HYSTERECTOMY Bilateral 08/30/2018   Procedure: HYSTERECTOMY VAGINAL WITH SALPINGECTOMY;  Surgeon: Chancy Milroy, MD;  Location: [facility];  Service: Gynecology;  Laterality: Bilateral;  . WISDOM TOOTH EXTRACTION      There were no vitals filed for this visit.   Subjective Assessment - 04/26/20 1103    Subjective "It's better at home, but at work it's hard"    Currently in Pain? No/denies                 ADULT SLP TREATMENT - 04/26/20 1105      General Information   Behavior/Cognition Alert;Cooperative;Pleasant mood       Treatment Provided   Treatment provided Cognitive-Linquistic      Cognitive-Linquistic Treatment   Treatment focused on Voice;Patient/family/caregiver education    Skilled Treatment Diane Garrison reports she has reduced throat clears and is using throat clear alternatives, has completed HEP for voice, including Semi-Occluded Vocal Tract Exercises (SOVTE). No throat clears during this session. Today, we targeted breath support for speech, in structured speech tasks Diane Garrison demonstrated abdominal breathing for each sentence with occasional min A. In conversation, she required usual min A, visual cues and modeling to carryover breath support. Educated Diane Garrison on "pressed" speech due to laryngeal tension and trained her in flow speech at syllable and phrase level with occasional mod A. Diane Garrison is to bring in her script next session, so we can ID places to breath, as well as ID flow sounds she can slightly exaggerate in her script to reduce tension.      Assessment / Recommendations / Plan   Plan Continue with current plan of care      Progression Toward Goals   Progression toward goals Progressing toward goals            SLP Education - 04/26/20 1148    Education Details abdominal breathing, pressed vs flow speech    Person(s) Educated  Patient    Methods Explanation;Demonstration;Verbal cues;Handout    Comprehension Verbalized understanding;Returned demonstration;Verbal cues required;Need further instruction            SLP Short Term Goals - 04/26/20 1152      SLP SHORT TERM GOAL #1   Title Pt will report following 3 aspects of vocal hygiene/conservation over 3 therapy sessions    Baseline not following vocal hygiene    Time 3    Period Weeks    Status On-going      SLP SHORT TERM GOAL #2   Title Pt will perform HEP for voice with rare min A    Baseline no voice function exercise program    Time 3    Period Weeks    Status On-going      SLP SHORT TERM GOAL #3   Title Pt will demonstrate  clear phonation over 5 minute conversation with rare min A    Baseline consistently hoarse    Time 3    Period Weeks    Status On-going            SLP Long Term Goals - 04/26/20 1153      SLP LONG TERM GOAL #1   Title Pt will report following 5 aspects of vocal hygiene/conservation program over 3 sessions    Baseline no voice hygiene strategies    Time 7    Period Weeks    Status On-going      SLP LONG TERM GOAL #2   Title Pt will improve Voice Related Quality of Life (V-RQOL) score by 5 points    Baseline calculated score is 27.5    Time 7    Period Weeks    Status On-going      SLP LONG TERM GOAL #3   Title Pt will maintain clear phonation and vocal endurance over 15 minute conversation with rare min A    Baseline consistently hoarse    Time 7    Period Weeks    Status On-going      SLP LONG TERM GOAL #4   Title Perform HEP for voice with mod I    Baseline no vocal exercise program    Time 7    Period Weeks    Status On-going      SLP LONG TERM GOAL #5   Title Pt will report no missed shifts due to being called off for hoarse voice    Baseline Pt has been called off 3x, including for 10 days    Time 7    Period Weeks    Status On-going            Plan - 04/26/20 1149    Clinical Impression Statement Skarlet continues to report hoarseness and vocal fatigue at the end of each shift. She has reduced throat clearing significanlty and continues to work toward eliminating it. She has made an appointment with Dr. Encarnacion Slates (ENT) in April. Ongoing training of vocal hygiene, HEP for voice, breath support and flow phonation. 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    Speech Therapy Frequency 2x / week    Duration 8 weeks   17 visits   Treatment/Interventions Aspiration precaution training;SLP instruction and feedback;Compensatory strategies;Functional tasks;Compensatory techniques;Patient/family  education;Multimodal communcation approach;Internal/external aids;Diet toleration management by SLP;Environmental controls           Patient will benefit from skilled therapeutic intervention in order to improve the  following deficits and impairments:   Other voice and resonance disorders    Problem List Patient Active Problem List   Diagnosis Date Noted  . Post-operative state 08/30/2018  . Abnormal auditory perception of both ears 10/15/2017  . Bilateral temporomandibular joint pain 10/15/2017  . Moderate persistent asthma without complication 25/67/2091  . Tobacco use 08/02/2014  . Allergic rhinitis 07/13/2012  . Headache, menstrual migraine 07/13/2012  . Vitamin D deficiency 01/05/2010  . Hypertrophy of breast 02/04/2009  . Mastodynia 02/04/2009  . Contact dermatitis and other eczema, due to unspecified cause 07/19/2008  . Herpes simplex virus (HSV) infection 07/19/2008    Aishia Barkey, Annye Rusk MS, CCC-SLP 04/26/2020, 11:54 AM  [facility] [address] [place], [place], [address] Phone: [phone]   Fax:  [phone]   Name: Latessa Tillis MRN: [id number] Date of Birth: Jun 17, 1985

## 2020-04-29 ENCOUNTER — Ambulatory Visit: Payer: Commercial Managed Care - PPO | Admitting: Speech Pathology

## 2020-04-29 ENCOUNTER — Other Ambulatory Visit: Payer: Self-pay

## 2020-04-29 DIAGNOSIS — R498 Other voice and resonance disorders: Secondary | ICD-10-CM | POA: Diagnosis not present

## 2020-04-29 NOTE — Therapy (Signed)
[facility] [address] [place], [place], [address] Phone: [phone]   Fax:  [phone]  Speech Language Pathology Treatment  Patient Details  Name: Diane Garrison MRN: [id number] Date of Birth: 1985/08/23 Referring Provider (SLP): Diane Provost, PA-C   Encounter Date: 04/29/2020   End of Session - 04/29/20 1445    Visit Number 4    Number of Visits 17    Date for SLP Re-Evaluation 06/10/20    Authorization Type medicaid - managed, no auth    SLP Start Time 2330    SLP Stop Time  1440    SLP Time Calculation (min) 33 min    Activity Tolerance Patient tolerated treatment well           Past Medical History:  Diagnosis Date  . Allergy   . Anemia   . Anxiety   . Asthma   . Bipolar disorder ([place])    Bipolar 2  . Cigarette smoker   . Depression   . Insomnia   . Migraine   . Panic attacks     Past Surgical History:  Procedure Laterality Date  . CESAREAN SECTION  2004,2006,2007,2008   x 4  . CHOLECYSTECTOMY    . DILITATION & CURRETTAGE/HYSTROSCOPY WITH HYDROTHERMAL ABLATION N/A 03/16/2014   Procedure: DILATATION & CURETTAGE/HYSTEROSCOPY WITH HYDROTHERMAL ABLATION;  Surgeon: Diane Bombard, MD;  Location: [facility];  Service: Gynecology;  Laterality: N/A;  . FOOT SURGERY  2018, 04/2018    right foot 2018, left foot 2020  . TUBAL LIGATION  03/20/2007  . VAGINAL HYSTERECTOMY Bilateral 08/30/2018   Procedure: HYSTERECTOMY VAGINAL WITH SALPINGECTOMY;  Surgeon: Diane Milroy, MD;  Location: [facility];  Service: Gynecology;  Laterality: Bilateral;  . WISDOM TOOTH EXTRACTION      There were no vitals filed for this visit.   Subjective Assessment - 04/29/20 1410    Subjective ""It's bad today"    Currently in Pain? No/denies                 ADULT SLP TREATMENT - 04/29/20 1416      General Information   Behavior/Cognition Alert;Cooperative;Pleasant mood      Treatment Provided    Treatment provided Cognitive-Linquistic      Cognitive-Linquistic Treatment   Treatment focused on Voice;Patient/family/caregiver education    Skilled Treatment Diane Garrison enters the room with signficant strain, and harsh voice with pitch breaks and pressed speech. . Semiocculded vocal tract exercises (SOVTE) for 2  minutes and 5 sentences with flow sounds reduced strain and harshness. Diane Garrison required 2 verbal cues to use her voice with SOVTE as she was only blowing (1x in water, and 1x out of water). Diane Garrison required 1 cue for breath support during lengthy utterance. She reports she is trying to breathe more frequently at work. She will drop off her script today, as she forgot to bring it in. Her works strict enforcement of volume, and call times having to be less than 2 minutes are increasing laryngeal tesnion. Instructed Diane Garrison that she can adjust her voice using SOVTE and flow phonation. She conitnues to work on eliminating throat clear. She had 2 gentle throat clears today.      Assessment / Recommendations / Plan   Plan Continue with current plan of care      Progression Toward Goals   Progression toward goals Progressing toward goals            SLP Education - 04/29/20 1444  Education Details SOVTE, breath support, flow phonation    Person(s) Educated Patient    Methods Explanation;Demonstration;Verbal cues;Handout    Comprehension Verbalized understanding;Returned demonstration;Verbal cues required;Need further instruction            SLP Short Term Goals - 04/29/20 1445      SLP SHORT TERM GOAL #1   Title Pt will report following 3 aspects of vocal hygiene/conservation over 3 therapy sessions    Baseline not following vocal hygiene    Time 2    Period Weeks    Status On-going      SLP SHORT TERM GOAL #2   Title Pt will perform HEP for voice with rare min A    Baseline no voice function exercise program    Time 2    Period Weeks    Status On-going      SLP SHORT TERM  GOAL #3   Title Pt will demonstrate clear phonation over 5 minute conversation with rare min A    Baseline consistently hoarse    Time 2    Period Weeks    Status On-going            SLP Long Term Goals - 04/29/20 1445      SLP LONG TERM GOAL #1   Title Pt will report following 5 aspects of vocal hygiene/conservation program over 3 sessions    Baseline no voice hygiene strategies    Time 6    Period Weeks    Status On-going      SLP LONG TERM GOAL #2   Title Pt will improve Voice Related Quality of Life (V-RQOL) score by 5 points    Baseline calculated score is 27.5    Time 6    Period Weeks    Status On-going      SLP LONG TERM GOAL #3   Title Pt will maintain clear phonation and vocal endurance over 15 minute conversation with rare min A    Baseline consistently hoarse    Time 6    Period Weeks    Status On-going      SLP LONG TERM GOAL #4   Title Perform HEP for voice with mod I    Baseline no vocal exercise program    Time 6    Period Weeks    Status On-going      SLP LONG TERM GOAL #5   Title Pt will report no missed shifts due to being called off for hoarse voice    Baseline Pt has been called off 3x, including for 10 days    Time 7    Period Weeks    Status On-going            Plan - 04/29/20 1444    Clinical Impression Statement Diane Garrison continues to report hoarseness and vocal fatigue at the end of each shift. She has reduced throat clearing significanlty and continues to work toward eliminating it. She has made an appointment with Dr. Encarnacion Slates (ENT) in April. Ongoing training of vocal hygiene, HEP for voice, breath support and flow phonation. 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    Speech Therapy Frequency 2x / week    Duration 8 weeks   17 weeks   Treatment/Interventions Aspiration precaution training;SLP instruction and feedback;Compensatory strategies;Functional  tasks;Compensatory techniques;Patient/family education;Multimodal communcation approach;Internal/external aids;Diet toleration management by SLP;Environmental controls    Potential to Achieve Goals Good  Patient will benefit from skilled therapeutic intervention in order to improve the following deficits and impairments:   Other voice and resonance disorders    Problem List Patient Active Problem List   Diagnosis Date Noted  . Post-operative state 08/30/2018  . Abnormal auditory perception of both ears 10/15/2017  . Bilateral temporomandibular joint pain 10/15/2017  . Moderate persistent asthma without complication 94/49/6759  . Tobacco use 08/02/2014  . Allergic rhinitis 07/13/2012  . Headache, menstrual migraine 07/13/2012  . Vitamin D deficiency 01/05/2010  . Hypertrophy of breast 02/04/2009  . Mastodynia 02/04/2009  . Contact dermatitis and other eczema, due to unspecified cause 07/19/2008  . Herpes simplex virus (HSV) infection 07/19/2008    Diane Garrison, Annye Rusk MS, CCC-SLP 04/29/2020, 2:46 PM  [facility] [address] [place], [place], [address] Phone: [phone]   Fax:  [phone]   Name: Diane Garrison MRN: [id number] Date of Birth: Feb 14, 1986

## 2020-04-29 NOTE — Patient Instructions (Signed)
  Remember, when you use the straw, your voice has to be on  He hates his feet  Sugar smells sweet  Manus Gunning fell fives feet  Who is Collie Siad  Where is her shirt  How is she?  The children are having fun at the [employer] from [place] vowed to vote  Zebras at the zoo seek shade

## 2020-05-01 ENCOUNTER — Ambulatory Visit: Payer: Commercial Managed Care - PPO | Attending: Physician Assistant | Admitting: Speech Pathology

## 2020-05-01 ENCOUNTER — Encounter: Payer: Self-pay | Admitting: Speech Pathology

## 2020-05-01 ENCOUNTER — Other Ambulatory Visit: Payer: Self-pay

## 2020-05-01 DIAGNOSIS — R498 Other voice and resonance disorders: Secondary | ICD-10-CM | POA: Diagnosis present

## 2020-05-01 NOTE — Therapy (Signed)
[facility] [address] [place], [place], [address] Phone: [phone]   Fax:  [phone]  Speech Language Pathology Treatment  Patient Details  Name: Diane Garrison MRN: [id number] Date of Birth: 29-Sep-1985 Referring Provider (SLP): Diane Provost, PA-C   Encounter Date: 05/01/2020   End of Session - 05/01/20 1149    Visit Number 5    Number of Visits 17    Date for SLP Re-Evaluation 06/10/20    Authorization Type medicaid - managed, no auth    SLP Start Time 1103    SLP Stop Time  1145    SLP Time Calculation (min) 42 min    Activity Tolerance Patient tolerated treatment well           Past Medical History:  Diagnosis Date  . Allergy   . Anemia   . Anxiety   . Asthma   . Bipolar disorder ([place])    Bipolar 2  . Cigarette smoker   . Depression   . Insomnia   . Migraine   . Panic attacks     Past Surgical History:  Procedure Laterality Date  . CESAREAN SECTION  2004,2006,2007,2008   x 4  . CHOLECYSTECTOMY    . DILITATION & CURRETTAGE/HYSTROSCOPY WITH HYDROTHERMAL ABLATION N/A 03/16/2014   Procedure: DILATATION & CURETTAGE/HYSTEROSCOPY WITH HYDROTHERMAL ABLATION;  Surgeon: Shelly Bombard, MD;  Location: [facility];  Service: Gynecology;  Laterality: N/A;  . FOOT SURGERY  2018, 04/2018    right foot 2018, left foot 2020  . TUBAL LIGATION  03/20/2007  . VAGINAL HYSTERECTOMY Bilateral 08/30/2018   Procedure: HYSTERECTOMY VAGINAL WITH SALPINGECTOMY;  Surgeon: Chancy Milroy, MD;  Location: [facility];  Service: Gynecology;  Laterality: Bilateral;  . WISDOM TOOTH EXTRACTION      There were no vitals filed for this visit.          ADULT SLP TREATMENT - 05/01/20 1115      General Information   Behavior/Cognition Alert;Cooperative;Pleasant mood      Treatment Provided   Treatment provided Cognitive-Linquistic      Cognitive-Linquistic Treatment   Treatment focused on  Voice;Patient/family/caregiver education    Skilled Treatment She enters room with nasal congestion. She says she slept under the fan. Diane Garrison brought in her script for calls and we noted flow sounds where she can reduced pressed voice if needed on a call. She is completing Semi-Occluded Vocal Tract Exercises and flow phonation sentences with rare min A. Diane Garrison was able to unpress her voice last night in [place] by using /u/ pitch glides with success. Today, voice hoarse, however no harshness, strain or pitch breaks. No throat clears with mod I.      Assessment / Recommendations / Plan   Plan Continue with current plan of care            SLP Education - 05/01/20 1143    Education Details Added to HEP, SOVTE without straw    Person(s) Educated Patient    Methods Explanation;Demonstration;Handout    Comprehension Verbalized understanding;Returned demonstration;Verbal cues required            SLP Short Term Goals - 05/01/20 1148      SLP SHORT TERM GOAL #1   Title Pt will report following 3 aspects of vocal hygiene/conservation over 3 therapy sessions    Baseline not following vocal hygiene    Time 2    Period Weeks    Status Achieved      SLP  SHORT TERM GOAL #2   Title Pt will perform HEP for voice with rare min A    Baseline no voice function exercise program    Time 2    Period Weeks    Status Achieved      SLP SHORT TERM GOAL #3   Title Pt will demonstrate clear phonation over 5 minute conversation with rare min A    Baseline consistently hoarse    Time 2    Period Weeks    Status On-going            SLP Long Term Goals - 05/01/20 1148      SLP LONG TERM GOAL #1   Title Pt will report following 5 aspects of vocal hygiene/conservation program over 3 sessions    Baseline no voice hygiene strategies    Time 6    Period Weeks    Status On-going      SLP LONG TERM GOAL #2   Title Pt will improve Voice Related Quality of Life (V-RQOL) score by 5 points    Baseline  calculated score is 27.5    Time 6    Period Weeks    Status On-going      SLP LONG TERM GOAL #3   Title Pt will maintain clear phonation and vocal endurance over 15 minute conversation with rare min A    Baseline consistently hoarse    Time 6    Period Weeks    Status On-going      SLP LONG TERM GOAL #4   Title Perform HEP for voice with mod I    Baseline no vocal exercise program    Time 6    Period Weeks    Status On-going      SLP LONG TERM GOAL #5   Title Pt will report no missed shifts due to being called off for hoarse voice    Baseline Pt has been called off 3x, including for 10 days    Time 7    Period Weeks    Status On-going            Plan - 05/01/20 1143    Clinical Impression Statement Diane Garrison remains hoarse, however harsh, strangled voice with pitch breaks has resolved. [name] is using semiocculded vocal tract exercises (SOVTE) and flow phonation phrases/sentences to recalibrate her voice when she notes harsh, strangled voice. 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 She has eliminated throat clearing behavior. She continues to require cues to reduce vocal attack. 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 Continue skilled ST to maximize intelligilbity and vocal quality for success at work and reduce shifts she is sent home for due to voice problems.    Speech Therapy Frequency 2x / week    Duration 8 weeks   17 visits   Treatment/Interventions Aspiration precaution training;SLP instruction and feedback;Compensatory strategies;Functional tasks;Compensatory techniques;Patient/family education;Multimodal communcation approach;Internal/external aids;Diet toleration management by SLP;Environmental controls    Potential to Achieve Goals Good           Patient will benefit from skilled therapeutic intervention in order to improve the following deficits and impairments:    Other voice and resonance disorders    Problem List Patient Active Problem List   Diagnosis Date Noted  . Post-operative state 08/30/2018  . Abnormal auditory perception of both ears 10/15/2017  . Bilateral temporomandibular joint  pain 10/15/2017  . Moderate persistent asthma without complication 94/80/1655  . Tobacco use 08/02/2014  . Allergic rhinitis 07/13/2012  . Headache, menstrual migraine 07/13/2012  . Vitamin D deficiency 01/05/2010  . Hypertrophy of breast 02/04/2009  . Mastodynia 02/04/2009  . Contact dermatitis and other eczema, due to unspecified cause 07/19/2008  . Herpes simplex virus (HSV) infection 07/19/2008    Diane Garrison, Diane Rusk MS, CCC-SLP 05/01/2020, 11:49 AM  [facility] [address] [place], [place], [address] Phone: [phone]   Fax:  [phone]   Name: Diane Garrison MRN: [id number] Date of Birth: [birth year]

## 2020-05-01 NOTE — Patient Instructions (Signed)
   See [place] by the sea.  Zebras zig zag at the zoo  Teachers eat ripe peaches at the beach  Sarah should polish your new shoes  She may choose to remain detaches  Shawn shops at the same store  Feel the feline's fur  His shirt is now on sale at [employer] job using pitch glides with "oo" to recalibrate your voice

## 2020-05-02 ENCOUNTER — Ambulatory Visit (INDEPENDENT_AMBULATORY_CARE_PROVIDER_SITE_OTHER): Payer: Commercial Managed Care - PPO | Admitting: Sports Medicine

## 2020-05-02 ENCOUNTER — Encounter: Payer: Self-pay | Admitting: Sports Medicine

## 2020-05-02 DIAGNOSIS — L84 Corns and callosities: Secondary | ICD-10-CM | POA: Diagnosis not present

## 2020-05-02 DIAGNOSIS — M79674 Pain in right toe(s): Secondary | ICD-10-CM | POA: Diagnosis not present

## 2020-05-02 DIAGNOSIS — M79675 Pain in left toe(s): Secondary | ICD-10-CM

## 2020-05-02 DIAGNOSIS — L905 Scar conditions and fibrosis of skin: Secondary | ICD-10-CM

## 2020-05-02 MED ORDER — CLOTRIMAZOLE 1 % EX SOLN: CUTANEOUS | 0 refills | Status: DC

## 2020-05-02 NOTE — Patient Instructions (Signed)
OTC: Scar-a-away treatment for scar at toe

## 2020-05-02 NOTE — Progress Notes (Signed)
Subjective: Diane Garrison is a 35 y.o. female patient who presents to office for evaluation of Right> Left foot pain secondary to corn in between toe.  Patient reports that she tried a cushion but the cushions seem like it made it worse.  Patient reports that she also has noticed some discoloration over her surgical scars of her fifth toes and especially on her left knee scar is a little sore. Patient denies any other pedal complaints.   Patient Active Problem List   Diagnosis Date Noted  . Post-operative state 08/30/2018  . Abnormal auditory perception of both ears 10/15/2017  . Bilateral temporomandibular joint pain 10/15/2017  . Moderate persistent asthma without complication 60/73/7106  . Tobacco use 08/02/2014  . Allergic rhinitis 07/13/2012  . Headache, menstrual migraine 07/13/2012  . Vitamin D deficiency 01/05/2010  . Hypertrophy of breast 02/04/2009  . Mastodynia 02/04/2009  . Contact dermatitis and other eczema, due to unspecified cause 07/19/2008  . Herpes simplex virus (HSV) infection 07/19/2008    Current Outpatient Medications on File Prior to Visit  Medication Sig Dispense Refill  . albuterol (PROVENTIL HFA;VENTOLIN HFA) 108 (90 BASE) MCG/ACT inhaler Inhale 2 puffs into the lungs every 6 (six) hours as needed for wheezing or shortness of breath. For shortness of breath.    . cetirizine (ZYRTEC ALLERGY) 10 MG tablet Take 1 tablet (10 mg total) by mouth daily. 30 tablet 1  . Crisaborole (EUCRISA) 2 % OINT Apply topically.    . fluticasone (FLONASE) 50 MCG/ACT nasal spray Place 2 sprays into both nostrils daily. 16 g 1  . ibuprofen (ADVIL) 800 MG tablet Take 1 tablet (800 mg total) by mouth every 8 (eight) hours as needed. (Patient not taking: Reported on 04/15/2020) 30 tablet 0  . mupirocin ointment (BACTROBAN) 2 % Apply 1 application topically 2 (two) times daily. 22 g 0  . sertraline (ZOLOFT) 50 MG tablet Take by mouth.    . SUMAtriptan (IMITREX) 100 MG tablet Take  100 mg by mouth every 2 (two) hours as needed for migraine. May repeat in 2 hours if headache persists or recurs.    . triamcinolone cream (KENALOG) 0.5 % Apply 1 application topically 6 (six) times daily.      No current facility-administered medications on file prior to visit.    Allergies  Allergen Reactions  . Coconut Oil Swelling  . Tape Rash    Adhesive tape allergy.  Paper tape is okay.  . Benadryl [Diphenhydramine] Swelling    Tongue swelled  . Promethazine Nausea And Vomiting  . Ampicillin Rash    Did it involve swelling of the face/tongue/throat, SOB, or low BP? Unknown Did it involve sudden or severe rash/hives, skin peeling, or any reaction on the inside of your mouth or nose? Unknown Did you need to seek medical attention at a hospital or doctor's office? No When did it last happen?9 + year If all above answers are "NO", may proceed with cephalosporin use. Pt was given ampicillin in hospital, was asleep when she reacted to it.   . Latex Rash    Objective:  General: Alert and oriented x3 in no acute distress  Dermatology: Keratotic lesion present fourth webspace with skin lines transversing the lesion, pain is present with direct pressure to the lesion with a central nucleated core noted, no webspace macerations, surgical scars well-healed with very minimal thickening however on the left there is some thickening at the proximal distal aspect of the scar no ecchymosis bilateral, all nails  x 10 are well manicured.  Vascular: Dorsalis Pedis and Posterior Tibial pedal pulses 2/4, Capillary Fill Time 3 seconds, + pedal hair growth bilateral, no edema bilateral lower extremities, Temperature gradient within normal limits.  Neurology: Johney Maine sensation intact via light touch bilateral.  Musculoskeletal: Mild tenderness with palpation at the keratotic lesion site on Right, muscular strength 5/5 in all groups without pain or limitation on range of motion.  History of previous  fifth toe surgery.  Assessment and Plan: Problem List Items Addressed This Visit   None   Visit Diagnoses    Corn of toe    -  Primary   Pain of toe of right foot       Scar       Pain of toe of left foot          -Complete examination performed -Discussed treatment options for corn of toe and scar -Parred keratoic lesion using a 15 blade at fourth webspace -Prescribed clotrimazole solution for patient to use in between toes -Advised patient to use scar way treatment for painful scar on left -Encouraged good supportive shoes that do not rub toes -Patient to return to office in 4 weeks for toe check or sooner if condition worsens.  Landis Martins, DPM

## 2020-05-06 ENCOUNTER — Ambulatory Visit: Payer: Commercial Managed Care - PPO | Admitting: Speech Pathology

## 2020-05-08 ENCOUNTER — Ambulatory Visit: Payer: Commercial Managed Care - PPO

## 2020-05-20 ENCOUNTER — Telehealth: Payer: Self-pay | Admitting: Speech Pathology

## 2020-05-22 ENCOUNTER — Encounter: Payer: Medicaid Other | Admitting: Speech Pathology

## 2020-05-22 DIAGNOSIS — N83201 Unspecified ovarian cyst, right side: Secondary | ICD-10-CM | POA: Diagnosis not present

## 2020-05-22 DIAGNOSIS — R112 Nausea with vomiting, unspecified: Secondary | ICD-10-CM | POA: Diagnosis not present

## 2020-05-22 DIAGNOSIS — G8929 Other chronic pain: Secondary | ICD-10-CM | POA: Diagnosis not present

## 2020-05-22 DIAGNOSIS — R1031 Right lower quadrant pain: Secondary | ICD-10-CM | POA: Diagnosis not present

## 2020-05-23 ENCOUNTER — Ambulatory Visit: Payer: Commercial Managed Care - PPO

## 2020-05-30 ENCOUNTER — Encounter: Payer: Self-pay | Admitting: Sports Medicine

## 2020-05-30 ENCOUNTER — Ambulatory Visit (INDEPENDENT_AMBULATORY_CARE_PROVIDER_SITE_OTHER): Payer: Commercial Managed Care - PPO | Admitting: Sports Medicine

## 2020-05-30 ENCOUNTER — Other Ambulatory Visit: Payer: Self-pay

## 2020-05-30 DIAGNOSIS — M79674 Pain in right toe(s): Secondary | ICD-10-CM | POA: Diagnosis not present

## 2020-05-30 DIAGNOSIS — L84 Corns and callosities: Secondary | ICD-10-CM

## 2020-05-30 DIAGNOSIS — L905 Scar conditions and fibrosis of skin: Secondary | ICD-10-CM

## 2020-05-30 DIAGNOSIS — M2040 Other hammer toe(s) (acquired), unspecified foot: Secondary | ICD-10-CM | POA: Diagnosis not present

## 2020-05-30 NOTE — Progress Notes (Addendum)
Subjective: Diane Garrison is a 35 y.o. female patient who presents to office for evaluation of Right> Left foot pain secondary to corn in between toe.  Patient reports that there is no pain to the left but still hurts on right. Clotrimazole solution makes skin more hard. Patient denies any other pedal complaints. Wants to discuss other treatment options.    Patient Active Problem List   Diagnosis Date Noted  . Depression, major, recurrent, moderate ([place]) 04/09/2020  . Psychophysiological insomnia 04/09/2020  . Hoarseness 12/19/2019  . Laryngopharyngeal reflux (LPR) 12/19/2019  . Post-operative state 08/30/2018  . Abnormal auditory perception of both ears 10/15/2017  . Bilateral temporomandibular joint pain 10/15/2017  . Moderate persistent asthma without complication 27/78/2423  . Tobacco use 08/02/2014  . Allergic rhinitis 07/13/2012  . Headache, menstrual migraine 07/13/2012  . Vitamin D deficiency 01/05/2010  . Hypertrophy of breast 02/04/2009  . Mastodynia 02/04/2009  . Contact dermatitis and other eczema, due to unspecified cause 07/19/2008  . Herpes simplex virus (HSV) infection 07/19/2008    Current Outpatient Medications on File Prior to Visit  Medication Sig Dispense Refill  . albuterol (PROVENTIL HFA;VENTOLIN HFA) 108 (90 BASE) MCG/ACT inhaler Inhale 2 puffs into the lungs every 6 (six) hours as needed for wheezing or shortness of breath. For shortness of breath.    . cetirizine (ZYRTEC ALLERGY) 10 MG tablet Take 1 tablet (10 mg total) by mouth daily. 30 tablet 1  . clotrimazole (LOTRIMIN) 1 % external solution Apply 1 drop at bedtime in between toes 60 mL 0  . Crisaborole (EUCRISA) 2 % OINT Apply topically.    Marland Kitchen doxycycline (VIBRAMYCIN) 100 MG capsule Take 100 mg by mouth 2 (two) times daily.    . fluconazole (DIFLUCAN) 150 MG tablet Take one tablet on day one and second tablet on day four.    . fluticasone (FLONASE) 50 MCG/ACT nasal spray Place 2 sprays into both  nostrils daily. 16 g 1  . fluticasone-salmeterol (ADVAIR HFA) 45-21 MCG/ACT inhaler Inhale into the lungs.    . hydrOXYzine (ATARAX/VISTARIL) 25 MG tablet Take 1-2 tablets at bedtime as needed for sleep.    Marland Kitchen ibuprofen (ADVIL) 800 MG tablet Take 1 tablet (800 mg total) by mouth every 8 (eight) hours as needed. 30 tablet 0  . ketorolac (TORADOL) 10 MG tablet Take by mouth.    . metroNIDAZOLE (FLAGYL) 500 MG tablet Take 500 mg by mouth 2 (two) times daily.    . mupirocin ointment (BACTROBAN) 2 % Apply 1 application topically 2 (two) times daily. 22 g 0  . omeprazole (PRILOSEC) 40 MG capsule 5Take 1 capsule twice daily, 30 minutes before morning and evening meals.    . ondansetron (ZOFRAN-ODT) 8 MG disintegrating tablet Take by mouth.    . sertraline (ZOLOFT) 50 MG tablet Take by mouth.    . SUMAtriptan (IMITREX) 100 MG tablet Take 100 mg by mouth every 2 (two) hours as needed for migraine. May repeat in 2 hours if headache persists or recurs.    . triamcinolone cream (KENALOG) 0.5 % Apply 1 application topically 6 (six) times daily.      No current facility-administered medications on file prior to visit.    Allergies  Allergen Reactions  . Coconut Oil Swelling  . Tape Rash    Adhesive tape allergy.  Paper tape is okay.  . Benadryl [Diphenhydramine] Swelling    Tongue swelled  . Promethazine Nausea And Vomiting  . Ampicillin Rash    Did it  involve swelling of the face/tongue/throat, SOB, or low BP? Unknown Did it involve sudden or severe rash/hives, skin peeling, or any reaction on the inside of your mouth or nose? Unknown Did you need to seek medical attention at a hospital or doctor's office? No When did it last happen?9 + year If all above answers are "NO", may proceed with cephalosporin use. Pt was given ampicillin in hospital, was asleep when she reacted to it.   . Latex Rash    Objective:  General: Alert and oriented x3 in no acute distress  Dermatology: Keratotic  lesion present fourth webspace with skin lines transversing the lesion, pain is present with direct pressure to the lesion with a central nucleated core noted at 4th webspace on right, no webspace macerations, surgical scars well-healed with very minimal thickening however on the right>left there is some thickening at the proximal distal aspect of the scar no ecchymosis bilateral, all nails x 10 are well manicured.  Vascular: Dorsalis Pedis and Posterior Tibial pedal pulses 2/4, Capillary Fill Time 3 seconds, + pedal hair growth bilateral, no edema bilateral lower extremities, Temperature gradient within normal limits.  Neurology: Johney Maine sensation intact via light touch bilateral.  Musculoskeletal: Mild tenderness with palpation at the keratotic lesion/scar site on Right, muscular strength 5/5 in all groups without pain or limitation on range of motion.  History of previous fifth toe surgery.  Assessment and Plan: Problem List Items Addressed This Visit   None   Visit Diagnoses    Scar    -  Primary   Corn of toe       Pain of toe of right foot       Hammer toe, unspecified laterality          -Complete examination performed -Discussed treatment options for corn of toe and scar -Parred keratoic lesion using a 15 blade at fourth webspace -Patient opt for surgical management. Consent obtained for excision of scar and removal of bone at right 5th toe. Pre and Post op course explained. Risks, benefits, alternatives explained. No guarantees given or implied. Surgical booking slip submitted and provided patient with Surgical packet and info for [facility] -Advised patient to use scar-a-way treatment for painful scar on left meanwhile -Encouraged good supportive shoes that do not rub toes meanwhile  -Patient to return to office after surgery or sooner if condition worsens.  Landis Martins, DPM

## 2020-06-05 ENCOUNTER — Other Ambulatory Visit: Payer: Self-pay

## 2020-06-05 ENCOUNTER — Ambulatory Visit (INDEPENDENT_AMBULATORY_CARE_PROVIDER_SITE_OTHER): Payer: Commercial Managed Care - PPO | Admitting: Obstetrics and Gynecology

## 2020-06-05 ENCOUNTER — Encounter: Payer: Self-pay | Admitting: Obstetrics

## 2020-06-05 ENCOUNTER — Encounter: Payer: Self-pay | Admitting: Obstetrics and Gynecology

## 2020-06-05 DIAGNOSIS — N83201 Unspecified ovarian cyst, right side: Secondary | ICD-10-CM | POA: Diagnosis not present

## 2020-06-05 NOTE — Progress Notes (Signed)
New GYN presents for Cyst on Ovary, see Care Everywhere for [place].  Did Hysterectomy 2020.

## 2020-06-05 NOTE — Progress Notes (Signed)
Patient ID: Diane Garrison, female   DOB: 1985/11/02, 35 y.o.   MRN: [id number] Ms Roediger presents for eval of ovarian cysts. She in ER 05/21/2020 for pelvic pain. U/S revealed small bilateral ovarian cysts S/P TVH/BS 08/2018 Not sexual active Denies any bowel or bladder dysfunction  PE AF VSS Lungs clear Heart RRR Abd soft + BS  A/P Ovarian cysts  Reviewed with pt. Information provided as well. Will check U/S. F/U per U/S results

## 2020-06-05 NOTE — Patient Instructions (Signed)
Ovarian Cyst  An ovarian cyst is a fluid-filled sac on an ovary. Most of these cysts go away on their own and are not cancer. Some cysts need treatment. What are the causes?  Ovarian hyperstimulation syndrome. Some medicines may lead to this problem.  Polycystic ovarian syndrome (PCOS). Problems with body chemicals (hormones) can lead to this condition.  The normal menstrual cycle. What increases the risk?  Being overweight or very overweight.  Taking medicines to increase your chance of getting pregnant.  Using some types of birth control.  Smoking. What are the signs or symptoms? Many ovarian cysts do not cause symptoms. If you get symptoms, you may have:  Pain or pressure in the area between the hip bones.  Pain in the lower belly.  Pain during sex.  Swelling in the lower belly.  Periods that are not regular.  Pain with periods. How is this treated? Many ovarian cysts go away on their own without treatment. If you need treatment, it may include:  Medicines for pain.  Fluid taken out of the cyst.  The cyst being taken out.  Birth control pills or other medicines.  Surgery to remove the ovary. Follow these instructions at home:  Take over-the-counter and prescription medicines only as told by your doctor.  Ask your doctor if you should avoid driving or using machines while you are taking your medicine.  Get exams and Pap tests as told by your doctor.  Return to your normal activities when your doctor says that it is safe.  Do not smoke or use any products that contain nicotine or tobacco. If you need help quitting, ask your doctor.  Keep all follow-up visits. Contact a doctor if:  Your periods: ? Are late. ? Are not regular. ? Stop. ? Are painful.  You have pain in the area between your hip bones, and the pain does not go away.  You feel pressure on your bladder.  You have trouble peeing.  You feel full, or your belly hurts, swells, or  bloats.  You gain or lose weight without trying, or you are less hungry than normal.  You feel pain and pressure in your back.  You feel pain and pressure in the area between your hip bones.  You think you may be pregnant. Get help right away if:  You have pain in your belly that is very bad or gets worse.  You have pain in the area between your hip bones, and the pain is very bad or gets worse.  You cannot eat or drink without vomiting.  You get a fever or chills all of a sudden.  Your period is a lot heavier than usual. Summary  An ovarian cyst is a fluid-filled sac on an ovary.  Some cysts may cause problems and need treatment.  Most of these cysts go away on their own. This information is not intended to replace advice given to you by your health care provider. Make sure you discuss any questions you have with your health care provider. Document Revised: 07/27/2019 Document Reviewed: 07/27/2019 Elsevier Patient Education  2021 Elsevier Inc.  

## 2020-06-06 ENCOUNTER — Telehealth: Payer: Self-pay

## 2020-06-06 NOTE — Telephone Encounter (Signed)
DOS 06/10/2020  HAMMERTOE REPAIR 5TH RT - 28285 EXCISE SCAR RT - 15002  [facility] - PER ELLEN NO PRIOR AUTH REQUIRED CALL REF #[id number]  [employer] MEDICAID-    NOTIFICATION/PRIOR AUTHORIZATION NUMBER CASE STATUS CASE STATUS REASON PRIMARY CARE PHYSICIAN [id number] Closed Case Was Managed And Is Now Complete Hayden Pedro ADVANCE NOTIFY DATE/TIME ADMISSION NOTIFY [facility] 06/03/2020 08:49 AM CDT - COVERAGE STATUS OVERALL COVERAGE STATUS Covered/Approved 1-2 CODE DESCRIPTION COVERAGE STATUS DECISION DATE FAC [facility] Coverage determination is reflected for the facility admission and is not a guarantee of payment for ongoing services. Covered/Approved 06/06/2020 1 15002 Surgical preparation or creation of reci more Covered/Approved 06/06/2020 2 28285 Correction, hammertoe (eg, interphalange more Covered/Approved 06/06/2020

## 2020-06-09 ENCOUNTER — Other Ambulatory Visit: Payer: Self-pay | Admitting: Sports Medicine

## 2020-06-09 DIAGNOSIS — Z9889 Other specified postprocedural states: Secondary | ICD-10-CM

## 2020-06-09 NOTE — Progress Notes (Signed)
Post op meds entered 

## 2020-06-10 ENCOUNTER — Encounter: Payer: Self-pay | Admitting: Sports Medicine

## 2020-06-10 DIAGNOSIS — L905 Scar conditions and fibrosis of skin: Secondary | ICD-10-CM | POA: Diagnosis not present

## 2020-06-10 DIAGNOSIS — M2041 Other hammer toe(s) (acquired), right foot: Secondary | ICD-10-CM | POA: Diagnosis not present

## 2020-06-10 MED ORDER — IBUPROFEN 800 MG PO TABS: 800.0000 mg | ORAL_TABLET | Freq: Three times a day (TID) | ORAL | 0 refills | Status: DC | PRN

## 2020-06-10 MED ORDER — ONDANSETRON HCL 4 MG PO TABS: 4.0000 mg | ORAL_TABLET | Freq: Three times a day (TID) | ORAL | 0 refills | Status: DC | PRN

## 2020-06-10 MED ORDER — HYDROCODONE-ACETAMINOPHEN 5-325 MG PO TABS: 1.0000 | ORAL_TABLET | Freq: Four times a day (QID) | ORAL | 0 refills | Status: AC | PRN

## 2020-06-10 MED ORDER — DOCUSATE SODIUM 100 MG PO CAPS: 100.0000 mg | ORAL_CAPSULE | Freq: Two times a day (BID) | ORAL | 0 refills | Status: DC

## 2020-06-11 ENCOUNTER — Telehealth: Payer: Self-pay | Admitting: Sports Medicine

## 2020-06-11 NOTE — Telephone Encounter (Signed)
Post op check phone call made to patient.  Patient reports that she is doing good.  Denies any issues with being able to pick up her medications as prescribed on yesterday.  Patient was reminded to continue with rest ice elevation and to wear surgical shoe upon ambulation however may take shoe off at bedtime as long as she props her foot up on a pillow and does not get her foot caught underneath the covers.  I reminded patient that she has a follow-up in office on next week for postoperative check.  I encourage patient to call office if there are any other postoperative questions or concerns.  Patient thanked me for calling. -Dr. Cannon Kettle

## 2020-06-17 NOTE — Therapy (Signed)
[facility] [address] [place], [place], [address] Phone: [phone]   Fax:  [phone]  Patient Details  Name: Diane Garrison MRN: [id number] Date of Birth: Sep 07, 1985 Referring Provider:  Jolene Provost, PA-C  Encounter Date: 06/17/2020   SPEECH THERAPY DISCHARGE SUMMARY  Visits from Start of Care: 5  Current functional level related to goals / functional outcomes: Merary presented with improvements in vocal quality; however, pt did not return for remaining ST intervention despite contact via telephone. Pt was still hoarse, however harsh, strangled voice with pitch breaks had resolved. Christie was using semiocculded vocal tract exercises (SOVTE) and flow phonation phrases/sentences to recalibrate her voice when she notes harsh, strangled voice. 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 She had eliminated throat clearing behavior. No further progress indicated as pt did not return for remainder of ST sessions.     Remaining deficits: Harsh vocal attack, limited vocal rest   Education / Equipment: SOVTE, throat clear alternatives, flow phonation, vocal hygiene   Plan: Patient agrees to discharge.  Patient goals were partially met. Patient is being discharged due to not returning since the last visit.  ?????         SLP Short Term Goals - 05/01/20 1148              SLP SHORT TERM GOAL #1    Title Pt will report following 3 aspects of vocal hygiene/conservation over 3 therapy sessions     Baseline not following vocal hygiene     Time 2     Period Weeks     Status Achieved          SLP SHORT TERM GOAL #2    Title Pt will perform HEP for voice with rare min A     Baseline no voice function exercise program     Time 2     Period Weeks     Status Achieved          SLP SHORT TERM GOAL #3    Title Pt will demonstrate clear phonation over  5 minute conversation with rare min A     Baseline consistently hoarse     Time 2     Period Weeks     Status On-going                  SLP Long Term Goals - 05/01/20 1148              SLP LONG TERM GOAL #1    Title Pt will report following 5 aspects of vocal hygiene/conservation program over 3 sessions     Baseline no voice hygiene strategies     Time 6     Period Weeks     Status On-going          SLP LONG TERM GOAL #2    Title Pt will improve Voice Related Quality of Life (V-RQOL) score by 5 points     Baseline calculated score is 27.5     Time 6     Period Weeks     Status On-going          SLP LONG TERM GOAL #3    Title Pt will maintain clear phonation and vocal endurance over 15 minute conversation with rare min A     Baseline consistently hoarse     Time 6     Period Weeks  Status On-going          SLP LONG TERM GOAL #4    Title Perform HEP for voice with mod I     Baseline no vocal exercise program     Time 6     Period Weeks     Status On-going          SLP LONG TERM GOAL #5    Title Pt will report no missed shifts due to being called off for hoarse voice     Baseline Pt has been called off 3x, including for 10 days     Time 7     Period Weeks     Status On-going              Alinda Deem, [place] CCC-SLP 06/17/2020, 12:27 PM  [facility] [address] [place], [place], [address] Phone: [phone]   Fax:  [phone]

## 2020-06-20 ENCOUNTER — Ambulatory Visit (INDEPENDENT_AMBULATORY_CARE_PROVIDER_SITE_OTHER): Payer: Commercial Managed Care - PPO | Admitting: Podiatry

## 2020-06-20 ENCOUNTER — Other Ambulatory Visit: Payer: Self-pay

## 2020-06-20 ENCOUNTER — Ambulatory Visit (INDEPENDENT_AMBULATORY_CARE_PROVIDER_SITE_OTHER): Payer: Commercial Managed Care - PPO

## 2020-06-20 DIAGNOSIS — Z9889 Other specified postprocedural states: Secondary | ICD-10-CM

## 2020-06-20 DIAGNOSIS — M2041 Other hammer toe(s) (acquired), right foot: Secondary | ICD-10-CM

## 2020-06-23 NOTE — Progress Notes (Signed)
  Subjective:  Patient ID: Diane Garrison, female    DOB: 30-May-1985,  MRN: [id number]  Chief Complaint  Patient presents with  . Routine Post Op     POV #1 DOS 06/10/2020 RT 5TH TOE REMOVAL OF BONE & CUT/EXCISE SCAR TISSUE AT RT 4TH WEBSPACE    DOS: 06/10/2020 Procedure: Partial syndactylization and fifth toe arthroplasty  35 y.o. female returns for post-op check.  Doing well  Review of Systems: Negative except as noted in the HPI. Denies N/V/F/Ch.   Objective:  There were no vitals filed for this visit. There is no height or weight on file to calculate BMI. Constitutional Well developed. Well nourished.  Vascular Foot warm and well perfused. Capillary refill normal to all digits.   Neurologic Normal speech. Oriented to person, place, and time. Epicritic sensation to light touch grossly present bilaterally.  Dermatologic Skin healing well without signs of infection. Skin edges well coapted without signs of infection.  Orthopedic: Tenderness to palpation noted about the surgical site.   Radiographs: Status post fifth toe arthroplasty Assessment:   1. S/P foot surgery, right   2. Hammertoe of right foot   3. Post-operative state    Plan:  Patient was evaluated and treated and all questions answered.  S/p foot surgery right -Progressing as expected post-operatively. -XR: As above -WB Status: WBAT in regular shoes when tolerated -Sutures: Should be able to remove next week.  May begin bathing  -Medications: No refills required -Foot redressed.  No follow-ups on file.

## 2020-06-24 ENCOUNTER — Telehealth: Payer: Self-pay | Admitting: Sports Medicine

## 2020-06-24 NOTE — Telephone Encounter (Signed)
Patient called and stated Dr. Sherryle Lis unwrapped her foot and stated that she could get her foot wet. She noticed while she was showering that some of her stiches came out, is that normal? Please call patient.

## 2020-06-27 ENCOUNTER — Other Ambulatory Visit: Payer: Self-pay

## 2020-06-27 ENCOUNTER — Encounter: Payer: Self-pay | Admitting: Sports Medicine

## 2020-06-27 ENCOUNTER — Ambulatory Visit (INDEPENDENT_AMBULATORY_CARE_PROVIDER_SITE_OTHER): Payer: Commercial Managed Care - PPO | Admitting: Sports Medicine

## 2020-06-27 DIAGNOSIS — Z9889 Other specified postprocedural states: Secondary | ICD-10-CM

## 2020-06-27 DIAGNOSIS — M2041 Other hammer toe(s) (acquired), right foot: Secondary | ICD-10-CM

## 2020-06-27 DIAGNOSIS — L905 Scar conditions and fibrosis of skin: Secondary | ICD-10-CM

## 2020-06-27 DIAGNOSIS — L84 Corns and callosities: Secondary | ICD-10-CM

## 2020-06-27 NOTE — Progress Notes (Signed)
Subjective: Diane Garrison is a 35 y.o. female patient seen today in office for POV #2 (DOS 06-10-20), S/P right 5th toe removal of pain and excision of scar. Patient admits a little pain at surgical site and reports that some sutures came loose when bathing, denies calf pain, denies headache, chest pain, shortness of breath, nausea, vomiting, fever, or chills.  Patient Active Problem List   Diagnosis Date Noted  . Cyst of ovary, right 06/05/2020  . Depression, major, recurrent, moderate ([place]) 04/09/2020  . Psychophysiological insomnia 04/09/2020  . Hoarseness 12/19/2019  . Laryngopharyngeal reflux (LPR) 12/19/2019  . Abnormal auditory perception of both ears 10/15/2017  . Bilateral temporomandibular joint pain 10/15/2017  . Moderate persistent asthma without complication 36/14/4315  . Tobacco use 08/02/2014  . Allergic rhinitis 07/13/2012  . Headache, menstrual migraine 07/13/2012  . Vitamin D deficiency 01/05/2010  . Hypertrophy of breast 02/04/2009  . Mastodynia 02/04/2009  . Contact dermatitis and other eczema, due to unspecified cause 07/19/2008  . Herpes simplex virus (HSV) infection 07/19/2008    Current Outpatient Medications on File Prior to Visit  Medication Sig Dispense Refill  . albuterol (PROVENTIL HFA;VENTOLIN HFA) 108 (90 BASE) MCG/ACT inhaler Inhale 2 puffs into the lungs every 6 (six) hours as needed for wheezing or shortness of breath. For shortness of breath.    . cetirizine (ZYRTEC ALLERGY) 10 MG tablet Take 1 tablet (10 mg total) by mouth daily. 30 tablet 1  . clotrimazole (LOTRIMIN) 1 % external solution Apply 1 drop at bedtime in between toes 60 mL 0  . Crisaborole (EUCRISA) 2 % OINT Apply topically. (Patient not taking: Reported on 06/05/2020)    . docusate sodium (COLACE) 100 MG capsule Take 1 capsule (100 mg total) by mouth 2 (two) times daily. 10 capsule 0  . doxycycline (VIBRAMYCIN) 100 MG capsule Take 100 mg by mouth 2 (two) times daily. (Patient not  taking: Reported on 06/05/2020)    . fluconazole (DIFLUCAN) 150 MG tablet Take one tablet on day one and second tablet on day four. (Patient not taking: Reported on 06/05/2020)    . fluticasone (FLONASE) 50 MCG/ACT nasal spray Place 2 sprays into both nostrils daily. 16 g 1  . fluticasone-salmeterol (ADVAIR HFA) 45-21 MCG/ACT inhaler Inhale into the lungs.    . hydrOXYzine (ATARAX/VISTARIL) 25 MG tablet Take 1-2 tablets at bedtime as needed for sleep. (Patient not taking: Reported on 06/05/2020)    . ibuprofen (ADVIL) 800 MG tablet Take 1 tablet (800 mg total) by mouth every 8 (eight) hours as needed. 30 tablet 0  . ketorolac (TORADOL) 10 MG tablet Take by mouth.    . metroNIDAZOLE (FLAGYL) 500 MG tablet Take 500 mg by mouth 2 (two) times daily. (Patient not taking: Reported on 06/05/2020)    . mupirocin ointment (BACTROBAN) 2 % Apply 1 application topically 2 (two) times daily. (Patient not taking: Reported on 06/05/2020) 22 g 0  . omeprazole (PRILOSEC) 40 MG capsule 5Take 1 capsule twice daily, 30 minutes before morning and evening meals.    . ondansetron (ZOFRAN) 4 MG tablet Take 1 tablet (4 mg total) by mouth every 8 (eight) hours as needed for nausea or vomiting. 20 tablet 0  . ondansetron (ZOFRAN-ODT) 8 MG disintegrating tablet Take by mouth.    . sertraline (ZOLOFT) 50 MG tablet Take by mouth.    . SUMAtriptan (IMITREX) 100 MG tablet Take 100 mg by mouth every 2 (two) hours as needed for migraine. May repeat in 2 hours  if headache persists or recurs.    . triamcinolone cream (KENALOG) 0.5 % Apply 1 application topically 6 (six) times daily.      No current facility-administered medications on file prior to visit.    Allergies  Allergen Reactions  . Coconut Oil Swelling  . Tape Rash    Adhesive tape allergy.  Paper tape is okay.  . Benadryl [Diphenhydramine] Swelling    Tongue swelled  . Promethazine Nausea And Vomiting  . Ampicillin Rash    Did it involve swelling of the face/tongue/throat,  SOB, or low BP? Unknown Did it involve sudden or severe rash/hives, skin peeling, or any reaction on the inside of your mouth or nose? Unknown Did you need to seek medical attention at a hospital or doctor's office? No When did it last happen?9 + year If all above answers are "NO", may proceed with cephalosporin use. Pt was given ampicillin in hospital, was asleep when she reacted to it.   . Latex Rash    Objective: There were no vitals filed for this visit.  General: No acute distress, AAOx3  Right: Sutures intact with no gapping or dehiscence at surgical site, here's a little interdigital maceration from showering, mild swelling to right 5th toe, no erythema, no warmth, no drainage, no acute signs of infection noted, Capillary fill time <3 seconds in all digits, gross sensation present via light touch to right, No pain with calf compression.   Assessment and Plan:  Problem List Items Addressed This Visit   None   Visit Diagnoses    S/P foot surgery, right    -  Primary   Hammertoe of right foot       Post-operative state       Scar       Corn of toe           -Patient seen and evaluated -Few sutures were removed will finish the remaider at next visit  -Applied betadine dry sterile dressing to surgical site right -Advised patient to refrain from submersion of foot in water until the remaining sutures are removed and to continue to apply betadine to dry the macerated area in between the toes -Advised patient to continue with post-op shoe or open toe sandal that may not rub the toe -Advised patient to limit activity to necessity  -Advised patient to ice and elevate as necessary  -May resume in person work on Monday -Will plan for finishing suture removal at next office visit. In the meantime, patient to call office if any issues or problems arise.   Landis Martins, DPM

## 2020-07-04 ENCOUNTER — Telehealth: Payer: Self-pay | Admitting: Sports Medicine

## 2020-07-04 NOTE — Telephone Encounter (Signed)
Called patient to schedule for 1130, patient stated they wouldn't be able to make it, "assumed she wasn't going to see me so Ill just have to wait until the 19th then"

## 2020-07-04 NOTE — Telephone Encounter (Signed)
Patient said NO, currently on the phone now. Patient stated she would remove herself cause you are her doctor and refuse to keep seeing other providers. "You are my doctor and keep sending me to other providers that keep messing with healing"

## 2020-07-04 NOTE — Telephone Encounter (Signed)
Patient doesn't want to see anyone but Diane Garrison due to an previous experience with another provider with [facility] per patient

## 2020-07-04 NOTE — Telephone Encounter (Signed)
The following patient called inquiring about appointment for today. Stated she was told that she would be scheduled to come in for removal of stiches. Patient currently isn't scheduled, Please Advise

## 2020-07-18 ENCOUNTER — Encounter: Payer: Self-pay | Admitting: Sports Medicine

## 2020-07-18 ENCOUNTER — Other Ambulatory Visit: Payer: Self-pay

## 2020-07-18 ENCOUNTER — Ambulatory Visit (INDEPENDENT_AMBULATORY_CARE_PROVIDER_SITE_OTHER): Payer: Commercial Managed Care - PPO | Admitting: Sports Medicine

## 2020-07-18 DIAGNOSIS — M2041 Other hammer toe(s) (acquired), right foot: Secondary | ICD-10-CM

## 2020-07-18 DIAGNOSIS — Z9889 Other specified postprocedural states: Secondary | ICD-10-CM

## 2020-07-18 NOTE — Progress Notes (Signed)
Subjective: Diane Garrison is a 35 y.o. female patient seen today in office for POV #3 (DOS 06-10-20), S/P right 5th toe removal of pain and excision of scar. Patient admits a little pain at the surgical site but otherwise is doing well with a little bit of swelling after being on her feet all day.  Denies any other pedal complaints or constitutional symptoms at this time.  Patient Active Problem List   Diagnosis Date Noted  . Cyst of ovary, right 06/05/2020  . Depression, major, recurrent, moderate ([place]) 04/09/2020  . Psychophysiological insomnia 04/09/2020  . Hoarseness 12/19/2019  . Laryngopharyngeal reflux (LPR) 12/19/2019  . Abnormal auditory perception of both ears 10/15/2017  . Bilateral temporomandibular joint pain 10/15/2017  . Moderate persistent asthma without complication 70/35/0093  . Tobacco use 08/02/2014  . Allergic rhinitis 07/13/2012  . Headache, menstrual migraine 07/13/2012  . Vitamin D deficiency 01/05/2010  . Hypertrophy of breast 02/04/2009  . Mastodynia 02/04/2009  . Contact dermatitis and other eczema, due to unspecified cause 07/19/2008  . Herpes simplex virus (HSV) infection 07/19/2008    Current Outpatient Medications on File Prior to Visit  Medication Sig Dispense Refill  . albuterol (PROVENTIL HFA;VENTOLIN HFA) 108 (90 BASE) MCG/ACT inhaler Inhale 2 puffs into the lungs every 6 (six) hours as needed for wheezing or shortness of breath. For shortness of breath.    . cetirizine (ZYRTEC ALLERGY) 10 MG tablet Take 1 tablet (10 mg total) by mouth daily. 30 tablet 1  . clotrimazole (LOTRIMIN) 1 % external solution Apply 1 drop at bedtime in between toes 60 mL 0  . Crisaborole (EUCRISA) 2 % OINT Apply topically.    . docusate sodium (COLACE) 100 MG capsule Take 1 capsule (100 mg total) by mouth 2 (two) times daily. 10 capsule 0  . doxycycline (VIBRAMYCIN) 100 MG capsule Take 100 mg by mouth 2 (two) times daily. (Patient not taking: Reported on 06/05/2020)     . fluconazole (DIFLUCAN) 150 MG tablet Take one tablet on day one and second tablet on day four. (Patient not taking: Reported on 06/05/2020)    . fluticasone (FLONASE) 50 MCG/ACT nasal spray Place 2 sprays into both nostrils daily. 16 g 1  . fluticasone-salmeterol (ADVAIR HFA) 45-21 MCG/ACT inhaler Inhale into the lungs.    . hydrOXYzine (ATARAX/VISTARIL) 25 MG tablet     . ibuprofen (ADVIL) 800 MG tablet Take 1 tablet (800 mg total) by mouth every 8 (eight) hours as needed. 30 tablet 0  . ketorolac (TORADOL) 10 MG tablet Take by mouth.    . metroNIDAZOLE (FLAGYL) 500 MG tablet Take 500 mg by mouth 2 (two) times daily. (Patient not taking: Reported on 06/05/2020)    . mupirocin ointment (BACTROBAN) 2 % Apply 1 application topically 2 (two) times daily. 22 g 0  . omeprazole (PRILOSEC) 40 MG capsule 5Take 1 capsule twice daily, 30 minutes before morning and evening meals.    . ondansetron (ZOFRAN) 4 MG tablet Take 1 tablet (4 mg total) by mouth every 8 (eight) hours as needed for nausea or vomiting. 20 tablet 0  . ondansetron (ZOFRAN-ODT) 8 MG disintegrating tablet Take by mouth.    . sertraline (ZOLOFT) 50 MG tablet Take by mouth.    . SUMAtriptan (IMITREX) 100 MG tablet Take 100 mg by mouth every 2 (two) hours as needed for migraine. May repeat in 2 hours if headache persists or recurs.    . triamcinolone cream (KENALOG) 0.5 % Apply 1 application topically 6 (  six) times daily.     Marland Kitchen triamcinolone ointment (KENALOG) 0.5 % SMARTSIG:1 Topical Every Night     No current facility-administered medications on file prior to visit.    Allergies  Allergen Reactions  . Coconut Oil Swelling  . Tape Rash    Adhesive tape allergy.  Paper tape is okay.  . Benadryl [Diphenhydramine] Swelling    Tongue swelled  . Promethazine Nausea And Vomiting  . Ampicillin Rash    Did it involve swelling of the face/tongue/throat, SOB, or low BP? Unknown Did it involve sudden or severe rash/hives, skin peeling, or any  reaction on the inside of your mouth or nose? Unknown Did you need to seek medical attention at a hospital or doctor's office? No When did it last happen?9 + year If all above answers are "NO", may proceed with cephalosporin use. Pt was given ampicillin in hospital, was asleep when she reacted to it.   . Latex Rash    Objective: There were no vitals filed for this visit.  General: No acute distress, AAOx3  Right: Sutures intact with no gapping or dehiscence at surgical site, no interdigital maceration, mild swelling to right 5th toe, no erythema, no warmth, no drainage, no acute signs of infection noted, Capillary fill time <3 seconds in all digits, gross sensation present via light touch to right, No pain with calf compression.   Assessment and Plan:  Problem List Items Addressed This Visit   None   Visit Diagnoses    S/P foot surgery, right    -  Primary   Hammertoe of right foot       Post-operative state           -Patient seen and evaluated -Remaining sutures removed -May shower and get the foot wet -Advised patient to limit activity to tolerance and normal shoe as tolerated -Continue with desk duty at work with elevation as needed -Will plan for postoperative x-rays at next office visit. In the meantime, patient to call office if any issues or problems arise.   Landis Martins, DPM

## 2020-07-31 ENCOUNTER — Ambulatory Visit: Admission: RE | Admit: 2020-07-31 | Payer: Commercial Managed Care - PPO | Source: Ambulatory Visit

## 2020-08-22 ENCOUNTER — Ambulatory Visit: Payer: Commercial Managed Care - PPO | Admitting: Sports Medicine

## 2020-09-09 ENCOUNTER — Emergency Department (HOSPITAL_BASED_OUTPATIENT_CLINIC_OR_DEPARTMENT_OTHER)
Admission: EM | Admit: 2020-09-09 | Discharge: 2020-09-09 | Disposition: A | Discharge status: Home / Self Care | Payer: Commercial Managed Care - PPO | Attending: Emergency Medicine | Admitting: Emergency Medicine

## 2020-09-09 ENCOUNTER — Encounter (HOSPITAL_BASED_OUTPATIENT_CLINIC_OR_DEPARTMENT_OTHER): Payer: Self-pay | Admitting: Emergency Medicine

## 2020-09-09 ENCOUNTER — Other Ambulatory Visit: Payer: Self-pay

## 2020-09-09 DIAGNOSIS — Z5321 Procedure and treatment not carried out due to patient leaving prior to being seen by health care provider: Secondary | ICD-10-CM | POA: Insufficient documentation

## 2020-09-09 DIAGNOSIS — N764 Abscess of vulva: Secondary | ICD-10-CM | POA: Insufficient documentation

## 2020-09-09 NOTE — ED Triage Notes (Signed)
Pt arrives to ED with c/o of abscess to vaginal area. Pt with remote hx of Bartholin's Abscess. No fevers or chills.

## 2020-09-10 ENCOUNTER — Encounter (HOSPITAL_COMMUNITY): Payer: Self-pay | Admitting: Emergency Medicine

## 2020-09-10 ENCOUNTER — Ambulatory Visit (HOSPITAL_COMMUNITY)
Admission: EM | Admit: 2020-09-10 | Discharge: 2020-09-10 | Disposition: A | Discharge status: Home / Self Care | Payer: Commercial Managed Care - PPO | Attending: Family Medicine | Admitting: Family Medicine

## 2020-09-10 DIAGNOSIS — L02214 Cutaneous abscess of groin: Secondary | ICD-10-CM

## 2020-09-10 HISTORY — DX: Hidradenitis suppurativa: L73.2

## 2020-09-10 MED ORDER — DOXYCYCLINE HYCLATE 100 MG PO CAPS: 100.0000 mg | ORAL_CAPSULE | Freq: Two times a day (BID) | ORAL | 0 refills | Status: DC

## 2020-09-10 MED ORDER — LIDOCAINE HCL (PF) 1 % IJ SOLN
INTRAMUSCULAR | Status: AC
  Filled 2020-09-10: qty 30

## 2020-09-10 NOTE — ED Triage Notes (Addendum)
Pt is present with a lump near her anus. Pt denies any drainage or bleeding. Pt states that her sx started last Wednesday.

## 2020-09-10 NOTE — ED Provider Notes (Signed)
[facility]    CSN: [id number] Arrival date & time: 09/10/20  0109      History   Chief Complaint Chief Complaint  Patient presents with   skin lesion    HPI Diane Garrison is a 35 y.o. female.   Patient presenting today with 5 to 6-day history of acutely worsening left labial/buttock region abscess.  States she is trying to warm soaks in the tub, which hazel, Hibiclens to the area with no relief in the area is acutely worsening the past 24 hours.  Denies drainage from the area, fevers, chills, nausea, vomiting.  Has not been with her OB/GYN for this issue but this is not until Thursday.  History of hidradenitis with frequent flares in this region.   Past Medical History:  Diagnosis Date   Allergy    Anemia    Anxiety    Asthma    Bipolar disorder ([place])    Bipolar 2   Cigarette smoker    Depression    Hidradenitis suppurativa    Insomnia    Migraine    Panic attacks     Patient Active Problem List   Diagnosis Date Noted   Cyst of ovary, right 06/05/2020   Depression, major, recurrent, moderate ([place]) 04/09/2020   Psychophysiological insomnia 04/09/2020   Hoarseness 12/19/2019   Laryngopharyngeal reflux (LPR) 12/19/2019   Abnormal auditory perception of both ears 10/15/2017   Bilateral temporomandibular joint pain 10/15/2017   Moderate persistent asthma without complication 32/35/5732   Tobacco use 08/02/2014   Allergic rhinitis 07/13/2012   Headache, menstrual migraine 07/13/2012   Vitamin D deficiency 01/05/2010   Hypertrophy of breast 02/04/2009   Mastodynia 02/04/2009   Contact dermatitis and other eczema, due to unspecified cause 07/19/2008   Herpes simplex virus (HSV) infection 07/19/2008    Past Surgical History:  Procedure Laterality Date   CESAREAN SECTION  2004,2006,2007,2008   x 4   CHOLECYSTECTOMY     DILITATION & CURRETTAGE/HYSTROSCOPY WITH HYDROTHERMAL ABLATION N/A 03/16/2014   Procedure: DILATATION &  CURETTAGE/HYSTEROSCOPY WITH HYDROTHERMAL ABLATION;  Surgeon: Shelly Bombard, MD;  Location: [facility];  Service: Gynecology;  Laterality: N/A;   FOOT SURGERY  2018, 04/2018    right foot 2018, left foot 2020   TUBAL LIGATION  03/20/2007   VAGINAL HYSTERECTOMY Bilateral 08/30/2018   Procedure: HYSTERECTOMY VAGINAL WITH SALPINGECTOMY;  Surgeon: Chancy Milroy, MD;  Location: [facility];  Service: Gynecology;  Laterality: Bilateral;   WISDOM TOOTH EXTRACTION      OB History     Gravida  4   Para  4   Term  4   Preterm      AB      Living  4      SAB      IAB      Ectopic      Multiple      Live Births  4          Home Medications    Prior to Admission medications   Medication Sig Start Date End Date Taking? Authorizing Provider  albuterol (PROVENTIL HFA;VENTOLIN HFA) 108 (90 BASE) MCG/ACT inhaler Inhale 2 puffs into the lungs every 6 (six) hours as needed for wheezing or shortness of breath. For shortness of breath.    [provider]  cetirizine (ZYRTEC ALLERGY) 10 MG tablet Take 1 tablet (10 mg total) by mouth daily. 09/26/14   Waynetta Pean, PA-C  clotrimazole (LOTRIMIN) 1 % external solution Apply 1 drop  at bedtime in between toes 05/02/20   Stover, [place], DPM  Crisaborole (EUCRISA) 2 % OINT Apply topically. 06/30/19   [provider]  docusate sodium (COLACE) 100 MG capsule Take 1 capsule (100 mg total) by mouth 2 (two) times daily. 06/10/20   Landis Martins, DPM  doxycycline (VIBRAMYCIN) 100 MG capsule Take 1 capsule (100 mg total) by mouth 2 (two) times daily. 09/10/20   Volney American, PA-C  fluconazole (DIFLUCAN) 150 MG tablet Take one tablet on day one and second tablet on day four. Patient not taking: Reported on 06/05/2020 12/19/19   [provider]  fluticasone (FLONASE) 50 MCG/ACT nasal spray Place 2 sprays into both nostrils daily. 09/26/14   Waynetta Pean, PA-C  fluticasone-salmeterol (ADVAIR HFA) [phone] MCG/ACT inhaler Inhale  into the lungs. 02/16/20   [provider]  hydrOXYzine (ATARAX/VISTARIL) 25 MG tablet  04/09/20   [provider]  ibuprofen (ADVIL) 800 MG tablet Take 1 tablet (800 mg total) by mouth every 8 (eight) hours as needed. 06/10/20   Landis Martins, DPM  ketorolac (TORADOL) 10 MG tablet Take by mouth. 05/22/20   [provider]  metroNIDAZOLE (FLAGYL) 500 MG tablet Take 500 mg by mouth 2 (two) times daily. Patient not taking: Reported on 06/05/2020 05/22/20   [provider]  mupirocin ointment (BACTROBAN) 2 % Apply 1 application topically 2 (two) times daily. 08/27/18   Tasia Catchings, Amy V, PA-C  omeprazole (PRILOSEC) 40 MG capsule 5Take 1 capsule twice daily, 30 minutes before morning and evening meals. 12/19/19   [provider]  ondansetron (ZOFRAN) 4 MG tablet Take 1 tablet (4 mg total) by mouth every 8 (eight) hours as needed for nausea or vomiting. 06/10/20   Landis Martins, DPM  ondansetron (ZOFRAN-ODT) 8 MG disintegrating tablet Take by mouth. 05/22/20   [provider]  sertraline (ZOLOFT) 50 MG tablet Take by mouth. 06/29/19   [provider]  SUMAtriptan (IMITREX) 100 MG tablet Take 100 mg by mouth every 2 (two) hours as needed for migraine. May repeat in 2 hours if headache persists or recurs.    [provider]  triamcinolone cream (KENALOG) 0.5 % Apply 1 application topically 6 (six) times daily.  09/22/17   [provider]  triamcinolone ointment (KENALOG) 0.5 % SMARTSIG:1 Topical Every Night 06/09/20   [provider]   Family History Family History  Problem Relation Age of Onset   Heart disease Mother    Heart disease Maternal Grandmother    Social History Social History   Tobacco Use   Smoking status: Former    Packs/day: 0.50    Years: 13.00    Pack years: 6.50    Types: Cigarettes    Quit date: 2017    Years since quitting: 5.5   Smokeless tobacco: Never  Vaping Use   Vaping Use: Never used   Substance Use Topics   Alcohol use: No    Alcohol/week: 0.0 standard drinks   Drug use: Yes    Types: Marijuana    Comment: LAST USE SUN 08/28/18     Allergies   Coconut oil, Tape, Benadryl [diphenhydramine], Promethazine, Ampicillin, and Latex   Review of Systems Review of Systems Per HPI  Physical Exam Triage Vital Signs ED Triage Vitals  Enc Vitals Group     BP 09/10/20 0831 112/62     Pulse Rate 09/10/20 0831 72     Resp 09/10/20 0831 18     Temp 09/10/20 0831 98.8 F (37.1 C)  Temp Source 09/10/20 0831 Oral     SpO2 09/10/20 0831 98 %     Weight --      Height --      Head Circumference --      Peak Flow --      Pain Score 09/10/20 0829 8     Pain Loc --      Pain Edu? --      Excl. in [place]? --    No data found.  Updated Vital Signs BP 112/62 (BP Location: Right Arm)   Pulse 72   Temp 98.8 F (37.1 C) (Oral)   Resp 18   LMP  (LMP Unknown)   SpO2 98%   Visual Acuity Right Eye Distance:   Left Eye Distance:   Bilateral Distance:    Right Eye Near:   Left Eye Near:    Bilateral Near:     Physical Exam Vitals and nursing note reviewed.  Constitutional:      Appearance: Normal appearance. She is not ill-appearing.  HENT:     Head: Atraumatic.  Eyes:     Extraocular Movements: Extraocular movements intact.     Conjunctiva/sclera: Conjunctivae normal.  Cardiovascular:     Rate and Rhythm: Normal rate and regular rhythm.     Heart sounds: Normal heart sounds.  Pulmonary:     Effort: Pulmonary effort is normal.     Breath sounds: Normal breath sounds.  Musculoskeletal:        General: Normal range of motion.     Cervical back: Normal range of motion and neck supple.  Skin:    General: Skin is warm and dry.     Comments: 3 cm fluctuant, erythematous and indurated abscess present base of left labia extending into buttock and groin region.  Significantly tender to palpation  Neurological:     Mental Status: She is alert and oriented to person,  place, and time.  Psychiatric:        Mood and Affect: Mood normal.        Thought Content: Thought content normal.        Judgment: Judgment normal.     UC Treatments / Results  Labs (all labs ordered are listed, but only abnormal results are displayed) Labs Reviewed - No data to display  EKG   Radiology No results found.  Procedures Incision and Drainage  Date/Time: 09/10/2020 9:43 AM Performed by: Volney American, PA-C Authorized by: Volney American, PA-C   Consent:    Consent obtained:  Verbal   Consent given by:  Patient   Risks, benefits, and alternatives were discussed: yes     Risks discussed:  Incomplete drainage, pain and infection   Alternatives discussed:  Alternative treatment Universal protocol:    Procedure explained and questions answered to patient or proxy's satisfaction: yes     Relevant documents present and verified: yes     Patient identity confirmed:  Verbally with patient Location:    Type:  Abscess   Size:  3 cm   Location:  Anogenital   Anogenital location: Left labia. Pre-procedure details:    Skin preparation:  Chlorhexidine with alcohol Sedation:    Sedation type:  None Anesthesia:    Anesthesia method:  Local infiltration   Local anesthetic:  Lidocaine 1% w/o epi Procedure type:    Complexity:  Simple Procedure details:    Ultrasound guidance: no     Needle aspiration: no     Incision types:  Stab incision  Incision depth:  Dermal   Wound management:  Probed and deloculated   Drainage:  Purulent   Drainage amount:  Copious   Wound treatment:  Wound left open   Packing materials:  None Post-procedure details:    Procedure completion:  Tolerated well, no immediate complications (including critical care time)  Medications Ordered in UC Medications - No data to display  Initial Impression / Assessment and Plan / UC Course  I have reviewed the triage vital signs and the nursing notes.  Pertinent labs & imaging  results that were available during my care of the patient were reviewed by me and considered in my medical decision making (see chart for details).     I&D performed today with copious amounts of thick yellow and brown drainage.  Patient tolerated procedure well with no immediate complications.  Discussed continued warm soaks, cleaning the area with Hibiclens, doxycycline course sent to the pharmacy.  Follow-up with OB/GYN in 2 days as scheduled for recheck of the area.  Work note given.  Final Clinical Impressions(s) / UC Diagnoses   Final diagnoses:  Groin abscess   Discharge Instructions   None    ED Prescriptions     Medication Sig Dispense Auth. Provider   doxycycline (VIBRAMYCIN) 100 MG capsule Take 1 capsule (100 mg total) by mouth 2 (two) times daily. 14 capsule Volney American, [place]      PDMP not reviewed this encounter.   Volney American, [place] 09/10/20 0945

## 2020-09-12 ENCOUNTER — Ambulatory Visit (INDEPENDENT_AMBULATORY_CARE_PROVIDER_SITE_OTHER): Payer: Commercial Managed Care - PPO | Admitting: Obstetrics and Gynecology

## 2020-09-12 ENCOUNTER — Other Ambulatory Visit: Payer: Self-pay

## 2020-09-12 ENCOUNTER — Encounter: Payer: Self-pay | Admitting: Obstetrics and Gynecology

## 2020-09-12 VITALS — BP 130/80 | HR 67 | Ht 61.5 in | Wt 126.4 lb

## 2020-09-12 DIAGNOSIS — N764 Abscess of vulva: Secondary | ICD-10-CM | POA: Diagnosis not present

## 2020-09-12 DIAGNOSIS — N83201 Unspecified ovarian cyst, right side: Secondary | ICD-10-CM

## 2020-09-12 NOTE — Progress Notes (Signed)
Follow up post I & D bartholin cyst. Patient states she wants MD to "remove my sweat glands" and "my ovaries or anything left in there." Reports still having period related symptoms post hysterectomy.

## 2020-09-12 NOTE — Progress Notes (Signed)
Diane Garrison presents for follow up of left labial abscess. S/P I & D at UC this past Tuesday. Sx started 1 week ago Still painful and uncomfortable On Vibramycin  Did not get GYN U/S  PE AF VSS Lungs clear Heart RRR Abd soft + BS GU Left labial abscess @ 1 cm x 1 cm, tender  Procedure  After informed consent was obtained, abscess area was prepped with Betadine. Anesthized with 5 cc 1 % plain Lidocaine. Small stab incision was made. Bloody, purulent discharge expressed. Hemostat used to break up loculations. Incision edges were sutured with 3/0 Vicryl to maintain, open. Pt tolerated procedure well.   A/P S/P I & D left labial abscess        Ovarian cyst  Wound care reviewed with pt. Motrin for pain. Continue with antibiotics. Work note for today. GYN U/S reordered. F/U in 7-10 days

## 2020-09-12 NOTE — Patient Instructions (Signed)
Incision and Drainage, Care After This sheet gives you information about how to care for yourself after your procedure. Your health care provider may also give you more specific instructions. If you have problems or questions, contact your health careprovider. What can I expect after the procedure? After the procedure, it is common to have: Pain or discomfort around the incision site. Blood, fluid, or pus (drainage) from the incision. Redness and firm skin around the incision site. Follow these instructions at home: Medicines Take over-the-counter and prescription medicines only as told by your health care provider. If you were prescribed an antibiotic medicine, use or take it as told by your health care provider. Do not stop using the antibiotic even if you start to feel better. Wound care Follow instructions from your health care provider about how to take care of your wound. Make sure you: Wash your hands with soap and water before and after you change your bandage (dressing). If soap and water are not available, use hand sanitizer. Change your dressing and packing as told by your health care provider. If your dressing is dry or stuck when you try to remove it, moisten or wet the dressing with saline or water so that it can be removed without harming your skin or tissues. If your wound is packed, leave it in place until your health care provider tells you to remove it. To remove the packing, moisten or wet the packing with saline or water so that it can be removed without harming your skin or tissues. Leave stitches (sutures), skin glue, or adhesive strips in place. These skin closures may need to stay in place for 2 weeks or longer. If adhesive strip edges start to loosen and curl up, you may trim the loose edges. Do not remove adhesive strips completely unless your health care provider tells you to do that. Check your wound every day for signs of infection. Check for: More redness, swelling,  or pain. More fluid or blood. Warmth. Pus or a bad smell. If you were sent home with a drain tube in place, follow instructions from your health care provider about: How to empty it. How to care for it at home.  General instructions Rest the affected area. Do not take baths, swim, or use a hot tub until your health care provider approves. Ask your health care provider if you may take showers. You may only be allowed to take sponge baths. Return to your normal activities as told by your health care provider. Ask your health care provider what activities are safe for you. Your health care provider may put you on activity or lifting restrictions. The incision will continue to drain. It is normal to have some clear or slightly bloody drainage. The amount of drainage should lessen each day. Do not apply any creams, ointments, or liquids unless you have been told to by your health care provider. Keep all follow-up visits as told by your health care provider. This is important. Contact a health care provider if: Your cyst or abscess returns. You have a fever or chills. You have more redness, swelling, or pain around your incision. You have more fluid or blood coming from your incision. Your incision feels warm to the touch. You have pus or a bad smell coming from your incision. You have red streaks above or below the incision site. Get help right away if: You have severe pain or bleeding. You cannot eat or drink without vomiting. You have decreased urine output. You  become short of breath. You have chest pain. You cough up blood. The affected area becomes numb or starts to tingle. These symptoms may represent a serious problem that is an emergency. Do not wait to see if the symptoms will go away. Get medical help right away. Call your local emergency services (911 in the [place] Do not drive yourself to the hospital. Summary After this procedure, it is common to have fluid, blood, or pus  coming from the surgery site. Follow all home care instructions. You will be told how to take care of your incision, how to check for infection, and how to take medicines. If you were prescribed an antibiotic medicine, take it as told by your health care provider. Do not stop taking the antibiotic even if you start to feel better. Contact a health care provider if you have increased redness, swelling, or pain around your incision. Get help right away if you have chest pain, you vomit, you cough up blood, or you have shortness of breath. Keep all follow-up visits as told by your health care provider. This is important. This information is not intended to replace advice given to you by your health care provider. Make sure you discuss any questions you have with your healthcare provider. Document Revised: 01/17/2018 Document Reviewed: 01/17/2018 Elsevier Patient Education  2022 [employer].

## 2020-09-16 ENCOUNTER — Other Ambulatory Visit: Payer: Self-pay

## 2020-09-16 ENCOUNTER — Ambulatory Visit
Admission: RE | Admit: 2020-09-16 | Discharge: 2020-09-16 | Disposition: A | Discharge status: Home / Self Care | Payer: Medicaid Other | Source: Ambulatory Visit | Attending: Obstetrics and Gynecology | Admitting: Obstetrics and Gynecology

## 2020-09-16 DIAGNOSIS — N83201 Unspecified ovarian cyst, right side: Secondary | ICD-10-CM | POA: Insufficient documentation

## 2020-09-19 ENCOUNTER — Ambulatory Visit: Payer: Commercial Managed Care - PPO | Admitting: Sports Medicine

## 2020-09-25 ENCOUNTER — Other Ambulatory Visit: Payer: Self-pay

## 2020-09-25 ENCOUNTER — Ambulatory Visit (INDEPENDENT_AMBULATORY_CARE_PROVIDER_SITE_OTHER): Payer: Commercial Managed Care - PPO | Admitting: Obstetrics and Gynecology

## 2020-09-25 ENCOUNTER — Encounter: Payer: Self-pay | Admitting: Obstetrics and Gynecology

## 2020-09-25 VITALS — BP 116/72 | HR 76 | Ht 61.5 in | Wt 126.2 lb

## 2020-09-25 DIAGNOSIS — N83201 Unspecified ovarian cyst, right side: Secondary | ICD-10-CM

## 2020-09-25 DIAGNOSIS — N764 Abscess of vulva: Secondary | ICD-10-CM | POA: Diagnosis not present

## 2020-09-25 NOTE — Patient Instructions (Signed)
Health Maintenance, Female Adopting a healthy lifestyle and getting preventive care are important in promoting health and wellness. Ask your health care provider about: The right schedule for you to have regular tests and exams. Things you can do on your own to prevent diseases and keep yourself healthy. What should I know about diet, weight, and exercise? Eat a healthy diet  Eat a diet that includes plenty of vegetables, fruits, low-fat dairy products, and lean protein. Do not eat a lot of foods that are high in solid fats, added sugars, or sodium.  Maintain a healthy weight Body mass index (BMI) is used to identify weight problems. It estimates body fat based on height and weight. Your health care provider can help determineyour BMI and help you achieve or maintain a healthy weight. Get regular exercise Get regular exercise. This is one of the most important things you can do for your health. Most adults should: Exercise for at least 150 minutes each week. The exercise should increase your heart rate and make you sweat (moderate-intensity exercise). Do strengthening exercises at least twice a week. This is in addition to the moderate-intensity exercise. Spend less time sitting. Even light physical activity can be beneficial. Watch cholesterol and blood lipids Have your blood tested for lipids and cholesterol at 35 years of age, then havethis test every 5 years. Have your cholesterol levels checked more often if: Your lipid or cholesterol levels are high. You are older than 35 years of age. You are at high risk for heart disease. What should I know about cancer screening? Depending on your health history and family history, you may need to have cancer screening at various ages. This may include screening for: Breast cancer. Cervical cancer. Colorectal cancer. Skin cancer. Lung cancer. What should I know about heart disease, diabetes, and high blood pressure? Blood pressure and heart  disease High blood pressure causes heart disease and increases the risk of stroke. This is more likely to develop in people who have high blood pressure readings, are of African descent, or are overweight. Have your blood pressure checked: Every 3-5 years if you are 18-39 years of age. Every year if you are 40 years old or older. Diabetes Have regular diabetes screenings. This checks your fasting blood sugar level. Have the screening done: Once every three years after age 40 if you are at a normal weight and have a low risk for diabetes. More often and at a younger age if you are overweight or have a high risk for diabetes. What should I know about preventing infection? Hepatitis B If you have a higher risk for hepatitis B, you should be screened for this virus. Talk with your health care provider to find out if you are at risk forhepatitis B infection. Hepatitis C Testing is recommended for: Everyone born from 1945 through 1965. Anyone with known risk factors for hepatitis C. Sexually transmitted infections (STIs) Get screened for STIs, including gonorrhea and chlamydia, if: You are sexually active and are younger than 35 years of age. You are older than 35 years of age and your health care provider tells you that you are at risk for this type of infection. Your sexual activity has changed since you were last screened, and you are at increased risk for chlamydia or gonorrhea. Ask your health care provider if you are at risk. Ask your health care provider about whether you are at high risk for HIV. Your health care provider may recommend a prescription medicine to help   prevent HIV infection. If you choose to take medicine to prevent HIV, you should first get tested for HIV. You should then be tested every 3 months for as long as you are taking the medicine. Pregnancy If you are about to stop having your period (premenopausal) and you may become pregnant, seek counseling before you get  pregnant. Take 400 to 800 micrograms (mcg) of folic acid every day if you become pregnant. Ask for birth control (contraception) if you want to prevent pregnancy. Osteoporosis and menopause Osteoporosis is a disease in which the bones lose minerals and strength with aging. This can result in bone fractures. If you are 65 years old or older, or if you are at risk for osteoporosis and fractures, ask your health care provider if you should: Be screened for bone loss. Take a calcium or vitamin D supplement to lower your risk of fractures. Be given hormone replacement therapy (HRT) to treat symptoms of menopause. Follow these instructions at home: Lifestyle Do not use any products that contain nicotine or tobacco, such as cigarettes, e-cigarettes, and chewing tobacco. If you need help quitting, ask your health care provider. Do not use street drugs. Do not share needles. Ask your health care provider for help if you need support or information about quitting drugs. Alcohol use Do not drink alcohol if: Your health care provider tells you not to drink. You are pregnant, may be pregnant, or are planning to become pregnant. If you drink alcohol: Limit how much you use to 0-1 drink a day. Limit intake if you are breastfeeding. Be aware of how much alcohol is in your drink. In the [place] one drink equals one 12 oz bottle of beer (355 mL), one 5 oz glass of wine (148 mL), or one 1 oz glass of hard liquor (44 mL). General instructions Schedule regular health, dental, and eye exams. Stay current with your vaccines. Tell your health care provider if: You often feel depressed. You have ever been abused or do not feel safe at home. Summary Adopting a healthy lifestyle and getting preventive care are important in promoting health and wellness. Follow your health care provider's instructions about healthy diet, exercising, and getting tested or screened for diseases. Follow your health care provider's  instructions on monitoring your cholesterol and blood pressure. This information is not intended to replace advice given to you by your health care provider. Make sure you discuss any questions you have with your healthcare provider. Document Revised: 02/09/2018 Document Reviewed: 02/09/2018 Elsevier Patient Education  2022 Elsevier Inc.  

## 2020-09-25 NOTE — Progress Notes (Signed)
Diane Garrison presents for follow up of I & D of abscess. And GYN U/S results for ovarian cyst., She has not complaints. GYN U/S results reviewed with pt.  PE AF VSS Lungs clear Heart RRR Abd soft + BS GU Nl EGBUS, abscess has resolved, sutures removed.  A/P S/P I & D of abscess        Ovarian cyst  Cyst has resolved. F/U PRN

## 2020-09-30 DIAGNOSIS — U071 COVID-19: Secondary | ICD-10-CM

## 2020-09-30 DIAGNOSIS — Z8616 Personal history of COVID-19: Secondary | ICD-10-CM

## 2020-09-30 HISTORY — DX: Personal history of COVID-19: Z86.16

## 2020-09-30 HISTORY — DX: COVID-19: U07.1

## 2020-10-17 ENCOUNTER — Ambulatory Visit (INDEPENDENT_AMBULATORY_CARE_PROVIDER_SITE_OTHER): Payer: Commercial Managed Care - PPO | Admitting: Sports Medicine

## 2020-10-17 ENCOUNTER — Encounter: Payer: Self-pay | Admitting: Sports Medicine

## 2020-10-17 ENCOUNTER — Other Ambulatory Visit: Payer: Self-pay

## 2020-10-17 ENCOUNTER — Ambulatory Visit (INDEPENDENT_AMBULATORY_CARE_PROVIDER_SITE_OTHER): Payer: Commercial Managed Care - PPO

## 2020-10-17 DIAGNOSIS — M79674 Pain in right toe(s): Secondary | ICD-10-CM | POA: Diagnosis not present

## 2020-10-17 DIAGNOSIS — L84 Corns and callosities: Secondary | ICD-10-CM | POA: Diagnosis not present

## 2020-10-17 DIAGNOSIS — L905 Scar conditions and fibrosis of skin: Secondary | ICD-10-CM | POA: Diagnosis not present

## 2020-10-17 DIAGNOSIS — Z9889 Other specified postprocedural states: Secondary | ICD-10-CM | POA: Diagnosis not present

## 2020-10-17 DIAGNOSIS — M2041 Other hammer toe(s) (acquired), right foot: Secondary | ICD-10-CM | POA: Diagnosis not present

## 2020-10-17 NOTE — Patient Instructions (Signed)
Shoe List For tennis shoes recommend:  [place] New balance Saucon[place] HOKA Can be purchased at [employer] sports or [employer] arch fit Can be purchased at any major retailers  Vionic  SAS Can be purchased at [employer] or [employer]   For work shoes recommend: [employer] Work [employer] Can be purchased at a variety of places or [employer]   For casual shoes recommend: Oofos Can be purchased at [employer] sports or [employer]  Can be purchased at [employer] or [place] recommend: Power Steps Can be purchased in office/[facility] [place] Can be purchased at [employer] sports or [employer] Can be purchased at [place] recommend: Leisure centre manager at [employer] and [employer]

## 2020-10-17 NOTE — Progress Notes (Signed)
Subjective: Diane Garrison is a 35 y.o. female patient seen today in office for POV #4 (DOS 06-10-20), S/P right 5th toe removal of pain and excision of scar. Patient reports she is doing good. No pain.  Denies any other pedal complaints or constitutional symptoms at this time.  Patient Active Problem List   Diagnosis Date Noted   Left genital labial abscess 09/12/2020   Cyst of ovary, right 06/05/2020   Depression, major, recurrent, moderate ([place]) 04/09/2020   Psychophysiological insomnia 04/09/2020   Hoarseness 12/19/2019   Laryngopharyngeal reflux (LPR) 12/19/2019   Abnormal auditory perception of both ears 10/15/2017   Bilateral temporomandibular joint pain 10/15/2017   Moderate persistent asthma without complication 123456   Tobacco use 08/02/2014   Allergic rhinitis 07/13/2012   Headache, menstrual migraine 07/13/2012   Vitamin D deficiency 01/05/2010   Hypertrophy of breast 02/04/2009   Mastodynia 02/04/2009   Contact dermatitis and other eczema, due to unspecified cause 07/19/2008   Herpes simplex virus (HSV) infection 07/19/2008    Current Outpatient Medications on File Prior to Visit  Medication Sig Dispense Refill   albuterol (PROVENTIL HFA;VENTOLIN HFA) 108 (90 BASE) MCG/ACT inhaler Inhale 2 puffs into the lungs every 6 (six) hours as needed for wheezing or shortness of breath. For shortness of breath.     azithromycin (ZITHROMAX) 250 MG tablet TAKE 2 TABLETS BY MOUTH TODAY, THEN TAKE 1 TABLET DAILY FOR 4 DAYS     cetirizine (ZYRTEC) 10 MG tablet Take 10 mg by mouth at bedtime.     doxycycline (VIBRAMYCIN) 100 MG capsule Take 1 capsule (100 mg total) by mouth 2 (two) times daily. 14 capsule 0   fluticasone (FLONASE) 50 MCG/ACT nasal spray Place 2 sprays into both nostrils daily. 16 g 1   fluticasone-salmeterol (ADVAIR HFA) 45-21 MCG/ACT inhaler Inhale into the lungs.     ibuprofen (ADVIL) 800 MG tablet Take 1 tablet (800 mg total) by mouth every 8 (eight) hours  as needed. 30 tablet 0   ondansetron (ZOFRAN) 4 MG tablet Take 1 tablet (4 mg total) by mouth every 8 (eight) hours as needed for nausea or vomiting. 20 tablet 0   ondansetron (ZOFRAN) 8 MG tablet Take 8 mg by mouth every 8 (eight) hours as needed.     ondansetron (ZOFRAN-ODT) 8 MG disintegrating tablet Take by mouth.     sertraline (ZOLOFT) 100 MG tablet Take by mouth.     sertraline (ZOLOFT) 50 MG tablet Take by mouth.     SUMAtriptan (IMITREX) 100 MG tablet Take 100 mg by mouth every 2 (two) hours as needed for migraine. May repeat in 2 hours if headache persists or recurs.     triamcinolone cream (KENALOG) 0.5 % Apply 1 application topically 6 (six) times daily.      triamcinolone ointment (KENALOG) 0.5 % SMARTSIG:1 Topical Every Night     No current facility-administered medications on file prior to visit.    Allergies  Allergen Reactions   Coconut Oil Swelling   Tape Rash    Adhesive tape allergy.  Paper tape is okay.   Benadryl [Diphenhydramine] Swelling    Tongue swelled   Promethazine Nausea And Vomiting   Ampicillin Rash    Did it involve swelling of the face/tongue/throat, SOB, or low BP? Unknown Did it involve sudden or severe rash/hives, skin peeling, or any reaction on the inside of your mouth or nose? Unknown Did you need to seek medical attention at a hospital or doctor's office? No When  did it last happen?      9 + year If all above answers are "NO", may proceed with cephalosporin use. Pt was given ampicillin in hospital, was asleep when she reacted to it.    Latex Rash    Objective: There were no vitals filed for this visit.  General: No acute distress, AAOx3  Right: Incision well healed, no interdigital maceration, no swelling to right 5th toe, no erythema, no warmth, no drainage, no acute signs of infection noted, Capillary fill time <3 seconds in all digits, gross sensation present via light touch to right, No pain with calf compression.   Assessment and  Plan:  Problem List Items Addressed This Visit   None Visit Diagnoses     S/P foot surgery, right    -  Primary   Relevant Orders   DG Foot Complete Right (Completed)   Hamm[place]oe of right foot       Scar       Corn of toe       Pain of toe of right foot            -Patient seen and evaluated -Xrays consistent with post op arthroplasty of right 5th toe -Continue with good supportive shoe for foot type; shoe list provided -Continue normal activitites -No restrictions -Return PRN   Landis Martins, DPM

## 2020-10-24 DIAGNOSIS — K219 Gastro-esophageal reflux disease without esophagitis: Secondary | ICD-10-CM | POA: Diagnosis not present

## 2020-10-24 DIAGNOSIS — F331 Major depressive disorder, recurrent, moderate: Secondary | ICD-10-CM | POA: Diagnosis not present

## 2020-10-24 DIAGNOSIS — R112 Nausea with vomiting, unspecified: Secondary | ICD-10-CM | POA: Diagnosis not present

## 2020-11-20 HISTORY — PX: ESOPHAGOGASTRODUODENOSCOPY: SHX1529

## 2020-12-13 HISTORY — PX: SIGMOIDOSCOPY: SUR1295

## 2021-01-15 ENCOUNTER — Encounter: Payer: Self-pay | Admitting: Obstetrics and Gynecology

## 2021-01-15 ENCOUNTER — Ambulatory Visit (INDEPENDENT_AMBULATORY_CARE_PROVIDER_SITE_OTHER): Payer: Commercial Managed Care - PPO | Admitting: Obstetrics and Gynecology

## 2021-01-15 ENCOUNTER — Other Ambulatory Visit: Payer: Self-pay

## 2021-01-15 VITALS — BP 119/78 | HR 82 | Ht 61.0 in | Wt 123.0 lb

## 2021-01-15 DIAGNOSIS — N764 Abscess of vulva: Secondary | ICD-10-CM | POA: Diagnosis not present

## 2021-01-15 NOTE — Progress Notes (Signed)
Diane Garrison presents with chronic genital cyst. Has had I & D several times but continues to recur. Desires definite therapy.  PE AF VSS Chaperone present during exam  Lungs clear  Heart RRR Abd soft + BS GU area of chronic cyst, no evidence of infection at present  A/P Chronic genital cyst.  Pt desires definite therapy with exesion. Due to location of cyst recommend procedure take place in out pt surgery center. R/B/Post op care reviewed with pt. Will schedule. F/U with post op appt.

## 2021-01-27 ENCOUNTER — Telehealth: Payer: Self-pay

## 2021-01-27 NOTE — Telephone Encounter (Signed)
Called patient to discuss potential surgery dates, no answer, unable to leave voicemail, will schedule on first available surgery date (12/7).

## 2021-01-31 ENCOUNTER — Encounter (HOSPITAL_BASED_OUTPATIENT_CLINIC_OR_DEPARTMENT_OTHER): Payer: Self-pay | Admitting: Obstetrics and Gynecology

## 2021-02-03 ENCOUNTER — Other Ambulatory Visit: Payer: Self-pay

## 2021-02-03 ENCOUNTER — Encounter (HOSPITAL_BASED_OUTPATIENT_CLINIC_OR_DEPARTMENT_OTHER): Payer: Self-pay | Admitting: Obstetrics and Gynecology

## 2021-02-03 NOTE — Progress Notes (Signed)
Spoke w/ via phone for pre-op interview--- pt Lab needs dos----  cbc             Lab results------ no COVID test -----patient states asymptomatic no test needed Arrive at ------- 0815 on 02-05-2021 NPO after MN NO Solid Food.  Clear liquids from MN until--- 0715 Med rec completed Medications to take morning of surgery ----- wellbutrin, pepcid, zofran, advair inhaler Diabetic medication ----- n/a Patient instructed no nail polish to be worn day of surgery Patient instructed to bring photo id and insurance card day of surgery Patient aware to have Driver (ride ) / caregiver for 24 hours after surgery -- spouse, Building services engineer Patient Special Instructions ----- asked to bring rescue inhaler dos Pre-Op special Istructions ----- n/a Patient verbalized understanding of instructions that were given at this phone interview. Patient denies shortness of breath, chest pain, fever, cough at this phone interview.

## 2021-02-03 NOTE — H&P (Signed)
Diane Garrison is an 35 y.o. female with known labial cyst. Has had multiple I & D of the cyst. She desires definite therapy   Menstrual History: Menarche age: 71 No LMP recorded (lmp unknown). Patient has had a hysterectomy.    Past Medical History:  Diagnosis Date   Allergy    Anemia    Anxiety    Asthma    Bipolar disorder ([place])    Bipolar 2   Cigarette smoker    COVID-19 09/2020   Depression    Hidradenitis suppurativa    Insomnia    Labial cyst 2021   left labial genital cyst, pt has had multiple I & D 's, cyst is chronic   Migraine    Ovarian cyst    right   Panic attacks     Past Surgical History:  Procedure Laterality Date   CESAREAN SECTION  2004,2006,2007,2008   x 4   CHOLECYSTECTOMY     DILITATION & CURRETTAGE/HYSTROSCOPY WITH HYDROTHERMAL ABLATION N/A 03/16/2014   Procedure: DILATATION & CURETTAGE/HYSTEROSCOPY WITH HYDROTHERMAL ABLATION;  Surgeon: Shelly Bombard, MD;  Location: [facility];  Service: Gynecology;  Laterality: N/A;   ESOPHAGOGASTRODUODENOSCOPY  11/20/2020   FOOT SURGERY  2018, 04/2018    right foot 2018, left foot 2020   SIGMOIDOSCOPY  12/13/2020   TUBAL LIGATION  03/20/2007   VAGINAL HYSTERECTOMY Bilateral 08/30/2018   Procedure: HYSTERECTOMY VAGINAL WITH SALPINGECTOMY;  Surgeon: Chancy Milroy, MD;  Location: [facility];  Service: Gynecology;  Laterality: Bilateral;   WISDOM TOOTH EXTRACTION      Family History  Problem Relation Age of Onset   Heart disease Mother    Heart disease Maternal Grandmother     Social History:  reports that she quit smoking about 5 years ago. Her smoking use included cigarettes. She has a 6.50 pack-year smoking history. She has never used smokeless tobacco. She reports current drug use. Drug: Marijuana. She reports that she does not drink alcohol.  Allergies:  Allergies  Allergen Reactions   Coconut Oil Swelling   Tape Rash    Adhesive tape allergy.  Paper tape is okay.   Benadryl [Diphenhydramine]  Swelling    Tongue swelled   Promethazine Nausea And Vomiting   Ampicillin Rash    Did it involve swelling of the face/tongue/throat, SOB, or low BP? Unknown Did it involve sudden or severe rash/hives, skin peeling, or any reaction on the inside of your mouth or nose? Unknown Did you need to seek medical attention at a hospital or doctor's office? No When did it last happen?      9 + year If all above answers are "NO", may proceed with cephalosporin use. Pt was given ampicillin in hospital, was asleep when she reacted to it.    Latex Rash    No medications prior to admission.    Review of Systems  Constitutional: Negative.   Respiratory: Negative.    Cardiovascular: Negative.   Gastrointestinal: Negative.   Genitourinary:  Positive for genital sores.   There were no vitals taken for this visit. Physical Exam Constitutional:      Appearance: Normal appearance.  Cardiovascular:     Rate and Rhythm: Normal rate and regular rhythm.  Pulmonary:     Effort: Pulmonary effort is normal.     Breath sounds: Normal breath sounds.  Abdominal:     General: Abdomen is flat.     Palpations: Abdomen is soft.  Genitourinary:    Comments: Chronic labial cyst noted  Neurological:     Mental Status: She is alert.    No results found for this or any previous visit (from the past 24 hour(s)).  No results found.  Assessment/Plan: Chronic labial cyst  Desires removal. R/B/Post op care reviewed with pt. Pt verbalized understanding and desires to proceed.   Chancy Milroy 02/03/2021, 11:31 AM

## 2021-02-04 NOTE — Anesthesia Preprocedure Evaluation (Addendum)
Anesthesia Evaluation  Patient identified by MRN, date of birth, ID band Patient awake    Reviewed: Allergy & Precautions, H&P , NPO status , Patient's Chart, lab work & pertinent test results  Airway Mallampati: II  TM Distance: >3 FB Neck ROM: Full    Dental no notable dental hx. (+) Teeth Intact, Dental Advisory Given   Pulmonary asthma , Patient abstained from smoking., former smoker,    Pulmonary exam normal breath sounds clear to auscultation       Cardiovascular Exercise Tolerance: Good negative cardio ROS   Rhythm:Regular Rate:Normal     Neuro/Psych  Headaches, Anxiety Depression    GI/Hepatic negative GI ROS, Neg liver ROS,   Endo/Other  negative endocrine ROS  Renal/GU negative Renal ROS  negative genitourinary   Musculoskeletal   Abdominal   Peds  Hematology negative hematology ROS (+)   Anesthesia Other Findings   Reproductive/Obstetrics negative OB ROS                           Anesthesia Physical Anesthesia Plan  ASA: 2  Anesthesia Plan: MAC   Post-op Pain Management: Tylenol PO (pre-op)   Induction: Intravenous  PONV Risk Score and Plan: 3 and Ondansetron, Midazolam and Propofol infusion  Airway Management Planned: Simple Face Mask  Additional Equipment:   Intra-op Plan:   Post-operative Plan:   Informed Consent: I have reviewed the patients History and Physical, chart, labs and discussed the procedure including the risks, benefits and alternatives for the proposed anesthesia with the patient or authorized representative who has indicated his/her understanding and acceptance.     Dental advisory given  Plan Discussed with: CRNA  Anesthesia Plan Comments:        Anesthesia Quick Evaluation

## 2021-02-05 ENCOUNTER — Encounter (HOSPITAL_BASED_OUTPATIENT_CLINIC_OR_DEPARTMENT_OTHER): Payer: Self-pay | Admitting: Obstetrics and Gynecology

## 2021-02-05 ENCOUNTER — Ambulatory Visit (HOSPITAL_BASED_OUTPATIENT_CLINIC_OR_DEPARTMENT_OTHER): Payer: 59 | Admitting: Anesthesiology

## 2021-02-05 ENCOUNTER — Ambulatory Visit (HOSPITAL_BASED_OUTPATIENT_CLINIC_OR_DEPARTMENT_OTHER)
Admission: RE | Admit: 2021-02-05 | Discharge: 2021-02-05 | Disposition: A | Discharge status: Home / Self Care | Payer: 59 | Attending: Obstetrics and Gynecology | Admitting: Obstetrics and Gynecology

## 2021-02-05 ENCOUNTER — Encounter (HOSPITAL_BASED_OUTPATIENT_CLINIC_OR_DEPARTMENT_OTHER)
Admission: RE | Disposition: A | Discharge status: Home / Self Care | Payer: Self-pay | Source: Home / Self Care | Attending: Obstetrics and Gynecology

## 2021-02-05 DIAGNOSIS — N907 Vulvar cyst: Secondary | ICD-10-CM | POA: Diagnosis present

## 2021-02-05 DIAGNOSIS — Z8742 Personal history of other diseases of the female genital tract: Secondary | ICD-10-CM | POA: Diagnosis not present

## 2021-02-05 DIAGNOSIS — N764 Abscess of vulva: Secondary | ICD-10-CM | POA: Diagnosis not present

## 2021-02-05 DIAGNOSIS — Z87891 Personal history of nicotine dependence: Secondary | ICD-10-CM | POA: Diagnosis not present

## 2021-02-05 DIAGNOSIS — Z0389 Encounter for observation for other suspected diseases and conditions ruled out: Secondary | ICD-10-CM | POA: Diagnosis not present

## 2021-02-05 HISTORY — DX: Unspecified ovarian cyst, unspecified side: N83.209

## 2021-02-05 HISTORY — DX: Personal history of other mental and behavioral disorders: Z86.59

## 2021-02-05 HISTORY — DX: Presence of spectacles and contact lenses: Z97.3

## 2021-02-05 HISTORY — DX: Nausea: R11.0

## 2021-02-05 HISTORY — DX: Personal history of diseases of the skin and subcutaneous tissue: Z87.2

## 2021-02-05 HISTORY — DX: Major depressive disorder, single episode, unspecified: F32.9

## 2021-02-05 LAB — CBC
HCT: 40.5 % (ref 36.0–46.0)
Hemoglobin: 13.4 g/dL (ref 12.0–15.0)
MCH: 32.2 pg (ref 26.0–34.0)
MCHC: 33.1 g/dL (ref 30.0–36.0)
MCV: 97.4 fL (ref 80.0–100.0)
Platelets: 285 10*3/uL (ref 150–400)
RBC: 4.16 MIL/uL (ref 3.87–5.11)
RDW: 15.2 % (ref 11.5–15.5)
WBC: 9 10*3/uL (ref 4.0–10.5)
nRBC: 0 % (ref 0.0–0.2)

## 2021-02-05 LAB — POCT PREGNANCY, URINE: Preg Test, Ur: NEGATIVE

## 2021-02-05 SURGERY — EXAM UNDER ANESTHESIA
Anesthesia: Monitor Anesthesia Care | Site: Vagina

## 2021-02-05 MED ORDER — LACTATED RINGERS IV SOLN: INTRAVENOUS | Status: DC

## 2021-02-05 MED ORDER — ACETAMINOPHEN 500 MG PO TABS: 1000.0000 mg | ORAL_TABLET | ORAL | Status: DC

## 2021-02-05 MED ORDER — POVIDONE-IODINE 10 % EX SWAB: 2.0000 "application " | Freq: Once | CUTANEOUS | Status: DC

## 2021-02-05 MED ORDER — FENTANYL CITRATE (PF) 250 MCG/5ML IJ SOLN
INTRAMUSCULAR | Status: DC | PRN
  Administered 2021-02-05: 25 ug via INTRAVENOUS

## 2021-02-05 MED ORDER — ONDANSETRON HCL 4 MG/2ML IJ SOLN
INTRAMUSCULAR | Status: DC | PRN
  Administered 2021-02-05: 4 mg via INTRAVENOUS

## 2021-02-05 MED ORDER — PROPOFOL 500 MG/50ML IV EMUL
INTRAVENOUS | Status: DC | PRN
  Administered 2021-02-05: 75 ug/kg/min via INTRAVENOUS

## 2021-02-05 MED ORDER — ACETAMINOPHEN 500 MG PO TABS
1000.0000 mg | ORAL_TABLET | Freq: Once | ORAL | Status: AC
  Administered 2021-02-05: 1000 mg via ORAL

## 2021-02-05 MED ORDER — LIDOCAINE 2% (20 MG/ML) 5 ML SYRINGE
INTRAMUSCULAR | Status: DC | PRN
  Administered 2021-02-05: 100 mg via INTRAVENOUS

## 2021-02-05 MED ORDER — MIDAZOLAM HCL 2 MG/2ML IJ SOLN
INTRAMUSCULAR | Status: AC
  Filled 2021-02-05: qty 2

## 2021-02-05 MED ORDER — LIDOCAINE 2% (20 MG/ML) 5 ML SYRINGE
INTRAMUSCULAR | Status: AC
  Filled 2021-02-05: qty 5

## 2021-02-05 MED ORDER — ACETAMINOPHEN 500 MG PO TABS
ORAL_TABLET | ORAL | Status: AC
  Filled 2021-02-05: qty 2

## 2021-02-05 MED ORDER — SOD CITRATE-CITRIC ACID 500-334 MG/5ML PO SOLN: 30.0000 mL | ORAL | Status: DC

## 2021-02-05 MED ORDER — PROPOFOL 10 MG/ML IV BOLUS
INTRAVENOUS | Status: DC | PRN
  Administered 2021-02-05: 30 mg via INTRAVENOUS

## 2021-02-05 MED ORDER — FENTANYL CITRATE (PF) 100 MCG/2ML IJ SOLN
INTRAMUSCULAR | Status: AC
  Filled 2021-02-05: qty 2

## 2021-02-05 MED ORDER — MIDAZOLAM HCL 5 MG/5ML IJ SOLN
INTRAMUSCULAR | Status: DC | PRN
  Administered 2021-02-05: 2 mg via INTRAVENOUS

## 2021-02-05 SURGICAL SUPPLY — 19 items
BLADE SURG 11 STRL SS (BLADE) ×3 IMPLANT
ELECT REM PT RETURN 9FT ADLT (ELECTROSURGICAL) ×3
ELECTRODE REM PT RTRN 9FT ADLT (ELECTROSURGICAL) ×2 IMPLANT
GLOVE SURG POLYISO LF SZ6.5 (GLOVE) ×3 IMPLANT
GLOVE SURG POLYISO LF SZ7 (GLOVE) ×3 IMPLANT
GLOVE SURG POLYISO LF SZ7.5 (GLOVE) ×3 IMPLANT
GLOVE SURG UNDER POLY LF SZ6.5 (GLOVE) ×3 IMPLANT
GOWN STRL REUS W/ TWL LRG LVL3 (GOWN DISPOSABLE) ×2 IMPLANT
GOWN STRL REUS W/ TWL XL LVL3 (GOWN DISPOSABLE) ×2 IMPLANT
GOWN STRL REUS W/TWL LRG LVL3 (GOWN DISPOSABLE) ×3
GOWN STRL REUS W/TWL XL LVL3 (GOWN DISPOSABLE) ×3
NEEDLE HYPO 22GX1.5 SAFETY (NEEDLE) ×3 IMPLANT
NS IRRIG 1000ML POUR BTL (IV SOLUTION) ×3 IMPLANT
PACK VAGINAL MINOR WOMEN LF (CUSTOM PROCEDURE TRAY) ×3 IMPLANT
SUT MON AB 3-0 SH 27 (SUTURE) ×6
SUT MON AB 3-0 SH27 (SUTURE) ×4 IMPLANT
TOWEL OR 17X26 10 PK STRL BLUE (TOWEL DISPOSABLE) ×3 IMPLANT
TUBE CONNECTING 12X1/4 (SUCTIONS) ×3 IMPLANT
YANKAUER SUCT BULB TIP NO VENT (SUCTIONS) ×3 IMPLANT

## 2021-02-05 NOTE — Anesthesia Postprocedure Evaluation (Signed)
Anesthesia Post Note  Patient: Diane Garrison  Procedure(s) Performed: EXAM UNDER ANESTHESIA (Vagina )     Patient location during evaluation: PACU Anesthesia Type: MAC Level of consciousness: awake and alert Pain management: pain level controlled Vital Signs Assessment: post-procedure vital signs reviewed and stable Respiratory status: spontaneous breathing, nonlabored ventilation and respiratory function stable Cardiovascular status: stable and blood pressure returned to baseline Postop Assessment: no apparent nausea or vomiting Anesthetic complications: no   No notable events documented.  Last Vitals:  Vitals:   02/05/21 1100 02/05/21 1115  BP: 113/66 118/67  Pulse: 71 72  Resp: 16 17  Temp:    SpO2: 99% 99%    Last Pain:  Vitals:   02/05/21 0853  TempSrc: Oral  PainSc: 0-No pain                 Honestie Kulik,W. EDMOND

## 2021-02-05 NOTE — Transfer of Care (Signed)
Immediate Anesthesia Transfer of Care Note  Patient: Diane Garrison  Procedure(s) Performed: Jasmine December UNDER ANESTHESIA (Vagina )  Patient Location: PACU  Anesthesia Type:MAC  Level of Consciousness: drowsy  Airway & Oxygen Therapy: Patient Spontanous Breathing  Post-op Assessment: Report given to RN  Post vital signs: Reviewed and stable  Last Vitals:  Vitals Value Taken Time  BP 113/66 02/05/21 1049  Temp    Pulse 72 02/05/21 1050  Resp 16 02/05/21 1050  SpO2 99 % 02/05/21 1050  Vitals shown include unvalidated device data.  Last Pain:  Vitals:   02/05/21 0853  TempSrc: Oral  PainSc: 0-No pain      Patients Stated Pain Goal: 5 (19/50/93 2671)  Complications: No notable events documented.

## 2021-02-05 NOTE — Interval H&P Note (Signed)
History and Physical Interval Note:  02/05/2021 9:53 AM  Diane Garrison  has presented today for surgery, with the diagnosis of Gential Cyst.  The various methods of treatment have been discussed with the patient and family. After consideration of risks, benefits and other options for treatment, the patient has consented to  Procedure(s): EXCISION VAGINAL CYST (N/A) as a surgical intervention.  The patient's history has been reviewed, patient examined, no change in status, stable for surgery.  I have reviewed the patient's chart and labs.  Questions were answered to the patient's satisfaction.     Chancy Milroy

## 2021-02-05 NOTE — Op Note (Signed)
02/05/2021  10:47 AM  PATIENT:  Diane Garrison  35 y.o. female  PRE-OPERATIVE DIAGNOSIS:  Genital Cyst  POST-OPERATIVE DIAGNOSIS:  Normal genital and vaginal exam  PROCEDURE:  Procedure(s): EXAM UNDER ANESTHESIA  SURGEON: Arlina Robes, MD  ANESTHESIA:   MAC  EBL:  0 mL   COUNTS:  YES  Procedure:  Pt has a history of chronic left labial cyst. Has required I & D and antibiotic therapy in the past. Pt desires removal of cyst.  Pt was brought back to the operating room. Underwent MAC anesthesia. Placed in the usual dorsal lithotomy position.  Time out completed.   Examination failed to demonstrate chronic cyst. Pt's entire external genital  was examined and was completely normal. No cyst was identified. Vaginal exam was completed as well and was noted to be normal.  The procedure was then terminated.  Pt was taken to PACU in stable condition    Pt was informed of OR findings in recovery. She was discharge home once she meet discharge requirements. She will follow up in the office as needed.

## 2021-02-05 NOTE — Anesthesia Procedure Notes (Signed)
Procedure Name: MAC Date/Time: 02/05/2021 10:30 AM Performed by: Bonney Aid, CRNA Pre-anesthesia Checklist: Patient identified, Emergency Drugs available, Suction available, Patient being monitored and Timeout performed Patient Re-evaluated:Patient Re-evaluated prior to induction Oxygen Delivery Method: Simple face mask Placement Confirmation: positive ETCO2

## 2021-02-05 NOTE — Discharge Instructions (Signed)
  Post Anesthesia Home Care Instructions  Activity: Get plenty of rest for the remainder of the day. A responsible individual must stay with you for 24 hours following the procedure.  For the next 24 hours, DO NOT: -Drive a car -Paediatric nurse -Drink alcoholic beverages -Take any medication unless instructed by your physician -Make any legal decisions or sign important papers.  Meals: Start with liquid foods such as gelatin or soup. Progress to regular foods as tolerated. Avoid greasy, spicy, heavy foods. If nausea and/or vomiting occur, drink only clear liquids until the nausea and/or vomiting subsides. Call your physician if vomiting continues.  Special Instructions/Symptoms: Your throat may feel dry or sore from the anesthesia or the breathing tube placed in your throat during surgery. If this causes discomfort, gargle with warm salt water. The discomfort should disappear within 24 hours.  If you had a scopolamine patch placed behind your ear for the management of post- operative nausea and/or vomiting:  1. The medication in the patch is effective for 72 hours, after which it should be removed.  Wrap patch in a tissue and discard in the trash. Wash hands thoroughly with soap and water. 2. You may remove the patch earlier than 72 hours if you experience unpleasant side effects which may include dry mouth, dizziness or visual disturbances. 3. Avoid touching the patch. Wash your hands with soap and water after contact with the patch.  Do not take any Tylenol until after 3:00 pm today.

## 2021-05-12 ENCOUNTER — Telehealth: Payer: Self-pay | Admitting: Obstetrics and Gynecology

## 2021-05-12 NOTE — Telephone Encounter (Signed)
Returned call to patient.  She had called earlier stating her vaginal cyst was back.  This had previously been surgically removed in December.  She is currently not able to have a bowel movement due to the cyst.   ? ?Patient states she has a GI doctor that she also sees.  She will contact her GI regarding her issues with bowels.  She has been added on to be seen this week in the office and a surgical consult with Dr. Rip Harbour.  ? ? ?

## 2021-05-13 ENCOUNTER — Other Ambulatory Visit: Payer: Self-pay

## 2021-05-13 ENCOUNTER — Encounter (HOSPITAL_COMMUNITY): Payer: Self-pay | Admitting: Emergency Medicine

## 2021-05-13 ENCOUNTER — Emergency Department (HOSPITAL_COMMUNITY)
Admission: EM | Admit: 2021-05-13 | Discharge: 2021-05-14 | Disposition: A | Discharge status: Home / Self Care | Payer: 59 | Attending: Emergency Medicine | Admitting: Emergency Medicine

## 2021-05-13 DIAGNOSIS — Z91040 Latex allergy status: Secondary | ICD-10-CM | POA: Diagnosis not present

## 2021-05-13 DIAGNOSIS — L0231 Cutaneous abscess of buttock: Secondary | ICD-10-CM | POA: Insufficient documentation

## 2021-05-13 DIAGNOSIS — L0291 Cutaneous abscess, unspecified: Secondary | ICD-10-CM

## 2021-05-13 NOTE — ED Triage Notes (Signed)
Pt arrive POV from home with c/o abscess on her bottom for the past few days, getting more big. Pt states she is not able to have a BM because the pain. ?

## 2021-05-14 ENCOUNTER — Ambulatory Visit: Payer: Medicaid Other

## 2021-05-14 DIAGNOSIS — L0231 Cutaneous abscess of buttock: Secondary | ICD-10-CM | POA: Diagnosis not present

## 2021-05-14 MED ORDER — ONDANSETRON HCL 4 MG/2ML IJ SOLN
4.0000 mg | Freq: Once | INTRAMUSCULAR | Status: AC
  Administered 2021-05-14: 4 mg via INTRAVENOUS
  Filled 2021-05-14: qty 2

## 2021-05-14 MED ORDER — FENTANYL CITRATE PF 50 MCG/ML IJ SOSY
50.0000 ug | PREFILLED_SYRINGE | Freq: Once | INTRAMUSCULAR | Status: AC
  Administered 2021-05-14: 50 ug via INTRAVENOUS
  Filled 2021-05-14: qty 1

## 2021-05-14 MED ORDER — OXYCODONE-ACETAMINOPHEN 5-325 MG PO TABS
1.0000 | ORAL_TABLET | Freq: Once | ORAL | Status: AC
  Administered 2021-05-14: 1 via ORAL
  Filled 2021-05-14: qty 1

## 2021-05-14 MED ORDER — LIDOCAINE-EPINEPHRINE 2 %-1:200000 IJ SOLN
10.0000 mL | Freq: Once | INTRAMUSCULAR | Status: AC
Start: 2021-05-14 — End: 2021-05-14
  Administered 2021-05-14: 10 mL
  Filled 2021-05-14: qty 20

## 2021-05-14 MED ORDER — ONDANSETRON 4 MG PO TBDP
4.0000 mg | ORAL_TABLET | Freq: Once | ORAL | Status: AC
  Administered 2021-05-14: 4 mg via ORAL

## 2021-05-14 NOTE — ED Provider Notes (Signed)
?[facility] ?Provider Note ? ? ?CSN: [id number] ?Arrival date & time: 05/13/21  2343 ? ?  ? ?History ? ?Chief Complaint  ?Patient presents with  ? Abscess  ? ? ?Diane Garrison is a 36 y.o. female. ? ?HPI ? ?Patient without significant medical history presents with complaints of a abscess on her left buttocks.  Patient states that she had a bump there for last 3 weeks but over the last couple of days it has gotten larger and become more painful.  She denies any drainage or discharge from  the area, she states that she has had abscesses in the past which all had to be drained.  She has no associated fevers or chills, she does note that she has been constipated as i it hurts to have bowel movements, she denies any stomach pains nausea vomiting, she states she still passing gas, she is not immunocompromise.  ? ? ? ?Home Medications ?Prior to Admission medications   ?Medication Sig Start Date End Date Taking? Authorizing Provider  ?albuterol (PROVENTIL HFA;VENTOLIN HFA) 108 (90 BASE) MCG/ACT inhaler Inhale 2 puffs into the lungs every 6 (six) hours as needed for wheezing or shortness of breath. For shortness of breath.    [provider]  ?buPROPion (WELLBUTRIN XL) 150 MG 24 hr tablet Take 150 mg by mouth daily. 11/28/20   [provider]  ?cetirizine (ZYRTEC) 10 MG tablet Take 10 mg by mouth at bedtime. 09/28/20   [provider]  ?famotidine (PEPCID) 20 MG tablet Take 20 mg by mouth daily as needed. 10/24/20   [provider]  ?fluticasone (FLONASE) 50 MCG/ACT nasal spray Place 2 sprays into both nostrils daily. ?Patient taking differently: Place 2 sprays into both nostrils daily as needed. 09/26/14   Waynetta Pean, PA-C  ?fluticasone-salmeterol (ADVAIR HFA) [phone] MCG/ACT inhaler Inhale 2 puffs into the lungs 2 (two) times daily. 02/16/20   [provider]  ?ondansetron (ZOFRAN) 8 MG tablet Take 8 mg by mouth every 8 (eight) hours  as needed. 02-03-2021  per pt takes one tablet daily in am 09/28/20   [provider]  ?sertraline (ZOLOFT) 100 MG tablet Take 100 mg by mouth at bedtime. 08/27/20   [provider]  ?sertraline (ZOLOFT) 50 MG tablet Take 50 mg by mouth at bedtime. 06/29/19   [provider]  ?SUMAtriptan (IMITREX) 100 MG tablet Take 100 mg by mouth every 2 (two) hours as needed for migraine. May repeat in 2 hours if headache persists or recurs.    [provider]  ?   ? ?Allergies    ?Coconut oil, Tape, Benadryl [diphenhydramine], Promethazine, Ampicillin, and Latex   ? ?Review of Systems   ?Review of Systems  ?Constitutional:  Negative for chills and fever.  ?Respiratory:  Negative for shortness of breath.   ?Cardiovascular:  Negative for chest pain.  ?Gastrointestinal:  Positive for constipation. Negative for abdominal pain, anal bleeding, blood in stool and rectal pain.  ?Skin:  Positive for wound.  ?Neurological:  Negative for headaches.  ? ?Physical Exam ?Updated Vital Signs ?BP (!) 103/51   Pulse 77   Temp 99.9 ?F (37.7 ?C) (Oral)   Resp 17   Ht '5\' 1"'$  (1.549 m)   Wt 55.8 kg   LMP  (LMP Unknown)   SpO2 100%   BMI 23.24 kg/m?  ?Physical Exam ?Vitals and nursing note reviewed. Exam conducted with a chaperone present.  ?Constitutional:   ?   General: She  is not in acute distress. ?   Appearance: She is not ill-appearing.  ?HENT:  ?   Head: Normocephalic and atraumatic.  ?   Nose: No congestion.  ?Eyes:  ?   Conjunctiva/sclera: Conjunctivae normal.  ?Cardiovascular:  ?   Rate and Rhythm: Normal rate and regular rhythm.  ?   Pulses: Normal pulses.  ?   Heart sounds: No murmur heard. ?  No friction rub. No gallop.  ?Pulmonary:  ?   Effort: No respiratory distress.  ?   Breath sounds: No wheezing, rhonchi or rales.  ?Abdominal:  ?   Palpations: Abdomen is soft.  ?   Tenderness: There is no abdominal tenderness. There is no right CVA tenderness or left CVA tenderness.  ?   Comments: Abdomen  nondistended, normoactive bowel sounds, dull to percussion, no guarding, rebound tenderness, peritoneal sign.  ?Genitourinary: ?   Comments: With chaperone presents buttocks was visualized patient has a abscess the size of a quarter on the left gluteus, there is no overlying skin changes fluctuance present no induration, digital rectal exam was also performed rectum was nontender on my exam, no large stool burden, no internal fistula present.  ?Skin: ?   General: Skin is warm and dry.  ?Neurological:  ?   Mental Status: She is alert.  ?Psychiatric:     ?   Mood and Affect: Mood normal.  ? ? ?ED Results / Procedures / Treatments   ?Labs ?(all labs ordered are listed, but only abnormal results are displayed) ?Labs Reviewed - No data to display ? ?EKG ?None ? ?Radiology ?No results found. ? ?Procedures ?Marland Kitchen.Incision and Drainage ? ?Date/Time: 05/14/2021 4:51 AM ?Performed by: Marcello Fennel, PA-C ?Authorized by: Marcello Fennel, PA-C  ? ?Consent:  ?  Consent obtained:  Verbal ?  Consent given by:  Patient ?  Risks discussed:  Bleeding, incomplete drainage, pain and damage to other organs ?  Alternatives discussed:  No treatment, delayed treatment, alternative treatment, observation and referral ?Universal protocol:  ?  Procedure explained and questions answered to patient or proxy's satisfaction: yes   ?  Relevant documents present and verified: yes   ?  Test results available : yes   ?  Imaging studies available: yes   ?  Required blood products, implants, devices, and special equipment available: yes   ?  Site/side marked: yes   ?  Immediately prior to procedure, a time out was called: yes   ?  Patient identity confirmed:  Verbally with patient ?Location:  ?  Type:  Abscess ?  Size:  2cm ?  Location:  Lower extremity ?  Lower extremity location:  Buttock ?  Buttock location:  L buttock ?Pre-procedure details:  ?  Skin preparation:  Betadine ?Sedation:  ?  Sedation type:  None ?Anesthesia:  ?  Anesthesia method:   Local infiltration ?  Local anesthetic:  Lidocaine 2% WITH epi ?Procedure type:  ?  Complexity:  Simple ?Procedure details:  ?  Incision types:  Single straight ?  Incision depth:  Subcutaneous ?  Wound management:  Probed and deloculated ?  Drainage:  Purulent and bloody ?  Drainage amount:  Moderate ?  Wound treatment:  Wound left open ?  Packing materials:  None ?Post-procedure details:  ?  Procedure completion:  Tolerated well, no immediate complications  ? ? ?Medications Ordered in ED ?Medications  ?oxyCODONE-acetaminophen (PERCOCET/ROXICET) 5-325 MG per tablet 1 tablet (1 tablet Oral Given 05/14/21 0052)  ?ondansetron (ZOFRAN-ODT) disintegrating tablet  4 mg (4 mg Oral Given 05/14/21 0055)  ?fentaNYL (SUBLIMAZE) injection 50 mcg (50 mcg Intravenous Given 05/14/21 0350)  ?ondansetron [facility]) injection 4 mg (4 mg Intravenous Given 05/14/21 0350)  ?lidocaine-EPINEPHrine (XYLOCAINE W/EPI) 2 %-1:200000 (PF) injection 10 mL (10 mLs Infiltration Given by Other 05/14/21 0433)  ? ? ?ED Course/ Medical Decision Making/ A&P ?  ?                        ?Medical Decision Making ?Risk ?Prescription drug management. ? ? ?This patient presents to the ED for concern of abscess, this involves an extensive number of treatment options, and is a complaint that carries with it a high risk of complications and morbidity.  The differential diagnosis includes necrotizing fasciitis, cellulitis, fistula ? ? ? ?Additional history obtained: ? ?Additional history obtained from electronic medical record ? ? ? ?Co morbidities that complicate the patient evaluation ? ?N/A ? ?Social Determinants of Health: ? ?N/A ? ? ? ?Lab Tests: ? ?I Ordered, and personally interpreted labs.  The pertinent results include: N/A ? ? ?Imaging Studies ordered: ? ?I ordered imaging studies including N/A ?I independently visualized and interpreted imaging which showed N/A ?I agree with the radiologist interpretation ? ? ?Cardiac Monitoring: ? ?The patient was  maintained on a cardiac monitor.  I personally viewed and interpreted the cardiac monitored which showed an underlying rhythm of: N/A ? ? ?Medicines ordered and prescription drug management: ? ?I ordered medication in

## 2021-05-14 NOTE — ED Provider Triage Note (Signed)
Emergency Medicine Provider Triage Evaluation Note ? ?Diane Garrison , a 36 y.o. female  was evaluated in triage.  Pt complains of abscess on the right buttocks, noticed about 3 weeks ago.  Last couple days is gotten a lot larger and more painful, states she states the size of a silver dollar, she denies any drainage from the area, states she has had this in the past has had them drained twice in the last 6 months.  No systemic infection no fevers or chills, does endorse pain with defecation as it is pressed against the abscess itself.  States that she has not had bowel movement in a week's time does not endorse any stomach pain nausea or vomiting.. ? ?Review of Systems  ?Positive: Pain with bowel movements, abscess ?Negative: Fevers, chills ? ?Physical Exam  ?BP 133/69 (BP Location: Right Arm)   Pulse 94   Temp 99.9 ?F (37.7 ?C) (Oral)   Resp 16   Ht '5\' 1"'$  (1.549 m)   Wt 55.8 kg   LMP  (LMP Unknown)   SpO2 99%   BMI 23.24 kg/m?  ?Gen:   Awake, no distress   ?Resp:  Normal effort  ?MSK:   Moves extremities without difficulty  ?Other:   ? ?Medical Decision Making  ?Medically screening exam initiated at 12:36 AM.  Appropriate orders placed.  Diane Garrison was informed that the remainder of the evaluation will be completed by another provider, this initial triage assessment does not replace that evaluation, and the importance of remaining in the ED until their evaluation is complete. ? ?Presents with an abscess patient will need further work-up. ?  ?Marcello Fennel, PA-C ?05/14/21 0038 ? ?

## 2021-05-14 NOTE — Discharge Instructions (Signed)
Your abscess is draining well, I would like you to apply warm compress to the area and/or take a warm bath 2-3 times daily this will help bring out the infection, I recommend keeping the area clean and change out the dressings frequently.  You may take over-the-counter pain medication as needed like ibuprofen and or Tylenol.  If your symptoms worsen i.e. worsening pain increased redness develop fevers chills having rectal pain I would like to come back in for reevaluation. ? ?I recommend follow-up with general surgery and/or your PCP for further evaluation of your abscesses. ? ?Come back to the emergency department if you develop chest pain, shortness of breath, severe abdominal pain, uncontrolled nausea, vomiting, diarrhea. ? ?

## 2021-06-02 ENCOUNTER — Ambulatory Visit: Payer: Medicaid Other | Admitting: Obstetrics and Gynecology

## 2021-06-03 ENCOUNTER — Other Ambulatory Visit: Payer: Self-pay

## 2021-06-03 ENCOUNTER — Encounter (HOSPITAL_COMMUNITY): Payer: Self-pay | Admitting: Emergency Medicine

## 2021-06-03 ENCOUNTER — Emergency Department (HOSPITAL_COMMUNITY)
Admission: EM | Admit: 2021-06-03 | Discharge: 2021-06-03 | Disposition: A | Discharge status: Home / Self Care | Payer: Medicaid Other | Attending: Emergency Medicine | Admitting: Emergency Medicine

## 2021-06-03 DIAGNOSIS — L0291 Cutaneous abscess, unspecified: Secondary | ICD-10-CM

## 2021-06-03 DIAGNOSIS — L0231 Cutaneous abscess of buttock: Secondary | ICD-10-CM | POA: Insufficient documentation

## 2021-06-03 DIAGNOSIS — Z91040 Latex allergy status: Secondary | ICD-10-CM | POA: Insufficient documentation

## 2021-06-03 MED ORDER — DOXYCYCLINE HYCLATE 100 MG PO CAPS: 100.0000 mg | ORAL_CAPSULE | Freq: Two times a day (BID) | ORAL | 0 refills | Status: DC

## 2021-06-03 NOTE — ED Provider Notes (Signed)
?[facility] ?Provider Note ? ? ?CSN: [id number] ?Arrival date & time: 06/03/21  1619 ? ?  ? ?History ? ?Chief Complaint  ?Patient presents with  ? Abscess  ? ? ?Diane Garrison is a 36 y.o. female. ? ?36 year old female with prior medical history as detailed below presents for evaluation. ? ?She reports prior history of numerous superficial skin abscesses to the left buttock.  She reports recent I&D of same.  She reports that she feels like the abscess is starting to come back.  She denies fever. ? ?She denies other complaint. ? ?The history is provided by the patient and medical records.  ?Abscess ?Abscess location: Suspected abscess of the left buttock. ?Abscess quality: painful   ?Abscess quality: not draining and no induration   ?Red streaking: no   ?Duration:  3 days ?Progression:  Unchanged ?Pain details:  ?  Quality:  Pressure ?  Severity:  Mild ? ?  ? ?Home Medications ?Prior to Admission medications   ?Medication Sig Start Date End Date Taking? Authorizing Provider  ?doxycycline (VIBRAMYCIN) 100 MG capsule Take 1 capsule (100 mg total) by mouth 2 (two) times daily. 06/03/21  Yes Valarie Merino, MD  ?albuterol (PROVENTIL HFA;VENTOLIN HFA) 108 (90 BASE) MCG/ACT inhaler Inhale 2 puffs into the lungs every 6 (six) hours as needed for wheezing or shortness of breath. For shortness of breath.    [provider]  ?buPROPion (WELLBUTRIN XL) 150 MG 24 hr tablet Take 150 mg by mouth daily. 11/28/20   [provider]  ?cetirizine (ZYRTEC) 10 MG tablet Take 10 mg by mouth at bedtime. 09/28/20   [provider]  ?famotidine (PEPCID) 20 MG tablet Take 20 mg by mouth daily as needed. 10/24/20   [provider]  ?fluticasone (FLONASE) 50 MCG/ACT nasal spray Place 2 sprays into both nostrils daily. ?Patient taking differently: Place 2 sprays into both nostrils daily as needed. 09/26/14   Waynetta Pean, PA-C  ?fluticasone-salmeterol (ADVAIR HFA)  [phone] MCG/ACT inhaler Inhale 2 puffs into the lungs 2 (two) times daily. 02/16/20   [provider]  ?ondansetron (ZOFRAN) 8 MG tablet Take 8 mg by mouth every 8 (eight) hours as needed. 02-03-2021  per pt takes one tablet daily in am 09/28/20   [provider]  ?sertraline (ZOLOFT) 100 MG tablet Take 100 mg by mouth at bedtime. 08/27/20   [provider]  ?sertraline (ZOLOFT) 50 MG tablet Take 50 mg by mouth at bedtime. 06/29/19   [provider]  ?SUMAtriptan (IMITREX) 100 MG tablet Take 100 mg by mouth every 2 (two) hours as needed for migraine. May repeat in 2 hours if headache persists or recurs.    [provider]  ?   ? ?Allergies    ?Coconut oil, Tape, Benadryl [diphenhydramine], Promethazine, Ampicillin, and Latex   ? ?Review of Systems   ?Review of Systems  ?All other systems reviewed and are negative. ? ?Physical Exam ?Updated Vital Signs ?BP 108/70 (BP Location: Left Arm)   Pulse 90   Temp 98.7 ?F (37.1 ?C) (Oral)   Resp 20   LMP  (LMP Unknown)   SpO2 100%  ?Physical Exam ?Vitals and nursing note reviewed.  ?Constitutional:   ?   General: She is not in acute distress. ?   Appearance: Normal appearance. She is well-developed.  ?HENT:  ?   Head: Normocephalic and atraumatic.  ?Eyes:  ?   Conjunctiva/sclera: Conjunctivae normal.  ?   Pupils: Pupils  are equal, round, and reactive to light.  ?Cardiovascular:  ?   Rate and Rhythm: Normal rate and regular rhythm.  ?   Heart sounds: Normal heart sounds.  ?Pulmonary:  ?   Effort: Pulmonary effort is normal. No respiratory distress.  ?   Breath sounds: Normal breath sounds.  ?Abdominal:  ?   General: There is no distension.  ?   Palpations: Abdomen is soft.  ?   Tenderness: There is no abdominal tenderness.  ?Musculoskeletal:     ?   General: No deformity. Normal range of motion.  ?   Cervical back: Normal range of motion and neck supple.  ?Skin: ?   General: Skin is warm and dry.  ?   Comments: Superficial possible   early abscess to left medial buttock   ?Neurological:  ?   General: No focal deficit present.  ?   Mental Status: She is alert and oriented to person, place, and time.  ? ? ?ED Results / Procedures / Treatments   ?Labs ?(all labs ordered are listed, but only abnormal results are displayed) ?Labs Reviewed - No data to display ? ?EKG ?None ? ?Radiology ?No results found. ? ?Procedures ?Procedures  ? ? ?Medications Ordered in ED ?Medications - No data to display ? ?ED Course/ Medical Decision Making/ A&P ?  ?                        ?Medical Decision Making ?Risk ?Prescription drug management. ? ? ? ?Medical Screen Complete ? ?This patient presented to the ED with complaint of skin abscess. ? ?This complaint involves an extensive number of treatment options. The initial differential diagnosis includes, but is not limited to, abscess ? ?This presentation is: Acute, Chronic, Self-Limited, Previously Undiagnosed, and Uncertain Prognosis ? ?Patient is presenting with complaint of likely recurrent skin abscess.  Patient has had several abscesses on the left buttock.  She reports recent I&D approximately 1 month ago. ? ?Patient reports recurrence of mild tenderness to the area.  She is concerned about possible recurrent abscess. ? ?Exam is suggestive of possible cysts with possible early infection. ? ?I&D discussed with the patient.  Patient declines I&D.  She is agreeable to course of antibiotics.  Of note, patient's exam is not suggestive of cellulitis or significant infection to the area. ? ?Patient is advised to follow-up closely with surgery.  She is advised to return if symptoms worsen. ? ?Patient reported to this provider as he was leaving the room that she was not going to leave.  She did not want to wait on her papers with discharge instructions. ? ? ?Additional history obtained: ? ?External records from outside sources obtained and reviewed including prior ED visits and prior Inpatient records.  ? ? ?Problem List /  ED Course: ? ?Possible early skin abscess to left buttock ? ? ?Reevaluation: ? ?After the interventions noted above, I reevaluated the patient and found that they have: stayed the same ? ? ?Disposition: ? ?After consideration of the diagnostic results and the patients response to treatment, I feel that the patent would benefit from close outpatient follow-up.  ? ? ? ? ? ? ? ? ?Final Clinical Impression(s) / ED Diagnoses ?Final diagnoses:  ?Abscess  ? ? ?Rx / DC Orders ?ED Discharge Orders   ? ?      Ordered  ?  doxycycline (VIBRAMYCIN) 100 MG capsule  2 times daily       ? 06/03/21 2138  ? ?  ?  ? ?  ? ? ?  ?  Valarie Merino, MD ?06/03/21 2143 ? ?

## 2021-06-03 NOTE — ED Triage Notes (Signed)
Pt c/o recurrent abscess to her buttock. ?

## 2021-06-03 NOTE — Discharge Instructions (Addendum)
Return for any problem.  ? ?Follow up with surgery as instructed. ?

## 2021-06-04 ENCOUNTER — Encounter: Payer: Self-pay | Admitting: Sports Medicine

## 2021-06-05 ENCOUNTER — Telehealth: Payer: Self-pay | Admitting: *Deleted

## 2021-06-05 ENCOUNTER — Ambulatory Visit (INDEPENDENT_AMBULATORY_CARE_PROVIDER_SITE_OTHER): Payer: Medicaid Other

## 2021-06-05 ENCOUNTER — Encounter: Payer: Self-pay | Admitting: Podiatry

## 2021-06-05 ENCOUNTER — Ambulatory Visit (INDEPENDENT_AMBULATORY_CARE_PROVIDER_SITE_OTHER): Payer: Medicaid Other | Admitting: Podiatry

## 2021-06-05 ENCOUNTER — Telehealth: Payer: Self-pay | Admitting: Podiatry

## 2021-06-05 DIAGNOSIS — S99922A Unspecified injury of left foot, initial encounter: Secondary | ICD-10-CM

## 2021-06-05 NOTE — Telephone Encounter (Signed)
Patient was seen by Dr Blenda Mounts today-06/05/21

## 2021-06-05 NOTE — Telephone Encounter (Signed)
Pt states she saw Dr Blenda Mounts today and was given a shoe to wear but the boot is too big for her foot. She wants know what she should do with the boot that she cant wear. She scheduled with Dr Cannon Kettle in two weeks but wants to know what to do in the meantime as she is still in pain with her toe. ? ?Please advise. ?

## 2021-06-05 NOTE — Telephone Encounter (Signed)
"  Reaching out regards to prior imaging.  Please send [employer]." ?

## 2021-06-05 NOTE — Progress Notes (Signed)
?  Subjective:  ?Patient ID: Diane Garrison, female    DOB: 01/25/1986,   MRN: [id number] ? ?No chief complaint on file. ? ? ?36 y.o. female presents for concern of left pinky toe injury. Relates two days ago she was falling and tried to prevent herself and tripped and bent her pinky toe back. Relates it has been painful and affecting her gai.t Relates she has been icing and using OTC medicine. She has had previous surgery by Dr. Cannon Kettle on this toe.  . Denies any other pedal complaints. Denies n/v/f/c.  ? ?Past Medical History:  ?Diagnosis Date  ? Anxiety   ? Asthma   ? followed by pcp   (02-03-2021 per pt last exacerbation 01/ 2022)  ? Chronic nausea   ? (02-03-2021  per pt started " for a while" with occasional vomiting  ,  sees dr Shary Key (GI),  per pt unsure why,  pt does take one zofran daily in am to control nausea)  ? H/O hidradenitis suppurativa   ? History of COVID-19 09/2020  ? History of panic attacks   ? Insomnia   ? Labial cyst 2021  ? left labial genital cyst, pt has had multiple I & D 's, cyst is chronic  ? MDD (major depressive disorder)   ? Bipolar 2  ? Migraine   ? Ovarian cyst   ? right  ? Wears contact lenses   ? ? ?Objective:  ?Physical Exam: ?Vascular: DP/PT pulses 2/4 bilateral. CFT <3 seconds. Normal hair growth on digits. No edema.  ?Skin. No lacerations or abrasions bilateral feet.  ?Musculoskeletal: MMT 5/5 bilateral lower extremities in DF, PF, Inversion and Eversion. Deceased ROM in DF of ankle joint. Tender to PIPJ of left fifth digit. No pain proximally. Pain with ROM of the digit. ?Neurological: Sensation intact to light touch.  ? ?Assessment:  ? ?1. Injury of toe on left foot, initial encounter   ? ? ? ?Plan:  ?Patient was evaluated and treated and all questions answered. ?-Xrays reviewed. No acute fractures or dislocations.  ?-Discussed treatement options for toe fracture; risks, alternatives, and benefits explained..  ?-Dispensed surgical shoe. Patient to wear at all times  and instructed on use ?-Recommend protection, rest, ice, elevation daily until symptoms improve ?-Rx pain med/antinflammatories as needed ?-Patient to return to office as needed.  ? ? ?Lorenda Peck, DPM  ? ? ?

## 2021-06-18 ENCOUNTER — Encounter: Payer: Self-pay | Admitting: Obstetrics and Gynecology

## 2021-06-18 ENCOUNTER — Ambulatory Visit (INDEPENDENT_AMBULATORY_CARE_PROVIDER_SITE_OTHER): Payer: Medicaid Other | Admitting: Obstetrics and Gynecology

## 2021-06-18 DIAGNOSIS — L729 Follicular cyst of the skin and subcutaneous tissue, unspecified: Secondary | ICD-10-CM | POA: Diagnosis not present

## 2021-06-19 ENCOUNTER — Ambulatory Visit (INDEPENDENT_AMBULATORY_CARE_PROVIDER_SITE_OTHER): Payer: Medicaid Other

## 2021-06-19 ENCOUNTER — Ambulatory Visit (INDEPENDENT_AMBULATORY_CARE_PROVIDER_SITE_OTHER): Payer: Medicaid Other | Admitting: Sports Medicine

## 2021-06-19 ENCOUNTER — Encounter: Payer: Self-pay | Admitting: Sports Medicine

## 2021-06-19 DIAGNOSIS — S99922A Unspecified injury of left foot, initial encounter: Secondary | ICD-10-CM

## 2021-06-19 DIAGNOSIS — M79675 Pain in left toe(s): Secondary | ICD-10-CM

## 2021-06-19 DIAGNOSIS — F331 Major depressive disorder, recurrent, moderate: Secondary | ICD-10-CM | POA: Diagnosis not present

## 2021-06-19 DIAGNOSIS — S93506A Unspecified sprain of unspecified lesser toe(s), initial encounter: Secondary | ICD-10-CM | POA: Diagnosis not present

## 2021-06-19 DIAGNOSIS — S99922D Unspecified injury of left foot, subsequent encounter: Secondary | ICD-10-CM

## 2021-06-19 DIAGNOSIS — G4700 Insomnia, unspecified: Secondary | ICD-10-CM | POA: Diagnosis not present

## 2021-06-19 DIAGNOSIS — Z79899 Other long term (current) drug therapy: Secondary | ICD-10-CM | POA: Diagnosis not present

## 2021-06-19 NOTE — Progress Notes (Signed)
Diane Garrison presents for Er follow of gluteal abscess. ?Abscess has resolved but she still pain in the area. ? ?PE AF VSS ?Chaperone present during exam ? ?Lungs clear Heart RRR ?Abd soft + BS ?Buttocks small subcutaneous cyst noted in area were abscess was. ? ?A/P Chronic gluteal cyst ? ?Will refer to general surgery for eval and possible excision. F/U PRN ? ?

## 2021-06-19 NOTE — Progress Notes (Signed)
Subjective: ?Diane Garrison is a 36 y.o. female patient who presents to office for evaluation of left fifth toe pain states that she went to [place] 2 weeks ago and was seen there but wanted a second opinion on her pain and swelling at the left fifth toe states that she failed and brought/stubbed the baby toe when she fell and states that it is still painful and she was given a surgical shoe that did not fit so she did not wear it states that she still has a difficult time with walking and standing and did not have a good experience at her last encounter. ? ?Patient Active Problem List  ? Diagnosis Date Noted  ? Cyst of buttocks 06/18/2021  ? Depression, major, recurrent, moderate ([place]) 04/09/2020  ? Psychophysiological insomnia 04/09/2020  ? Laryngopharyngeal reflux (LPR) 12/19/2019  ? Abnormal auditory perception of both ears 10/15/2017  ? Bilateral temporomandibular joint pain 10/15/2017  ? Moderate persistent asthma without complication 27/25/3664  ? Tobacco use 08/02/2014  ? Allergic rhinitis 07/13/2012  ? Headache, menstrual migraine 07/13/2012  ? Vitamin D deficiency 01/05/2010  ? Hypertrophy of breast 02/04/2009  ? Mastodynia 02/04/2009  ? Contact dermatitis and other eczema, due to unspecified cause 07/19/2008  ? ? ?Current Outpatient Medications on File Prior to Visit  ?Medication Sig Dispense Refill  ? albuterol (PROVENTIL HFA;VENTOLIN HFA) 108 (90 BASE) MCG/ACT inhaler Inhale 2 puffs into the lungs every 6 (six) hours as needed for wheezing or shortness of breath. For shortness of breath.    ? buPROPion (WELLBUTRIN XL) 150 MG 24 hr tablet Take 150 mg by mouth daily.    ? cetirizine (ZYRTEC) 10 MG tablet Take 10 mg by mouth at bedtime.    ? doxycycline (VIBRAMYCIN) 100 MG capsule Take 1 capsule (100 mg total) by mouth 2 (two) times daily. (Patient not taking: Reported on 06/18/2021) 20 capsule 0  ? famotidine (PEPCID) 20 MG tablet Take 20 mg by mouth daily as needed. (Patient not taking:  Reported on 06/18/2021)    ? fluticasone (FLONASE) 50 MCG/ACT nasal spray Place 2 sprays into both nostrils daily. (Patient taking differently: Place 2 sprays into both nostrils daily as needed.) 16 g 1  ? fluticasone-salmeterol (ADVAIR HFA) 45-21 MCG/ACT inhaler Inhale 2 puffs into the lungs 2 (two) times daily.    ? ondansetron (ZOFRAN) 8 MG tablet Take 8 mg by mouth every 8 (eight) hours as needed. 02-03-2021  per pt takes one tablet daily in am    ? sertraline (ZOLOFT) 100 MG tablet Take 100 mg by mouth at bedtime.    ? sertraline (ZOLOFT) 50 MG tablet Take 50 mg by mouth at bedtime. (Patient not taking: Reported on 06/18/2021)    ? SUMAtriptan (IMITREX) 100 MG tablet Take 100 mg by mouth every 2 (two) hours as needed for migraine. May repeat in 2 hours if headache persists or recurs.    ? ?No current facility-administered medications on file prior to visit.  ? ? ?Allergies  ?Allergen Reactions  ? Coconut Oil Swelling  ? Tape Rash  ?  Adhesive tape allergy.  Paper tape is okay.  ? Benadryl [Diphenhydramine] Swelling  ?  Tongue swelled  ? Promethazine Nausea And Vomiting  ? Ampicillin Rash  ?  Did it involve swelling of the face/tongue/throat, SOB, or low BP? Unknown ?Did it involve sudden or severe rash/hives, skin peeling, or any reaction on the inside of your mouth or nose? Unknown ?Did you need to seek medical attention  at a hospital or doctor's office? No ?When did it last happen?      9 + year ?If all above answers are ?NO?, may proceed with cephalosporin use. ?Pt was given ampicillin in hospital, was asleep when she reacted to it. ?  ? Latex Rash  ? ? ?Objective:  ?General: Alert and oriented x3 in no acute distress ? ?Dermatology: Old surgical scar well-healed at left fifth toe distal interphalangeal joint.  No open lesions bilateral lower extremities, no webspace macerations, no ecchymosis bilateral, all nails x 10 are well manicured. ? ?Vascular: Dorsalis Pedis and Posterior Tibial pedal pulses palpable,  Capillary Fill Time 3 seconds,(+) pedal hair growth bilateral, faint swelling noted at the left fifth MPJ and fifth toe, no current ecchymosis at this area, temperature gradient within normal limits. ? ?Neurology: Gross sensation intact via light touch especially to the affected area at the left fifth toe. ? ?Musculoskeletal: Mild tenderness with palpation at left fifth toe with direct palpation over the interphalangeal joint as well as the MPJ pain is worsened with medial to lateral pressure with mild guarding due to pain at the left fifth toe. ? ?Gait: Antalgic gait ? ?Assessment and Plan: ?Problem List Items Addressed This Visit   ?None ?Visit Diagnoses   ? ? Injury of toe on left foot, subsequent encounter    -  Primary  ? Relevant Orders  ? DG Foot Complete Left  ? Sprain of fifth toe      ? Toe pain, left      ? ?  ?  ? ?-Complete examination performed ?-Xrays reviewed from previous encounter with Dr. Blenda Mounts; there is no obvious signs of fracture but did educate patient that she could have a sprain injury which can take just as long to heal ?-Discussed treatement options ?-Dispensed a new surgical shoe;proper fitting size and advised patient to continue with rest elevation and ice and protection and over-the-counter anti-inflammatories ?-Patient to return to office next month for repeat x-ray especially if it is still painful or sooner if condition worsens.  Advised patient if pain continues may also benefit from a CAT scan or MRI however at this time we will await her follow-up to determine if this is necessary to give her symptoms a little bit more time to improve. ? ?Landis Martins, DPM ? ?

## 2021-07-17 ENCOUNTER — Ambulatory Visit: Payer: Medicaid Other | Admitting: Sports Medicine

## 2021-07-30 ENCOUNTER — Ambulatory Visit: Payer: Medicaid Other | Admitting: Podiatry

## 2021-08-21 ENCOUNTER — Ambulatory Visit (INDEPENDENT_AMBULATORY_CARE_PROVIDER_SITE_OTHER)
Admission: EM | Admit: 2021-08-21 | Discharge: 2021-08-21 | Disposition: A | Discharge status: Home / Self Care | Payer: Medicaid Other | Source: Home / Self Care | Attending: Family Medicine | Admitting: Family Medicine

## 2021-08-21 ENCOUNTER — Encounter (HOSPITAL_COMMUNITY): Payer: Self-pay | Admitting: *Deleted

## 2021-08-21 DIAGNOSIS — N10 Acute tubulo-interstitial nephritis: Secondary | ICD-10-CM

## 2021-08-21 DIAGNOSIS — L0231 Cutaneous abscess of buttock: Secondary | ICD-10-CM

## 2021-08-21 HISTORY — DX: Cutaneous abscess, unspecified: L02.91

## 2021-08-21 LAB — POCT URINALYSIS DIPSTICK, ED / UC
Bilirubin Urine: NEGATIVE
Glucose, UA: NEGATIVE mg/dL
Ketones, ur: 40 mg/dL — AB
Leukocytes,Ua: NEGATIVE
Nitrite: POSITIVE — AB
Protein, ur: 100 mg/dL — AB
Specific Gravity, Urine: >=1.03 (ref 1.005–1.030)
Urobilinogen, UA: 0.2 mg/dL (ref 0.0–1.0)
pH: 6 (ref 5.0–8.0)

## 2021-08-21 MED ORDER — ONDANSETRON 4 MG PO TBDP
4.0000 mg | ORAL_TABLET | Freq: Once | ORAL | Status: AC
  Administered 2021-08-21: 4 mg via ORAL

## 2021-08-21 MED ORDER — HYDROCODONE-ACETAMINOPHEN 5-325 MG PO TABS
1.0000 | ORAL_TABLET | Freq: Once | ORAL | Status: AC
  Administered 2021-08-21: 1 via ORAL

## 2021-08-21 MED ORDER — ONDANSETRON 4 MG PO TBDP
ORAL_TABLET | ORAL | Status: AC
  Filled 2021-08-21: qty 1

## 2021-08-21 MED ORDER — SULFAMETHOXAZOLE-TRIMETHOPRIM 800-160 MG PO TABS: 1.0000 | ORAL_TABLET | Freq: Two times a day (BID) | ORAL | 0 refills | Status: DC

## 2021-08-21 MED ORDER — ONDANSETRON 4 MG PO TBDP: 4.0000 mg | ORAL_TABLET | Freq: Three times a day (TID) | ORAL | 0 refills | Status: DC | PRN

## 2021-08-21 MED ORDER — HYDROCODONE-ACETAMINOPHEN 5-325 MG PO TABS
ORAL_TABLET | ORAL | Status: AC
  Filled 2021-08-21: qty 1

## 2021-08-21 NOTE — ED Provider Notes (Signed)
[facility]    ASSESSMENT & PLAN:  1. Abscess of buttock   2. Acute pyelonephritis    Recurrent buttock abscess; has spontaneously drained. Doesn't look bad. Minimal erythema. Likely she is feeling very bad from UTI/pyelo. Discussed. Tolerating PO fluid intake. Agrees to start antibiotic tonight.  Meds ordered this encounter  Medications   ondansetron (ZOFRAN-ODT) disintegrating tablet 4 mg   HYDROcodone-acetaminophen (NORCO/VICODIN) 5-325 MG per tablet 1 tablet   sulfamethoxazole-trimethoprim (BACTRIM DS) 800-160 MG tablet    Sig: Take 1 tablet by mouth 2 (two) times daily for 7 days.    Dispense:  14 tablet    Refill:  0   ondansetron (ZOFRAN-ODT) 4 MG disintegrating tablet    Sig: Take 1 tablet (4 mg total) by mouth every 8 (eight) hours as needed for nausea or vomiting.    Dispense:  15 tablet    Refill:  0   Work note provided.  Recommend:  Follow-up Information     [facility].   Specialty: Emergency Medicine Why: If symptoms worsen in any way. Contact information: [address] [id number] [place] [phone]                Outlined signs and symptoms indicating need for more acute intervention. Patient verbalized understanding. After Visit Summary given.  SUBJECTIVE:  Diane Garrison is a 36 y.o. female who reports recurrent R buttock abscess. No prev surgeries. Recent flare over past week. Has drained over past two days. Yest evening and today with subj fever/chills; fatigue. R flank pain noted today. No specific urinary frequency, urgency, dysuria. No vag d/c. Mild nausea with one emesis this am.  LMP: No LMP recorded (lmp unknown). Patient has had a hysterectomy.   OBJECTIVE:  Vitals:   08/21/21 1649  BP: (!) 144/88  Pulse: (!) 103  Resp: 16  Temp: 98.7 F (37.1 C)  TempSrc: Oral  SpO2: 97%   Slight tachycardia noted.  General appearance: alert;  no distress; appears fatigued HENT: oropharynx: moist Lungs: unlabored respirations Abdomen: soft, non-tender; bowel sounds normal; no masses or organomegaly; no guarding or rebound tenderness Back: RIGHT CVA tenderness Extremities: no edema; symmetrical with no gross deformities Skin: warm and dry; opened punctate wound over R mid buttock; no active drainage or bleeding; mild erythema; mild TTP (chaperone present) Neurologic: normal gait Psychological: alert and cooperative; normal mood and affect  Labs Reviewed  POCT URINALYSIS DIPSTICK, ED / UC - Abnormal; Notable for the following components:      Result Value   Ketones, ur 40 (*)    Hgb urine dipstick LARGE (*)    Protein, ur 100 (*)    Nitrite POSITIVE (*)    All other components within normal limits  URINE CULTURE   Allergies  Allergen Reactions   Coconut (Cocos Nucifera) Swelling   Tape Rash    Adhesive tape allergy.  Paper tape is okay.   Benadryl [Diphenhydramine] Swelling    Tongue swelled   Promethazine Nausea And Vomiting   Ampicillin Rash    Did it involve swelling of the face/tongue/throat, SOB, or low BP? Unknown Did it involve sudden or severe rash/hives, skin peeling, or any reaction on the inside of your mouth or nose? Unknown Did you need to seek medical attention at a hospital or doctor's office? No When did it last happen?      9 + year If all above answers are "NO", may proceed with cephalosporin use.  Pt was given ampicillin in hospital, was asleep when she reacted to it.    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  10/1 IMO update   History of COVID-19 09/2020   History of panic attacks    Insomnia    Labial cyst 2021   left labial genital cyst, pt has had multiple I & D 's, cyst is chronic   MDD (major depressive disorder)    Bipolar 2   Migraine    Ovarian cyst    right   Wears contact lenses    Social History   Socioeconomic History   Marital status: Married    Spouse name: Not on file   Number of children: 4   Years of education: Not on file   Highest education level: Not on file  Occupational History   Not on file  Tobacco Use   Smoking status: Former    Packs/day: 0.50    Years: 13.00    Total pack years: 6.50    Types: Cigarettes    Quit date: 2017    Years since quitting: 6.4   Smokeless tobacco: Never  Vaping Use   Vaping Use: Never used  Substance and Sexual Activity   Alcohol use: No    Alcohol/week: 0.0 standard drinks of alcohol   Drug use: Not Currently    Types: Marijuana   Sexual activity: Yes    Partners: Female    Birth control/protection: Surgical    Comment: with same sex spouse  Other Topics Concern   Not on file  Social History Narrative   Not on file   Social Determinants of Health   Financial Resource Strain: Not on file  Food Insecurity: Not on file  Transportation Needs: Not on file  Physical Activity: Not on file  Stress: Not on file  Social Connections: Not on file  Intimate Partner Violence: Not on file   Family History  Problem Relation Age of Onset   Heart disease Mother    Heart disease Maternal Izetta Dakin, MD 08/21/21 [phone]

## 2021-08-21 NOTE — ED Triage Notes (Signed)
Reports right buttock abscess x yrs. States yesterday right buttock abscess started draining. Reports general malaise, tactile fevers, and right side pain onset yesterday. C/O nausea and vomiting.

## 2021-08-21 NOTE — Discharge Instructions (Addendum)
You have had labs (urine culture) sent today. We will call you with any significant abnormalities or if there is need to begin or change treatment or pursue further follow up.  You may also review your test results online through MyChart. If you do not have a MyChart account, instructions to sign up should be on your discharge paperwork.  

## 2021-08-22 ENCOUNTER — Emergency Department (HOSPITAL_COMMUNITY): Payer: Medicaid Other

## 2021-08-22 ENCOUNTER — Inpatient Hospital Stay (HOSPITAL_COMMUNITY)
Admission: EM | Admit: 2021-08-22 | Discharge: 2021-08-24 | DRG: 690 | Disposition: A | Discharge status: Home / Self Care | Payer: Medicaid Other | Attending: Internal Medicine | Admitting: Internal Medicine

## 2021-08-22 DIAGNOSIS — R0789 Other chest pain: Secondary | ICD-10-CM | POA: Diagnosis not present

## 2021-08-22 DIAGNOSIS — Z7951 Long term (current) use of inhaled steroids: Secondary | ICD-10-CM

## 2021-08-22 DIAGNOSIS — R7989 Other specified abnormal findings of blood chemistry: Secondary | ICD-10-CM

## 2021-08-22 DIAGNOSIS — B961 Klebsiella pneumoniae [K. pneumoniae] as the cause of diseases classified elsewhere: Secondary | ICD-10-CM | POA: Diagnosis present

## 2021-08-22 DIAGNOSIS — Z91048 Other nonmedicinal substance allergy status: Secondary | ICD-10-CM

## 2021-08-22 DIAGNOSIS — Z91018 Allergy to other foods: Secondary | ICD-10-CM | POA: Diagnosis not present

## 2021-08-22 DIAGNOSIS — N10 Acute tubulo-interstitial nephritis: Secondary | ICD-10-CM | POA: Diagnosis not present

## 2021-08-22 DIAGNOSIS — Z8616 Personal history of COVID-19: Secondary | ICD-10-CM | POA: Diagnosis not present

## 2021-08-22 DIAGNOSIS — F3181 Bipolar II disorder: Secondary | ICD-10-CM | POA: Diagnosis not present

## 2021-08-22 DIAGNOSIS — Z880 Allergy status to penicillin: Secondary | ICD-10-CM | POA: Diagnosis not present

## 2021-08-22 DIAGNOSIS — Z79899 Other long term (current) drug therapy: Secondary | ICD-10-CM

## 2021-08-22 DIAGNOSIS — Z87891 Personal history of nicotine dependence: Secondary | ICD-10-CM

## 2021-08-22 DIAGNOSIS — E872 Acidosis, unspecified: Secondary | ICD-10-CM | POA: Diagnosis not present

## 2021-08-22 DIAGNOSIS — Z888 Allergy status to other drugs, medicaments and biological substances status: Secondary | ICD-10-CM | POA: Diagnosis not present

## 2021-08-22 DIAGNOSIS — N12 Tubulo-interstitial nephritis, not specified as acute or chronic: Secondary | ICD-10-CM | POA: Diagnosis not present

## 2021-08-22 DIAGNOSIS — A419 Sepsis, unspecified organism: Secondary | ICD-10-CM

## 2021-08-22 DIAGNOSIS — F419 Anxiety disorder, unspecified: Secondary | ICD-10-CM | POA: Diagnosis present

## 2021-08-22 DIAGNOSIS — J45909 Unspecified asthma, uncomplicated: Secondary | ICD-10-CM | POA: Diagnosis not present

## 2021-08-22 DIAGNOSIS — L0231 Cutaneous abscess of buttock: Secondary | ICD-10-CM

## 2021-08-22 DIAGNOSIS — R109 Unspecified abdominal pain: Secondary | ICD-10-CM | POA: Diagnosis not present

## 2021-08-22 DIAGNOSIS — R079 Chest pain, unspecified: Secondary | ICD-10-CM

## 2021-08-22 DIAGNOSIS — Z91040 Latex allergy status: Secondary | ICD-10-CM | POA: Diagnosis not present

## 2021-08-22 DIAGNOSIS — F39 Unspecified mood [affective] disorder: Secondary | ICD-10-CM | POA: Diagnosis not present

## 2021-08-22 LAB — BASIC METABOLIC PANEL
Anion gap: 18 — ABNORMAL HIGH (ref 5–15)
BUN: 10 mg/dL (ref 6–20)
CO2: 15 mmol/L — ABNORMAL LOW (ref 22–32)
Calcium: 9.5 mg/dL (ref 8.9–10.3)
Chloride: 103 mmol/L (ref 98–111)
Creatinine, Ser: 0.79 mg/dL (ref 0.44–1.00)
GFR, Estimated: >60 mL/min (ref >60)
Glucose, Bld: 75 mg/dL (ref 70–99)
Potassium: 3.7 mmol/L (ref 3.5–5.1)
Sodium: 136 mmol/L (ref 135–145)

## 2021-08-22 LAB — CBC
HCT: 44.3 % (ref 36.0–46.0)
Hemoglobin: 14.6 g/dL (ref 12.0–15.0)
MCH: 31.7 pg (ref 26.0–34.0)
MCHC: 33 g/dL (ref 30.0–36.0)
MCV: 96.1 fL (ref 80.0–100.0)
Platelets: 299 10*3/uL (ref 150–400)
RBC: 4.61 MIL/uL (ref 3.87–5.11)
RDW: 12.7 % (ref 11.5–15.5)
WBC: 13.3 10*3/uL — ABNORMAL HIGH (ref 4.0–10.5)
nRBC: 0 % (ref 0.0–0.2)

## 2021-08-22 LAB — URINALYSIS, ROUTINE W REFLEX MICROSCOPIC
Bilirubin Urine: NEGATIVE
Glucose, UA: NEGATIVE mg/dL
Ketones, ur: 80 mg/dL — AB
Leukocytes,Ua: NEGATIVE
Nitrite: NEGATIVE
Protein, ur: NEGATIVE mg/dL
Specific Gravity, Urine: >1.046 — ABNORMAL HIGH (ref 1.005–1.030)
pH: 5 (ref 5.0–8.0)

## 2021-08-22 LAB — RAPID URINE DRUG SCREEN, HOSP PERFORMED
Amphetamines: NOT DETECTED
Barbiturates: NOT DETECTED
Benzodiazepines: NOT DETECTED
Cocaine: NOT DETECTED
Opiates: POSITIVE — AB
Tetrahydrocannabinol: POSITIVE — AB

## 2021-08-22 LAB — I-STAT CHEM 8, ED
BUN: 10 mg/dL (ref 6–20)
Calcium, Ion: 1.12 mmol/L — ABNORMAL LOW (ref 1.15–1.40)
Chloride: 107 mmol/L (ref 98–111)
Creatinine, Ser: 0.5 mg/dL (ref 0.44–1.00)
Glucose, Bld: 80 mg/dL (ref 70–99)
HCT: 43 % (ref 36.0–46.0)
Hemoglobin: 14.6 g/dL (ref 12.0–15.0)
Potassium: 3.4 mmol/L — ABNORMAL LOW (ref 3.5–5.1)
Sodium: 137 mmol/L (ref 135–145)
TCO2: 16 mmol/L — ABNORMAL LOW (ref 22–32)

## 2021-08-22 LAB — TROPONIN I (HIGH SENSITIVITY)
Troponin I (High Sensitivity): 4 ng/L (ref <18)
Troponin I (High Sensitivity): 10 ng/L (ref <18)

## 2021-08-22 LAB — HIV ANTIBODY (ROUTINE TESTING W REFLEX): HIV Screen 4th Generation wRfx: NONREACTIVE

## 2021-08-22 LAB — D-DIMER, QUANTITATIVE: D-Dimer, Quant: 0.52 ug/mL-FEU — ABNORMAL HIGH (ref 0.00–0.50)

## 2021-08-22 LAB — I-STAT BETA HCG BLOOD, ED (MC, WL, AP ONLY): I-stat hCG, quantitative: <5 m[IU]/mL (ref <5)

## 2021-08-22 MED ORDER — ENOXAPARIN SODIUM 40 MG/0.4ML IJ SOSY
40.0000 mg | PREFILLED_SYRINGE | INTRAMUSCULAR | Status: DC
  Administered 2021-08-22 – 2021-08-23 (×2): 40 mg via SUBCUTANEOUS
  Filled 2021-08-22 (×2): qty 0.4

## 2021-08-22 MED ORDER — HYDROCORTISONE 1 % EX CREA: TOPICAL_CREAM | Freq: Two times a day (BID) | CUTANEOUS | Status: DC | PRN

## 2021-08-22 MED ORDER — MORPHINE SULFATE (PF) 2 MG/ML IV SOLN: 2.0000 mg | INTRAVENOUS | Status: DC | PRN

## 2021-08-22 MED ORDER — LORAZEPAM 2 MG/ML IJ SOLN
0.5000 mg | Freq: Once | INTRAMUSCULAR | Status: AC
  Administered 2021-08-22: 0.5 mg via INTRAVENOUS
  Filled 2021-08-22: qty 1

## 2021-08-22 MED ORDER — ACETAMINOPHEN 650 MG RE SUPP: 650.0000 mg | Freq: Four times a day (QID) | RECTAL | Status: DC | PRN

## 2021-08-22 MED ORDER — SODIUM CHLORIDE 0.9 % IV SOLN
1.0000 g | INTRAVENOUS | Status: DC
  Administered 2021-08-23 – 2021-08-24 (×2): 1 g via INTRAVENOUS
  Filled 2021-08-22 (×2): qty 10

## 2021-08-22 MED ORDER — ACETAMINOPHEN 325 MG PO TABS
650.0000 mg | ORAL_TABLET | Freq: Four times a day (QID) | ORAL | Status: DC | PRN
  Filled 2021-08-22: qty 2

## 2021-08-22 MED ORDER — IPRATROPIUM-ALBUTEROL 0.5-2.5 (3) MG/3ML IN SOLN
3.0000 mL | Freq: Once | RESPIRATORY_TRACT | Status: AC
  Administered 2021-08-22: 3 mL via RESPIRATORY_TRACT
  Filled 2021-08-22: qty 3

## 2021-08-22 MED ORDER — MORPHINE SULFATE (PF) 2 MG/ML IV SOLN
2.0000 mg | INTRAVENOUS | Status: DC | PRN
  Administered 2021-08-22 – 2021-08-24 (×7): 2 mg via INTRAVENOUS
  Filled 2021-08-22 (×7): qty 1

## 2021-08-22 MED ORDER — SODIUM CHLORIDE 0.9 % IV BOLUS
1000.0000 mL | Freq: Once | INTRAVENOUS | Status: AC
  Administered 2021-08-22: 1000 mL via INTRAVENOUS

## 2021-08-22 MED ORDER — IOHEXOL 350 MG/ML SOLN
100.0000 mL | Freq: Once | INTRAVENOUS | Status: AC | PRN
  Administered 2021-08-22: 100 mL via INTRAVENOUS

## 2021-08-22 MED ORDER — FLUTICASONE PROPIONATE 50 MCG/ACT NA SUSP
2.0000 | Freq: Every day | NASAL | Status: DC | PRN
Start: 2021-08-22 — End: 2021-08-24

## 2021-08-22 MED ORDER — SODIUM CHLORIDE 0.9% FLUSH
3.0000 mL | Freq: Two times a day (BID) | INTRAVENOUS | Status: DC
  Administered 2021-08-22 – 2021-08-24 (×4): 3 mL via INTRAVENOUS

## 2021-08-22 MED ORDER — SUMATRIPTAN SUCCINATE 100 MG PO TABS
100.0000 mg | ORAL_TABLET | ORAL | Status: DC | PRN
  Administered 2021-08-23 – 2021-08-24 (×2): 100 mg via ORAL
  Filled 2021-08-22 (×4): qty 1

## 2021-08-22 MED ORDER — SODIUM CHLORIDE 0.9 % IV SOLN
1.0000 g | Freq: Once | INTRAVENOUS | Status: AC
  Administered 2021-08-22: 1 g via INTRAVENOUS
  Filled 2021-08-22: qty 10

## 2021-08-22 MED ORDER — ONDANSETRON HCL 4 MG PO TABS: 4.0000 mg | ORAL_TABLET | Freq: Four times a day (QID) | ORAL | Status: DC | PRN

## 2021-08-22 MED ORDER — ALBUTEROL SULFATE (2.5 MG/3ML) 0.083% IN NEBU: 2.5000 mg | INHALATION_SOLUTION | Freq: Four times a day (QID) | RESPIRATORY_TRACT | Status: DC | PRN

## 2021-08-22 MED ORDER — ONDANSETRON HCL 4 MG/2ML IJ SOLN
4.0000 mg | Freq: Four times a day (QID) | INTRAMUSCULAR | Status: DC | PRN
  Administered 2021-08-22 – 2021-08-23 (×3): 4 mg via INTRAVENOUS
  Filled 2021-08-22 (×4): qty 2

## 2021-08-22 MED ORDER — HYDRALAZINE HCL 20 MG/ML IJ SOLN: 10.0000 mg | INTRAMUSCULAR | Status: DC | PRN

## 2021-08-22 MED ORDER — HYDROCODONE-ACETAMINOPHEN 5-325 MG PO TABS
1.0000 | ORAL_TABLET | Freq: Four times a day (QID) | ORAL | Status: DC | PRN
  Administered 2021-08-22 – 2021-08-23 (×3): 1 via ORAL
  Filled 2021-08-22 (×3): qty 1

## 2021-08-22 MED ORDER — LORATADINE 10 MG PO TABS: 10.0000 mg | ORAL_TABLET | Freq: Every day | ORAL | Status: DC | PRN

## 2021-08-22 MED ORDER — MORPHINE SULFATE (PF) 4 MG/ML IV SOLN
4.0000 mg | Freq: Once | INTRAVENOUS | Status: AC
  Administered 2021-08-22: 4 mg via INTRAVENOUS
  Filled 2021-08-22: qty 1

## 2021-08-22 MED ORDER — METRONIDAZOLE 500 MG/100ML IV SOLN
500.0000 mg | Freq: Two times a day (BID) | INTRAVENOUS | Status: DC
  Administered 2021-08-22 – 2021-08-23 (×2): 500 mg via INTRAVENOUS
  Filled 2021-08-22 (×2): qty 100

## 2021-08-22 NOTE — ED Notes (Signed)
Dietary called for meal tray

## 2021-08-23 ENCOUNTER — Other Ambulatory Visit: Payer: Self-pay

## 2021-08-23 DIAGNOSIS — N12 Tubulo-interstitial nephritis, not specified as acute or chronic: Secondary | ICD-10-CM | POA: Diagnosis not present

## 2021-08-23 LAB — URINE CULTURE: Culture: >=100000 — AB

## 2021-08-23 LAB — CBC
HCT: 36.1 % (ref 36.0–46.0)
Hemoglobin: 12.3 g/dL (ref 12.0–15.0)
MCH: 32 pg (ref 26.0–34.0)
MCHC: 34.1 g/dL (ref 30.0–36.0)
MCV: 94 fL (ref 80.0–100.0)
Platelets: 228 10*3/uL (ref 150–400)
RBC: 3.84 MIL/uL — ABNORMAL LOW (ref 3.87–5.11)
RDW: 12.7 % (ref 11.5–15.5)
WBC: 7.5 10*3/uL (ref 4.0–10.5)
nRBC: 0 % (ref 0.0–0.2)

## 2021-08-23 LAB — BASIC METABOLIC PANEL
Anion gap: 9 (ref 5–15)
BUN: 5 mg/dL — ABNORMAL LOW (ref 6–20)
CO2: 20 mmol/L — ABNORMAL LOW (ref 22–32)
Calcium: 8.8 mg/dL — ABNORMAL LOW (ref 8.9–10.3)
Chloride: 107 mmol/L (ref 98–111)
Creatinine, Ser: 0.64 mg/dL (ref 0.44–1.00)
GFR, Estimated: >60 mL/min (ref >60)
Glucose, Bld: 76 mg/dL (ref 70–99)
Potassium: 3.5 mmol/L (ref 3.5–5.1)
Sodium: 136 mmol/L (ref 135–145)

## 2021-08-23 MED ORDER — METOCLOPRAMIDE HCL 5 MG/ML IJ SOLN
5.0000 mg | Freq: Four times a day (QID) | INTRAMUSCULAR | Status: DC | PRN
  Administered 2021-08-23: 5 mg via INTRAVENOUS
  Filled 2021-08-23: qty 2

## 2021-08-24 DIAGNOSIS — N12 Tubulo-interstitial nephritis, not specified as acute or chronic: Secondary | ICD-10-CM | POA: Diagnosis not present

## 2021-08-24 LAB — CBC
HCT: 35.4 % — ABNORMAL LOW (ref 36.0–46.0)
Hemoglobin: 12 g/dL (ref 12.0–15.0)
MCH: 31.7 pg (ref 26.0–34.0)
MCHC: 33.9 g/dL (ref 30.0–36.0)
MCV: 93.4 fL (ref 80.0–100.0)
Platelets: 242 10*3/uL (ref 150–400)
RBC: 3.79 MIL/uL — ABNORMAL LOW (ref 3.87–5.11)
RDW: 12.5 % (ref 11.5–15.5)
WBC: 7.7 10*3/uL (ref 4.0–10.5)
nRBC: 0 % (ref 0.0–0.2)

## 2021-08-24 LAB — BASIC METABOLIC PANEL
Anion gap: 11 (ref 5–15)
BUN: <5 mg/dL — ABNORMAL LOW (ref 6–20)
CO2: 22 mmol/L (ref 22–32)
Calcium: 9.1 mg/dL (ref 8.9–10.3)
Chloride: 105 mmol/L (ref 98–111)
Creatinine, Ser: 0.67 mg/dL (ref 0.44–1.00)
GFR, Estimated: >60 mL/min (ref >60)
Glucose, Bld: 74 mg/dL (ref 70–99)
Potassium: 3.6 mmol/L (ref 3.5–5.1)
Sodium: 138 mmol/L (ref 135–145)

## 2021-08-24 LAB — MAGNESIUM: Magnesium: 1.7 mg/dL (ref 1.7–2.4)

## 2021-08-24 MED ORDER — ONDANSETRON 4 MG PO TBDP: 4.0000 mg | ORAL_TABLET | Freq: Three times a day (TID) | ORAL | 0 refills | Status: AC | PRN

## 2021-08-24 MED ORDER — CIPROFLOXACIN HCL 500 MG PO TABS: 500.0000 mg | ORAL_TABLET | Freq: Two times a day (BID) | ORAL | 0 refills | Status: AC

## 2021-08-24 MED ORDER — HYDROCODONE-ACETAMINOPHEN 5-325 MG PO TABS
1.0000 | ORAL_TABLET | Freq: Four times a day (QID) | ORAL | 0 refills | Status: AC | PRN
Start: 2021-08-24 — End: 2021-08-29

## 2021-08-24 NOTE — TOC Transition Note (Signed)
Transition of Care [facility]) - CM/SW Discharge Note   Patient Details  Name: SHAHIDAH HANIF MRN: [id number] Date of Birth: 02-02-1986  [facility] CM/SW Contact:  Bess Kinds, RN Phone Number: [phone] 08/24/2021, 1:05 PM   Clinical Narrative:     Patient to transition home today. Patient has PCP and insurance that includes prescription plan with copays $4 or less. No TOC need identified at this time. Spoke with patient on hospital room phone to discuss transition plan. She has transportation home, and will schedule f/u with PCP this week. Advised of where to pick up medications and likely cost. No further TOC needs identified at this time.   Final next level of care: Home/Self Care Barriers to Discharge: No Barriers Identified   Patient Goals and CMS Choice        Discharge Placement                       Discharge Plan and Services                                     Social Determinants of Health (SDOH) Interventions     Readmission Risk Interventions     No data to display

## 2021-08-24 NOTE — Discharge Summary (Signed)
Physician Discharge Summary  Diane Garrison NUU:[id number] DOB: 06-09-1985 DOA: 08/22/2021  PCP: Teena Irani, PA-C  Admit date: 08/22/2021 Discharge date: 08/24/2021  Admitted From: Home Disposition: Home  Recommendations for Outpatient Follow-up:  Follow up with PCP in 1-2 weeks Discharge on ciprofloxacin 500 mg p.o. twice daily to complete 14-day total course of antibiotics for pyelonephritis Please obtain BMP/CBC in one week Recommend repeat CT abdomen/pelvis in 2-4 weeks to ensure interval resolution of pyelonephritis with concern of developing abscess  Home Health: No Equipment/Devices: None  Discharge Condition: Stable CODE STATUS: Full code Diet recommendation: Regular diet  History of present illness:  Diane Garrison is a 36 y.o. female with past medical history significant for bipolar disorder, anxiety, asthma, boils who presented to [facility] ED on 6/23 with generalized ill feeling, nausea/vomiting.  Patient reported that boil on her buttock recently popped and was draining.  Also endorses subjective fever, chills, nausea/vomiting and right flank pain.  Seen in urgent care day prior and diagnosed with UTI and prescription sent to into her pharmacy for Bactrim.  When she called her pharmacy at 7:30 PM, they stated it would not be ready until the following day.  Records notable for urine culture on 6/22 revealing greater than 100,000 colonies/mL of Klebsiella pneumonia.  Upon awakening this morning, she felt worse with complaints of chest discomfort and right-sided flank pain in which she presents to the ED for further evaluation.   In the ED, temperature 98.2 F, HR 86, RR 22, BP 113/59, SPO2 100% on room air.  WBC 13.3, hemoglobin 14.6, platelets 299.  Sodium 136, potassium 3.7, chloride 103, CO2 15, glucose 75, BUN 10, creatinine 0.79.  High sensitive troponin 4.  D-dimer 0.52.  hCG negative.  Urinalysis with large hemoglobin, 80 ketones, rare bacteria, 6-10 WBCs.  UDS positive  for opiates and THC.  Chest x-ray with no acute process in the chest.  CT angiogram chest PE with and without contrast with no findings for pulmonary embolism, mild/early emphysematous changes but no acute pulmonary findings or worrisome pulmonary lesions.  CT abdomen/pelvis with contrast with abnormal appearance of the right kidney suggesting acute pyelonephritis with focal hypodensity posterior cortex suspicious for renal abscess/developing abscess.  Patient was given 1 L NS bolus, IV morphine, IV Ativan, DuoNeb and started on ceftriaxone.  TRH consulted for further evaluation and management of acute pyelonephritis with possible developing abscess.  Hospital course:  Acute right pyelonephritis Patient presenting with progressive nausea/vomiting, generalized weakness and ill feeling.  Recently diagnosed day prior with UTI but was unable to obtain antibiotic that was prescribed to her pharmacy.  Urine culture on 6/22 significant for Klebsiella pneumonia.  CT abdomen/pelvis with findings suggestive of acute pyelonephritis including a focal hypodensity in the posterior cortex suspicious for renal abscess/developing abscess.  Patient was tachypneic, tachycardic with elevated WBC count of 13.3 on admission; but no endorgan damage which sepsis is ruled out.  Patient was started on empiric antibiotics with ceftriaxone and metronidazole.  WBC count improved from 13.3-7.7 at time of discharge.  Blood cultures remained with no growth during hospitalization.  Urine culture on 08/21/2021 positive for Klebsiella pneumonia and will discharge on ciprofloxacin 500 mg p.o. twice daily based on culture susceptibilities for complete course of 14 days.  Recommend repeat CT abdomen/pelvis in 2-4 weeks to ensure interval resolution of pyelonephritis.   Gluteal abscess Present prior to arrival.  Patient noted abscess that popped and draining for the last 3 days.  Recommend daily sitz bath's  Chest pain On admission patient  complaining of chest discomfort, EKG without significant ischemic changes, high sensitive troponin negative x2.  CT angiogram chest negative for PE or acute pulmonary findings.  Etiology likely noncardiac.  No concerning arrhythmias noted on telemetry during hospitalization.   Metabolic acidosis with increased anion gap: Resolved CO2 15 with anion gap of 18 admission, unclear etiology but likely suspicious for her intractable nausea/vomiting.  Now resolved.  Serum CO2 level 22 at time of discharge.   Mood disorder Patient with history of anxiety and bipolar disorder.  Outpatient regimen includes Zoloft and Wellbutrin, but do not appear to have been recently filled.  Outpatient follow-up with PCP.   Elevated D-dimer D-dimer elevated 0.52, CTA chest PE study negative for pulmonary embolism.  Likely reactive in the setting of active infection as above.  Discharge Diagnoses:  Principal Problem:   Pyelonephritis Active Problems:   Sepsis (HCC)   Gluteal abscess   Chest pain   Metabolic acidosis with increased anion gap and accumulation of organic acids   Mood disorder (HCC)   Elevated d-dimer    Discharge Instructions  Discharge Instructions     Call MD for:  difficulty breathing, headache or visual disturbances   Complete by: As directed    Call MD for:  extreme fatigue   Complete by: As directed    Call MD for:  persistant dizziness or light-headedness   Complete by: As directed    Call MD for:  persistant nausea and vomiting   Complete by: As directed    Call MD for:  severe uncontrolled pain   Complete by: As directed    Call MD for:  temperature >100.4   Complete by: As directed    Diet - low sodium heart healthy   Complete by: As directed    Increase activity slowly   Complete by: As directed       Allergies as of 08/24/2021       Reactions   Coconut (cocos Nucifera) Swelling   Tape Rash   Adhesive tape allergy.  Paper tape is okay.   Benadryl [diphenhydramine]  Swelling   Tongue swelled   Promethazine Nausea And Vomiting   Ampicillin Rash   Did it involve swelling of the face/tongue/throat, SOB, or low BP? Unknown Did it involve sudden or severe rash/hives, skin peeling, or any reaction on the inside of your mouth or nose? Unknown Did you need to seek medical attention at a hospital or doctor's office? No When did it last happen?      9 + year If all above answers are "NO", may proceed with cephalosporin use. Pt was given ampicillin in hospital, was asleep when she reacted to it.   Latex Rash        Medication List     STOP taking these medications    sulfamethoxazole-trimethoprim 800-160 MG tablet Commonly known as: BACTRIM DS       TAKE these medications    Advair HFA 45-21 MCG/ACT inhaler Generic drug: fluticasone-salmeterol Inhale 2 puffs into the lungs 2 (two) times daily.   albuterol 108 (90 Base) MCG/ACT inhaler Commonly known as: VENTOLIN HFA Inhale 2 puffs into the lungs every 6 (six) hours as needed for wheezing or shortness of breath. For shortness of breath.   buPROPion 150 MG 24 hr tablet Commonly known as: WELLBUTRIN XL Take 150 mg by mouth daily.   cetirizine 10 MG tablet Commonly known as: ZYRTEC Take 10 mg by mouth at bedtime.  ciprofloxacin 500 MG tablet Commonly known as: Cipro Take 1 tablet (500 mg total) by mouth 2 (two) times daily for 12 days.   fluticasone 50 MCG/ACT nasal spray Commonly known as: FLONASE Place 2 sprays into both nostrils daily. What changed:  when to take this reasons to take this   HYDROcodone-acetaminophen 5-325 MG tablet Commonly known as: NORCO/VICODIN Take 1 tablet by mouth every 6 (six) hours as needed for up to 5 days for moderate pain.   ondansetron 4 MG disintegrating tablet Commonly known as: ZOFRAN-ODT Take 1 tablet (4 mg total) by mouth every 8 (eight) hours as needed for nausea or vomiting.   sertraline 100 MG tablet Commonly known as: ZOLOFT Take 100 mg  by mouth at bedtime.   SUMAtriptan 100 MG tablet Commonly known as: IMITREX Take 100 mg by mouth every 2 (two) hours as needed for migraine. May repeat in 2 hours if headache persists or recurs.        Follow-up Information     Teena Irani, PA-C. Schedule an appointment as soon as possible for a visit in 1 week(s).   Specialty: Physician Assistant Contact information: [address] [place] [address] [phone]                Allergies  Allergen Reactions   Coconut (Cocos Nucifera) Swelling   Tape Rash    Adhesive tape allergy.  Paper tape is okay.   Benadryl [Diphenhydramine] Swelling    Tongue swelled   Promethazine Nausea And Vomiting   Ampicillin Rash    Did it involve swelling of the face/tongue/throat, SOB, or low BP? Unknown Did it involve sudden or severe rash/hives, skin peeling, or any reaction on the inside of your mouth or nose? Unknown Did you need to seek medical attention at a hospital or doctor's office? No When did it last happen?      9 + year If all above answers are "NO", may proceed with cephalosporin use. Pt was given ampicillin in hospital, was asleep when she reacted to it.    Latex Rash    Consultations: None   Procedures/Studies: CT ABDOMEN PELVIS W CONTRAST  Result Date: 08/22/2021 CLINICAL DATA:  Flank pain EXAM: CT ABDOMEN AND PELVIS WITH CONTRAST TECHNIQUE: Multidetector CT imaging of the abdomen and pelvis was performed using the standard protocol following bolus administration of intravenous contrast. RADIATION DOSE REDUCTION: This exam was performed according to the departmental dose-optimization program which includes automated exposure control, adjustment of the mA and/or kV according to patient size and/or use of iterative reconstruction technique. CONTRAST:  OMNIPAQUE IOHEXOL 350 MG/ML SOLN COMPARISON:  None Available. FINDINGS: Lower chest: Subsegmental atelectatic changes in the lung bases. Hepatobiliary:  Liver is enlarged measuring 20.5 cm in length. Ill-defined hypodensity adjacent to the falciform ligament likely represents focal fatty infiltration. Gallbladder is surgically absent. No biliary ductal dilatation identified. Pancreas: Unremarkable. No pancreatic ductal dilatation or surrounding inflammatory changes. Spleen: Normal in size without focal abnormality. Adrenals/Urinary Tract: Adrenal glands are normal. Heterogeneous perfusion of the right kidney as well as a 1.5 cm focal ill-defined hypodensity in the midpole posterior cortex. Minimal right perinephric fat stranding. Left kidney appears normal. No hydronephrosis or hydroureter identified bilaterally. Urinary bladder appears normal. Stomach/Bowel: Stomach is within normal limits. Appendix appears normal. No evidence of bowel wall thickening, distention, or inflammatory changes. Vascular/Lymphatic: No significant vascular findings are present. No enlarged abdominal or pelvic lymph nodes. Reproductive: Status post hysterectomy. No adnexal masses. Other: No ascites.  Musculoskeletal: No acute or significant osseous findings. IMPRESSION: 1. Abnormal appearance of the right kidney suggesting acute pyelonephritis, including a focal hypodensity in the posterior cortex which is suspicious for renal abscess/developing abscess. 2. Hepatomegaly. Electronically Signed   By: Jannifer Hick M.D.   On: 08/22/2021 13:30   CT Angio Chest PE W and/or Wo Contrast  Result Date: 08/22/2021 CLINICAL DATA:  Chest pain.  Positive D-dimer. EXAM: CT ANGIOGRAPHY CHEST WITH CONTRAST TECHNIQUE: Multidetector CT imaging of the chest was performed using the standard protocol during bolus administration of intravenous contrast. Multiplanar CT image reconstructions and MIPs were obtained to evaluate the vascular anatomy. RADIATION DOSE REDUCTION: This exam was performed according to the departmental dose-optimization program which includes automated exposure control, adjustment of  the mA and/or kV according to patient size and/or use of iterative reconstruction technique. CONTRAST:  OMNIPAQUE IOHEXOL 350 MG/ML SOLN COMPARISON:  Foot FINDINGS: Cardiovascular: The heart is normal in size. No pericardial effusion. The aorta is normal in caliber. No dissection. No atherosclerotic calcifications. The branch vessels are patent. The pulmonary arterial tree is suboptimally opacified but no definite filling defects to suggest pulmonary embolism. Mediastinum/Nodes: No mediastinal or hilar mass or lymphadenopathy. Residual thymic tissue noted in the anterior mediastinum. The esophagus is unremarkable. Lungs/Pleura: No acute pulmonary findings. Mild/early emphysematous changes. No pulmonary lesions or pulmonary nodules. No pleural effusion or pleural nodules. Upper Abdomen: No significant upper abdominal findings. Musculoskeletal: No significant bony findings. Review of the MIP images confirms the above findings. IMPRESSION: 1. Suboptimal opacification of the pulmonary arteries but no CT findings for pulmonary embolism. 2. Normal thoracic aorta. 3. Mild/early emphysematous changes but no acute pulmonary findings or worrisome pulmonary lesions. Aortic Atherosclerosis (ICD10-I70.0) and Emphysema (ICD10-J43.9). Electronically Signed   By: Rudie Meyer M.D.   On: 08/22/2021 13:29   DG Chest Portable 1 View  Result Date: 08/22/2021 CLINICAL DATA:  Chest pain EXAM: PORTABLE CHEST 1 VIEW COMPARISON:  None Available. FINDINGS: The heart size and mediastinal contours are within normal limits. Both lungs are clear. No pleural effusion or pneumothorax. The visualized skeletal structures are unremarkable. IMPRESSION: No acute process in the chest. Electronically Signed   By: Guadlupe Spanish M.D.   On: 08/22/2021 10:57     Subjective: Patient seen examined bedside, resting comfortably.  Lying in bed.  Family present.  Right flank/back discomfort improved.  Remains afebrile and leukocytosis now resolved.   Tolerating diet with mild nausea.  Discussed will discharge home on oral antibiotics based on culture identification/susceptibilities.  No other questions or concerns at this time.  Denies headache, no dizziness, no chest pain, no palpitations, no shortness of breath, no focal weakness, no fever/chills/night sweats, no vomiting/diarrhea, no cough/congestion, no paresthesias.  No acute events overnight per nursing staff.  Discharge Exam: Vitals:   08/24/21 0513 08/24/21 0847  BP: 124/64 123/73  Pulse: 77 73  Resp: 18 17  Temp: 98.8 F (37.1 C) 99.1 F (37.3 C)  SpO2: 100% 99%   Vitals:   08/23/21 2027 08/23/21 2250 08/24/21 0513 08/24/21 0847  BP: (!) 96/49 134/72 124/64 123/73  Pulse: (!) 59 (!) 56 77 73  Resp: 19  18 17   Temp: 98.8 F (37.1 C)  98.8 F (37.1 C) 99.1 F (37.3 C)  TempSrc: Oral  Oral Oral  SpO2: 100%  100% 99%    Physical Exam: GEN: NAD, alert and oriented x 3, wd/wn HEENT: NCAT, PERRL, EOMI, sclera clear, MMM PULM: CTAB w/o wheezes/crackles, normal respiratory effort, on  room air CV: RRR w/o M/G/R, + mild CVA TTP GI: abd soft, nondistended, nontender to palpation, NABS, no R/G/M MSK: no peripheral edema, muscle strength globally intact 5/5 bilateral upper/lower extremities NEURO: CN II-XII intact, no focal deficits, sensation to light touch intact PSYCH: normal mood/affect Integumentary: Gluteal wound noted that is draining, otherwise no other concerning rashes/lesions/wounds     The results of significant diagnostics from this hospitalization (including imaging, microbiology, ancillary and laboratory) are listed below for reference.     Microbiology: Recent Results (from the past 240 hour(s))  Urine Culture     Status: Abnormal   Collection Time: 08/21/21  5:18 PM   Specimen: Urine, Clean Catch  Result Value Ref Range Status   Specimen Description URINE, CLEAN CATCH  Final   Special Requests   Final    NONE Performed at [facility],  [address] [place], [place] [address]    Culture >=100,000 COLONIES/mL KLEBSIELLA PNEUMONIAE (A)  Final   Report Status 08/23/2021 FINAL  Final   Organism ID, Bacteria KLEBSIELLA PNEUMONIAE (A)  Final      Susceptibility   Klebsiella pneumoniae - MIC*    AMPICILLIN >=32 RESISTANT Resistant     CEFAZOLIN <=4 SENSITIVE Sensitive     CEFEPIME <=0.12 SENSITIVE Sensitive     CEFTRIAXONE <=0.25 SENSITIVE Sensitive     CIPROFLOXACIN <=0.25 SENSITIVE Sensitive     GENTAMICIN <=1 SENSITIVE Sensitive     IMIPENEM <=0.25 SENSITIVE Sensitive     NITROFURANTOIN 64 INTERMEDIATE Intermediate     TRIMETH/SULFA <=20 SENSITIVE Sensitive     AMPICILLIN/SULBACTAM 4 SENSITIVE Sensitive     PIP/TAZO <=4 SENSITIVE Sensitive     * >=100,000 COLONIES/mL KLEBSIELLA PNEUMONIAE  Culture, blood (Routine X 2) w Reflex to ID Panel     Status: None (Preliminary result)   Collection Time: 08/22/21  6:23 PM   Specimen: BLOOD LEFT ARM  Result Value Ref Range Status   Specimen Description BLOOD LEFT ARM  Final   Special Requests   Final    BOTTLES DRAWN AEROBIC AND ANAEROBIC Blood Culture adequate volume   Culture   Final    NO GROWTH 2 DAYS Performed at [facility], [address] [place], [place] [address]    Report Status PENDING  Incomplete     Labs: BNP (last 3 results) No results for input(s): "BNP" in the last 8760 hours. Basic Metabolic Panel: Recent Labs  Lab 08/22/21 0949 08/22/21 1041 08/23/21 0137 08/24/21 0233  NA 136 137 136 138  K 3.7 3.4* 3.5 3.6  CL 103 107 107 105  CO2 15*  --  20* 22  GLUCOSE 75 80 76 74  BUN 10 10 5* <5*  CREATININE 0.79 0.50 0.64 0.67  CALCIUM 9.5  --  8.8* 9.1  MG  --   --   --  1.7   Liver Function Tests: No results for input(s): "AST", "ALT", "ALKPHOS", "BILITOT", "PROT", "ALBUMIN" in the last 168 hours. No results for input(s): "LIPASE", "AMYLASE" in the last 168 hours. No results for input(s): "AMMONIA" in the last 168 hours. CBC: Recent Labs   Lab 08/22/21 0949 08/22/21 1041 08/23/21 0137 08/24/21 0233  WBC 13.3*  --  7.5 7.7  HGB 14.6 14.6 12.3 12.0  HCT 44.3 43.0 36.1 35.4*  MCV 96.1  --  94.0 93.4  PLT 299  --  228 242   Cardiac Enzymes: No results for input(s): "CKTOTAL", "CKMB", "CKMBINDEX", "TROPONINI" in the last 168  hours. BNP: Invalid input(s): "POCBNP" CBG: No results for input(s): "GLUCAP" in the last 168 hours. D-Dimer Recent Labs    08/22/21 1030  DDIMER 0.52*   Hgb A1c No results for input(s): "HGBA1C" in the last 72 hours. Lipid Profile No results for input(s): "CHOL", "HDL", "LDLCALC", "TRIG", "CHOLHDL", "LDLDIRECT" in the last 72 hours. Thyroid function studies No results for input(s): "TSH", "T4TOTAL", "T3FREE", "THYROIDAB" in the last 72 hours.  Invalid input(s): "FREET3" Anemia work up No results for input(s): "VITAMINB12", "FOLATE", "FERRITIN", "TIBC", "IRON", "RETICCTPCT" in the last 72 hours. Urinalysis    Component Value Date/Time   COLORURINE YELLOW 08/22/2021 1352   APPEARANCEUR CLEAR 08/22/2021 1352   LABSPEC >1.046 (H) 08/22/2021 1352   PHURINE 5.0 08/22/2021 1352   GLUCOSEU NEGATIVE 08/22/2021 1352   HGBUR LARGE (A) 08/22/2021 1352   BILIRUBINUR NEGATIVE 08/22/2021 1352   BILIRUBINUR NEGATIVE 02/13/2013 1351   KETONESUR 80 (A) 08/22/2021 1352   PROTEINUR NEGATIVE 08/22/2021 1352   UROBILINOGEN 0.2 08/21/2021 1706   NITRITE NEGATIVE 08/22/2021 1352   LEUKOCYTESUR NEGATIVE 08/22/2021 1352   Sepsis Labs Recent Labs  Lab 08/22/21 0949 08/23/21 0137 08/24/21 0233  WBC 13.3* 7.5 7.7   Microbiology Recent Results (from the past 240 hour(s))  Urine Culture     Status: Abnormal   Collection Time: 08/21/21  5:18 PM   Specimen: Urine, Clean Catch  Result Value Ref Range Status   Specimen Description URINE, CLEAN CATCH  Final   Special Requests   Final    NONE Performed at [facility], [address] [place], [place] [address]    Culture >=100,000 COLONIES/mL  KLEBSIELLA PNEUMONIAE (A)  Final   Report Status 08/23/2021 FINAL  Final   Organism ID, Bacteria KLEBSIELLA PNEUMONIAE (A)  Final      Susceptibility   Klebsiella pneumoniae - MIC*    AMPICILLIN >=32 RESISTANT Resistant     CEFAZOLIN <=4 SENSITIVE Sensitive     CEFEPIME <=0.12 SENSITIVE Sensitive     CEFTRIAXONE <=0.25 SENSITIVE Sensitive     CIPROFLOXACIN <=0.25 SENSITIVE Sensitive     GENTAMICIN <=1 SENSITIVE Sensitive     IMIPENEM <=0.25 SENSITIVE Sensitive     NITROFURANTOIN 64 INTERMEDIATE Intermediate     TRIMETH/SULFA <=20 SENSITIVE Sensitive     AMPICILLIN/SULBACTAM 4 SENSITIVE Sensitive     PIP/TAZO <=4 SENSITIVE Sensitive     * >=100,000 COLONIES/mL KLEBSIELLA PNEUMONIAE  Culture, blood (Routine X 2) w Reflex to ID Panel     Status: None (Preliminary result)   Collection Time: 08/22/21  6:23 PM   Specimen: BLOOD LEFT ARM  Result Value Ref Range Status   Specimen Description BLOOD LEFT ARM  Final   Special Requests   Final    BOTTLES DRAWN AEROBIC AND ANAEROBIC Blood Culture adequate volume   Culture   Final    NO GROWTH 2 DAYS Performed at [facility], [address], [place], [place] [address]    Report Status PENDING  Incomplete     Time coordinating discharge: Over 30 minutes  SIGNED:   Alvira Philips [place], DO  [facility] 08/24/2021, 9:38 AM

## 2021-08-27 DIAGNOSIS — N12 Tubulo-interstitial nephritis, not specified as acute or chronic: Secondary | ICD-10-CM | POA: Diagnosis not present

## 2021-08-27 DIAGNOSIS — L0231 Cutaneous abscess of buttock: Secondary | ICD-10-CM | POA: Diagnosis not present

## 2021-08-27 LAB — CULTURE, BLOOD (ROUTINE X 2)
Culture: NO GROWTH
Special Requests: ADEQUATE

## 2021-09-09 DIAGNOSIS — K603 Anal fistula: Secondary | ICD-10-CM | POA: Diagnosis not present

## 2021-09-09 DIAGNOSIS — K610 Anal abscess: Secondary | ICD-10-CM | POA: Diagnosis not present

## 2021-09-11 DIAGNOSIS — K611 Rectal abscess: Secondary | ICD-10-CM | POA: Diagnosis not present

## 2021-09-15 DIAGNOSIS — L0231 Cutaneous abscess of buttock: Secondary | ICD-10-CM | POA: Diagnosis not present

## 2021-09-16 DIAGNOSIS — Z91048 Other nonmedicinal substance allergy status: Secondary | ICD-10-CM | POA: Diagnosis not present

## 2021-09-16 DIAGNOSIS — L0231 Cutaneous abscess of buttock: Secondary | ICD-10-CM | POA: Diagnosis not present

## 2021-09-16 DIAGNOSIS — Z91040 Latex allergy status: Secondary | ICD-10-CM | POA: Diagnosis not present

## 2021-09-16 DIAGNOSIS — Z881 Allergy status to other antibiotic agents status: Secondary | ICD-10-CM | POA: Diagnosis not present

## 2021-09-16 DIAGNOSIS — K5641 Fecal impaction: Secondary | ICD-10-CM | POA: Diagnosis not present

## 2021-09-16 DIAGNOSIS — Z91018 Allergy to other foods: Secondary | ICD-10-CM | POA: Diagnosis not present

## 2021-09-16 DIAGNOSIS — K219 Gastro-esophageal reflux disease without esophagitis: Secondary | ICD-10-CM | POA: Diagnosis not present

## 2021-09-16 DIAGNOSIS — K612 Anorectal abscess: Secondary | ICD-10-CM | POA: Diagnosis not present

## 2021-10-13 DIAGNOSIS — L732 Hidradenitis suppurativa: Secondary | ICD-10-CM | POA: Diagnosis not present

## 2022-01-01 DIAGNOSIS — L732 Hidradenitis suppurativa: Secondary | ICD-10-CM | POA: Diagnosis not present

## 2022-01-01 DIAGNOSIS — Z5181 Encounter for therapeutic drug level monitoring: Secondary | ICD-10-CM | POA: Diagnosis not present

## 2022-01-01 DIAGNOSIS — L089 Local infection of the skin and subcutaneous tissue, unspecified: Secondary | ICD-10-CM | POA: Diagnosis not present

## 2022-05-04 DIAGNOSIS — F5104 Psychophysiologic insomnia: Secondary | ICD-10-CM | POA: Diagnosis not present

## 2022-05-04 DIAGNOSIS — J40 Bronchitis, not specified as acute or chronic: Secondary | ICD-10-CM | POA: Diagnosis not present

## 2022-05-04 DIAGNOSIS — R059 Cough, unspecified: Secondary | ICD-10-CM | POA: Diagnosis not present

## 2022-05-04 DIAGNOSIS — J4540 Moderate persistent asthma, uncomplicated: Secondary | ICD-10-CM | POA: Diagnosis not present

## 2022-05-05 DIAGNOSIS — R059 Cough, unspecified: Secondary | ICD-10-CM | POA: Diagnosis not present

## 2022-05-05 DIAGNOSIS — J40 Bronchitis, not specified as acute or chronic: Secondary | ICD-10-CM | POA: Diagnosis not present

## 2022-05-05 DIAGNOSIS — R918 Other nonspecific abnormal finding of lung field: Secondary | ICD-10-CM | POA: Diagnosis not present

## 2022-11-23 DIAGNOSIS — T7491XA Unspecified adult maltreatment, confirmed, initial encounter: Secondary | ICD-10-CM | POA: Diagnosis not present

## 2022-11-23 DIAGNOSIS — Z5986 Financial insecurity: Secondary | ICD-10-CM | POA: Diagnosis not present

## 2022-12-21 DIAGNOSIS — D84821 Immunodeficiency due to drugs: Secondary | ICD-10-CM | POA: Diagnosis not present

## 2022-12-21 DIAGNOSIS — Z79899 Other long term (current) drug therapy: Secondary | ICD-10-CM | POA: Diagnosis not present

## 2022-12-21 DIAGNOSIS — L700 Acne vulgaris: Secondary | ICD-10-CM | POA: Diagnosis not present

## 2022-12-21 DIAGNOSIS — L732 Hidradenitis suppurativa: Secondary | ICD-10-CM | POA: Diagnosis not present

## 2022-12-27 DIAGNOSIS — F419 Anxiety disorder, unspecified: Secondary | ICD-10-CM | POA: Diagnosis not present

## 2022-12-27 DIAGNOSIS — R251 Tremor, unspecified: Secondary | ICD-10-CM | POA: Diagnosis not present

## 2022-12-29 DIAGNOSIS — R110 Nausea: Secondary | ICD-10-CM | POA: Diagnosis not present

## 2022-12-29 DIAGNOSIS — R251 Tremor, unspecified: Secondary | ICD-10-CM | POA: Diagnosis not present

## 2022-12-29 DIAGNOSIS — E559 Vitamin D deficiency, unspecified: Secondary | ICD-10-CM | POA: Diagnosis not present

## 2023-04-08 IMAGING — US US PELVIS COMPLETE
1 series · 15 of 25 positions shown · non-contrast
Comparison: 07/09/2016 ultrasound

CLINICAL DATA: History of recent labial abscess right ovarian cyst

EXAM:
TRANSABDOMINAL ULTRASOUND OF PELVIS
TECHNIQUE: Transabdominal ultrasound examination of the pelvis was performed
including evaluation of the uterus, ovaries, adnexal regions, and
pelvic cul-de-sac.

[Series 1: us pelvis complete · 15 of 47 slices shown]
[im 1/47]
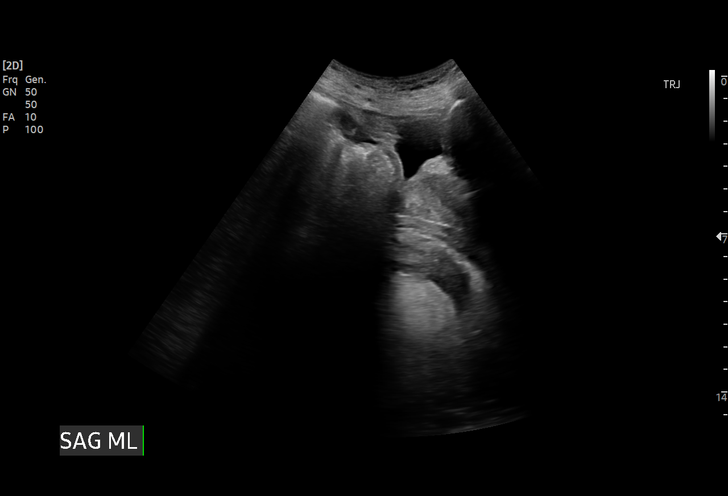
[im 4/47]
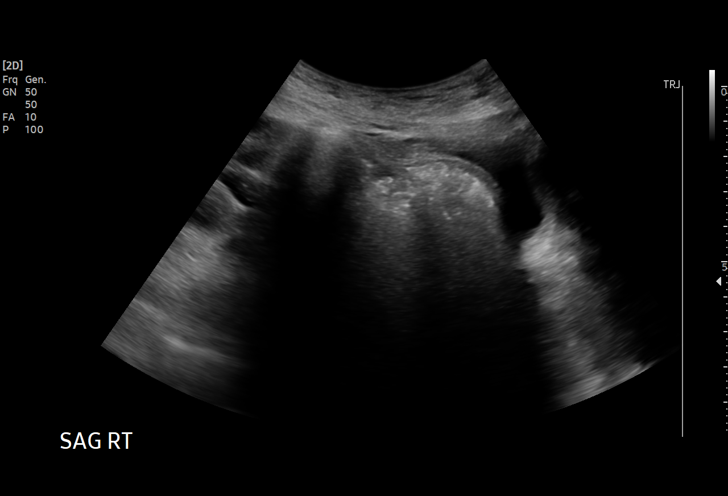
[im 8/47]
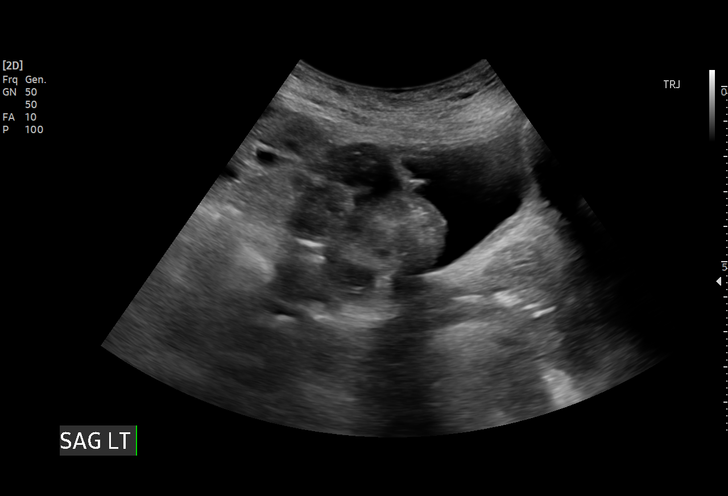
[im 10/47]
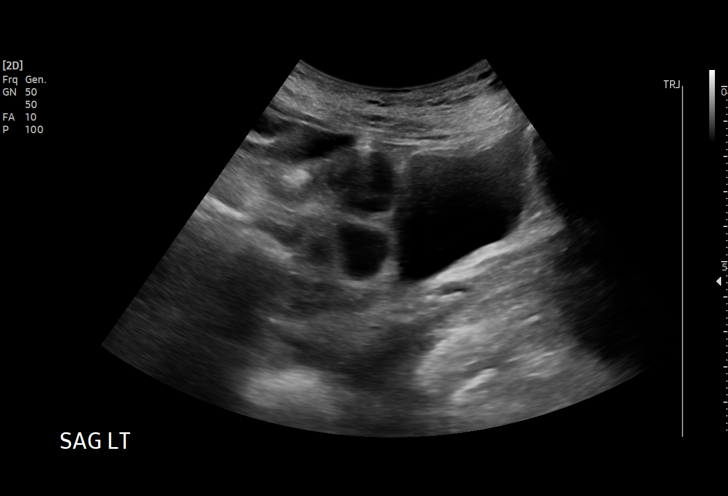
[im 14/47]
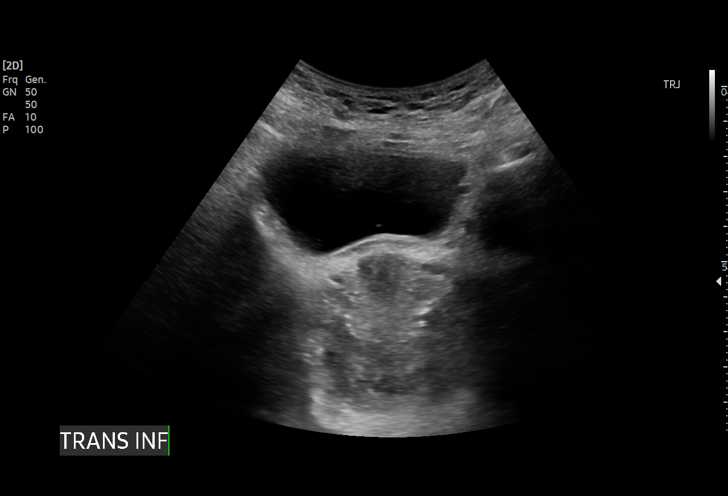
[im 18/47]
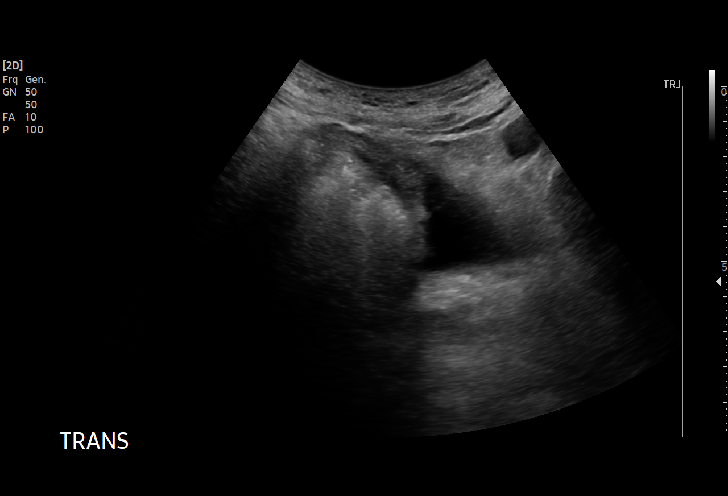
[im 20/47]
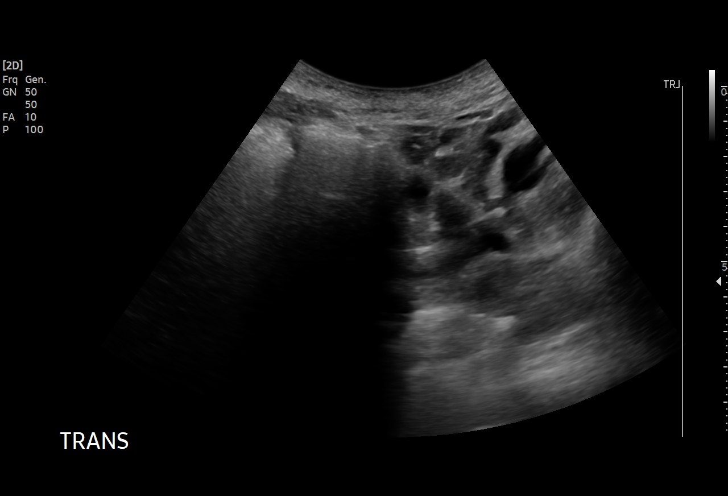
[im 24/47]
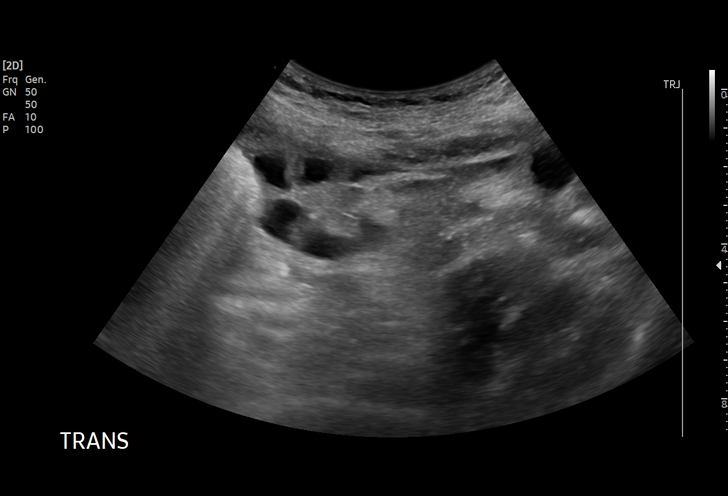
[im 27/47]
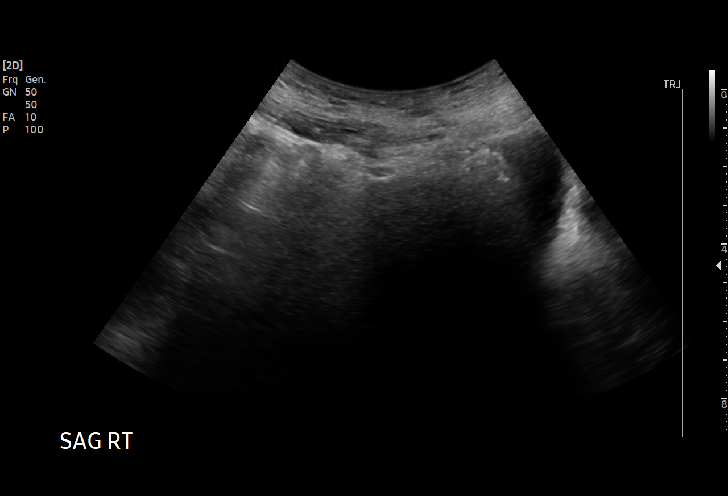
[im 29/47]
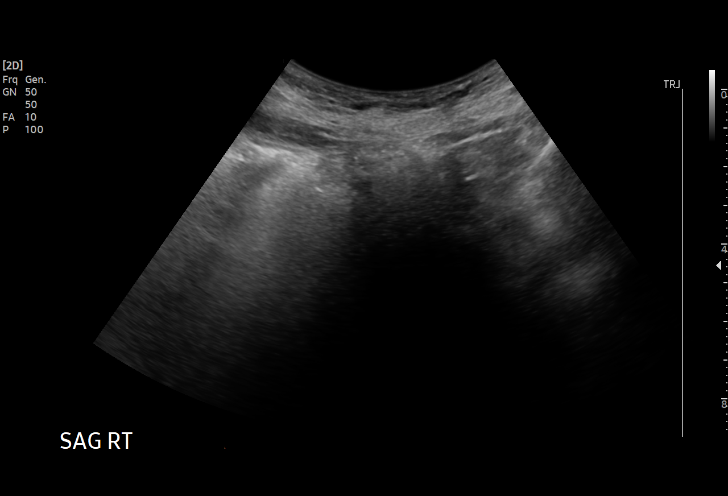
[im 33/47]
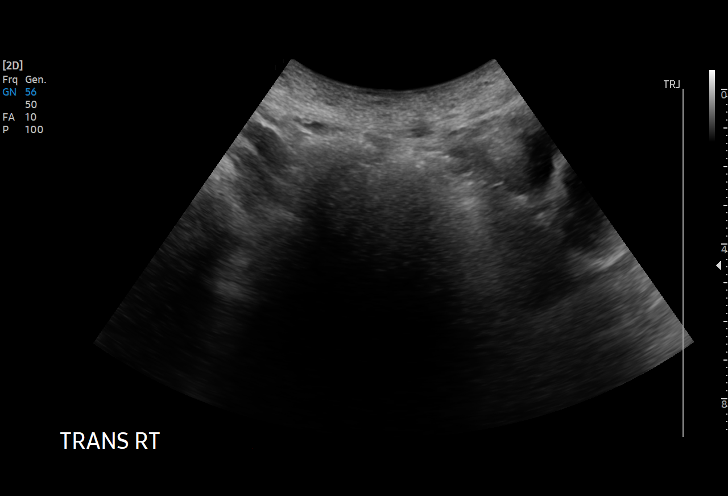
[im 37/47]
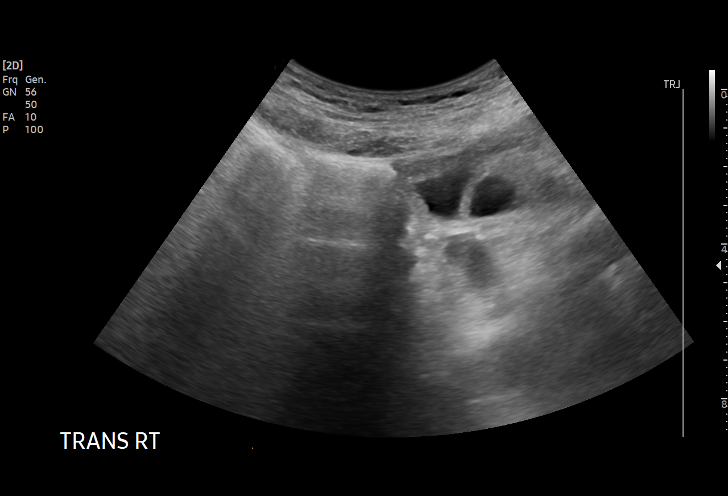
[im 39/47]
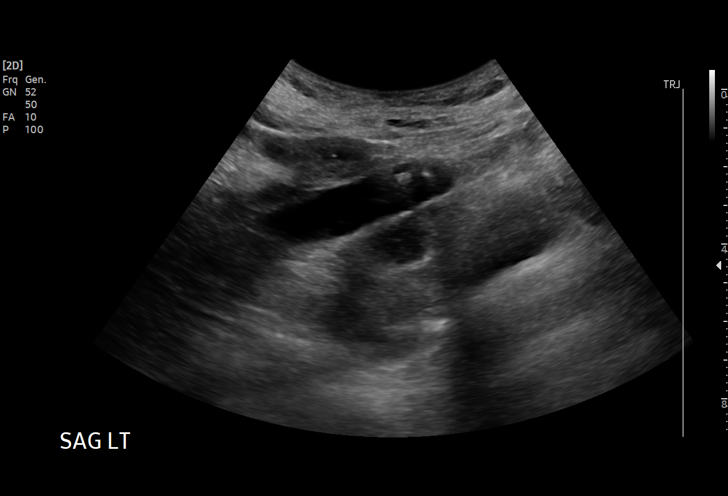
[im 43/47]
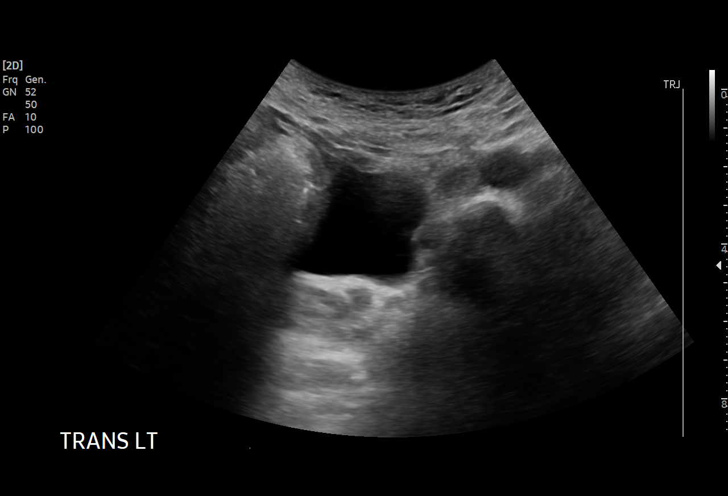
[im 47/47]
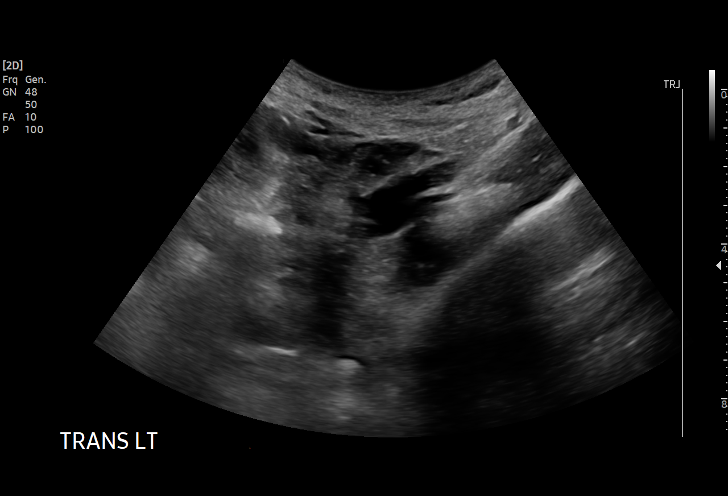

[15 of 25 positions shown; findings below may reference images not displayed]

FINDINGS: Uterus

Status post hysterectomy

Endometrium

Status post hysterectomy

Right ovary

Not seen.  Adnexa obscured by bowel gas

Left ovary

Not seen.

Other findings:  No abnormal free fluid.
IMPRESSION: 1. Status post hysterectomy.
2. Nonvisualized ovaries

## 2023-08-07 ENCOUNTER — Emergency Department (HOSPITAL_COMMUNITY)

## 2023-08-07 ENCOUNTER — Other Ambulatory Visit: Payer: Self-pay

## 2023-08-07 ENCOUNTER — Emergency Department (HOSPITAL_COMMUNITY)
Admission: EM | Admit: 2023-08-07 | Discharge: 2023-08-07 | Disposition: A | Discharge status: Home / Self Care | Attending: Emergency Medicine | Admitting: Emergency Medicine

## 2023-08-07 DIAGNOSIS — R404 Transient alteration of awareness: Secondary | ICD-10-CM | POA: Diagnosis not present

## 2023-08-07 DIAGNOSIS — R55 Syncope and collapse: Secondary | ICD-10-CM | POA: Diagnosis not present

## 2023-08-07 DIAGNOSIS — R4182 Altered mental status, unspecified: Secondary | ICD-10-CM | POA: Diagnosis not present

## 2023-08-07 DIAGNOSIS — I959 Hypotension, unspecified: Secondary | ICD-10-CM | POA: Diagnosis not present

## 2023-08-07 LAB — CBC WITH DIFFERENTIAL/PLATELET
Abs Immature Granulocytes: 0.04 10*3/uL (ref 0.00–0.07)
Basophils Absolute: 0 10*3/uL (ref 0.0–0.1)
Basophils Relative: 0 %
Eosinophils Absolute: 0.3 10*3/uL (ref 0.0–0.5)
Eosinophils Relative: 3 %
HCT: 44.3 % (ref 36.0–46.0)
Hemoglobin: 14.6 g/dL (ref 12.0–15.0)
Immature Granulocytes: 0 %
Lymphocytes Relative: 29 %
Lymphs Abs: 3.4 10*3/uL (ref 0.7–4.0)
MCH: 31.5 pg (ref 26.0–34.0)
MCHC: 33 g/dL (ref 30.0–36.0)
MCV: 95.5 fL (ref 80.0–100.0)
Monocytes Absolute: 1 10*3/uL (ref 0.1–1.0)
Monocytes Relative: 8 %
Neutro Abs: 6.8 10*3/uL (ref 1.7–7.7)
Neutrophils Relative %: 60 %
Platelets: 237 10*3/uL (ref 150–400)
RBC: 4.64 MIL/uL (ref 3.87–5.11)
RDW: 12.7 % (ref 11.5–15.5)
WBC: 11.5 10*3/uL — ABNORMAL HIGH (ref 4.0–10.5)
nRBC: 0 % (ref 0.0–0.2)

## 2023-08-07 LAB — COMPREHENSIVE METABOLIC PANEL WITH GFR
ALT: 12 U/L (ref 0–44)
AST: 17 U/L (ref 15–41)
Albumin: 3.7 g/dL (ref 3.5–5.0)
Alkaline Phosphatase: 55 U/L (ref 38–126)
Anion gap: 8 (ref 5–15)
BUN: 7 mg/dL (ref 6–20)
CO2: 23 mmol/L (ref 22–32)
Calcium: 8.8 mg/dL — ABNORMAL LOW (ref 8.9–10.3)
Chloride: 105 mmol/L (ref 98–111)
Creatinine, Ser: 0.96 mg/dL (ref 0.44–1.00)
GFR, Estimated: >60 mL/min (ref >60)
Glucose, Bld: 123 mg/dL — ABNORMAL HIGH (ref 70–99)
Potassium: 3.1 mmol/L — ABNORMAL LOW (ref 3.5–5.1)
Sodium: 136 mmol/L (ref 135–145)
Total Bilirubin: 0.7 mg/dL (ref 0.0–1.2)
Total Protein: 6.5 g/dL (ref 6.5–8.1)

## 2023-08-07 LAB — BLOOD GAS, VENOUS
Acid-base deficit: 1.8 mmol/L (ref 0.0–2.0)
Bicarbonate: 24.3 mmol/L (ref 20.0–28.0)
O2 Saturation: 39.9 %
Patient temperature: 37
pCO2, Ven: 45 mmHg (ref 44–60)
pH, Ven: 7.34 (ref 7.25–7.43)
pO2, Ven: <31 mmHg — CL (ref 32–45)

## 2023-08-07 LAB — RAPID URINE DRUG SCREEN, HOSP PERFORMED
Amphetamines: NOT DETECTED
Barbiturates: NOT DETECTED
Benzodiazepines: NOT DETECTED
Cocaine: NOT DETECTED
Opiates: NOT DETECTED
Tetrahydrocannabinol: POSITIVE — AB

## 2023-08-07 LAB — ETHANOL: Alcohol, Ethyl (B): <15 mg/dL (ref <15)

## 2023-08-07 LAB — CBG MONITORING, ED: Glucose-Capillary: 107 mg/dL — ABNORMAL HIGH (ref 70–99)

## 2023-08-07 LAB — SALICYLATE LEVEL: Salicylate Lvl: <7 mg/dL — ABNORMAL LOW (ref 7.0–30.0)

## 2023-08-07 LAB — ACETAMINOPHEN LEVEL: Acetaminophen (Tylenol), Serum: <10 ug/mL — ABNORMAL LOW (ref 10–30)

## 2023-08-07 MED ORDER — POTASSIUM CHLORIDE CRYS ER 20 MEQ PO TBCR
40.0000 meq | EXTENDED_RELEASE_TABLET | Freq: Once | ORAL | Status: AC
  Administered 2023-08-07: 40 meq via ORAL
  Filled 2023-08-07: qty 2

## 2023-08-07 MED ORDER — LACTATED RINGERS IV BOLUS
1000.0000 mL | Freq: Once | INTRAVENOUS | Status: AC
  Administered 2023-08-07: 1000 mL via INTRAVENOUS

## 2023-08-07 NOTE — ED Notes (Signed)
Pt ambulated to restroom with little assistance 

## 2023-08-07 NOTE — ED Provider Notes (Signed)
 [facility] Emergency Department Provider Note MRN:  [id number]  Arrival date & time: 08/07/23     Chief Complaint   Loss of Consciousness   History of Present Illness   Diane Garrison is a 38 y.o. year-old female presents to the ED with chief complaint of syncope.  Was BIB EMS after syncopal event and was found to be hypotensive and altered level of consciousness. GCS of 6.  Patient reportedly gave plasma this evening and was walking home when the symptoms started.  She is unable to provide any history on exam.    History provided by EMS.   Review of Systems  Pertinent positive and negative review of systems noted in HPI.    Physical Exam   Vitals:   08/07/23 0330 08/07/23 0400  BP: 118/79 113/73  Pulse:  (!) 59  Resp: 14 13  Temp:    SpO2:  99%    CONSTITUTIONAL:  non toxic-appearing, NAD NEURO:  GCS 6, unresponsive EYES:  eyes equal and reactive ENT/NECK:  Supple, no stridor  CARDIO:  normal rate, regular rhythm, appears well-perfused  PULM:  No respiratory distress, CTAB GI/GU:  non-distended, no guarding MSK/SPINE:  No gross deformities, no edema, moves all extremities  SKIN:  no rash, atraumatic   *Additional and/or pertinent findings included in MDM below  Diagnostic and Interventional Summary    EKG Interpretation Date/Time:  Saturday August 07 2023 02:57:17 EDT Ventricular Rate:  61 PR Interval:  166 QRS Duration:  91 QT Interval:  403 QTC Calculation: 406 R Axis:   82  Text Interpretation: Sinus rhythm Nonspecific T abnrm, anterolateral leads ST elev, probable normal early repol pattern No significant change was found Confirmed by Townsend Freud [phone]) on 08/07/2023 3:33:33 AM       Labs Reviewed  COMPREHENSIVE METABOLIC PANEL WITH GFR - Abnormal; Notable for the following components:      Result Value   Potassium 3.1 (*)    Glucose, Bld 123 (*)    Calcium 8.8 (*)    All other components within normal limits   SALICYLATE LEVEL - Abnormal; Notable for the following components:   Salicylate Lvl <7.0 (*)    All other components within normal limits  ACETAMINOPHEN  LEVEL - Abnormal; Notable for the following components:   Acetaminophen  (Tylenol ), Serum <10 (*)    All other components within normal limits  CBC WITH DIFFERENTIAL/PLATELET - Abnormal; Notable for the following components:   WBC 11.5 (*)    All other components within normal limits  BLOOD GAS, VENOUS - Abnormal; Notable for the following components:   pO2, Ven <31 (*)    All other components within normal limits  CBG MONITORING, ED - Abnormal; Notable for the following components:   Glucose-Capillary 107 (*)    All other components within normal limits  ETHANOL  RAPID URINE DRUG SCREEN, HOSP PERFORMED    CT HEAD WO CONTRAST ( )  Final Result    DG Chest Port 1 View  Final Result      Medications  lactated ringers  bolus 1,000 mL (0 mLs Intravenous Stopped 08/07/23 0230)  potassium chloride SA (KLOR-CON M) CR tablet 40 mEq (40 mEq Oral Given 08/07/23 0444)     Procedures  /  Critical Care Procedures  ED Course and Medical Decision Making  I have reviewed the triage vital signs, the nursing notes, and pertinent available records from the EMR.  Social Determinants Affecting Complexity of Care: Patient has no clinically significant social determinants  affecting this chief complaint..   ED Course:    Medical Decision Making Patient here after syncopal event.  She came in with GCS of 6 and was initially hypotensive with EMS.  She has just given plasma and was walking home.  On arrival she had faint withdrawal from pain (sternal rub), but jerked her head away when I opened her eyes to exam her pupils.    Will check labs and imaging.  Will reassess.  CT head is negative.  CXR reassuring.  Labs notable for mild hypokalemia, which was treated in the ED.    Patient given fluids and monitored for several hours.  On  reassessment, patient states that she is feeling much better and is ready to go home.  She is no fully alert and oriented and not complaining of any symptoms.  Amount and/or Complexity of Data Reviewed Labs: ordered. Radiology: ordered. ECG/medicine tests: ordered.  Risk Prescription drug management.         Consultants: No consultations were needed in caring for this patient.   Treatment and Plan: I considered admission due to patient's initial presentation, but after considering the examination and diagnostic results, patient will not require admission and can be discharged with outpatient follow-up.    Final Clinical Impressions(s) / ED Diagnoses     ICD-10-CM   1. Syncope and collapse  R55       ED Discharge Orders     None         Discharge Instructions Discussed with and Provided to Patient:   Discharge Instructions      Your tests all looked good.  Please follow-up with your regular doctor.  Please stay hydrated.  Return for new or worsening symptoms.      Sherel Dikes, PA-C 08/07/23 0518    Lindle Rhea, MD 08/07/23 [phone]

## 2023-08-07 NOTE — Discharge Instructions (Signed)
 Your tests all looked good.  Please follow-up with your regular doctor.  Please stay hydrated.  Return for new or worsening symptoms.

## 2023-08-07 NOTE — ED Triage Notes (Signed)
 Pt was walking home from donating plasma earlier tonight and was witnessed to have a syncopal spell. EMS reported that pt was very slow to respond, but was responsive to voice. CBG was 102

## 2023-08-07 NOTE — ED Notes (Signed)
 Patient ambulated to restroom with minimal assistance. Patient did not express any signs of discomfort.

## 2023-08-13 DIAGNOSIS — R109 Unspecified abdominal pain: Secondary | ICD-10-CM | POA: Diagnosis not present

## 2023-08-13 DIAGNOSIS — E876 Hypokalemia: Secondary | ICD-10-CM | POA: Diagnosis not present

## 2023-08-13 DIAGNOSIS — S060X1A Concussion with loss of consciousness of 30 minutes or less, initial encounter: Secondary | ICD-10-CM | POA: Diagnosis not present

## 2023-08-13 DIAGNOSIS — G8929 Other chronic pain: Secondary | ICD-10-CM | POA: Diagnosis not present

## 2023-08-13 DIAGNOSIS — Z87891 Personal history of nicotine dependence: Secondary | ICD-10-CM | POA: Diagnosis not present

## 2023-10-08 DIAGNOSIS — K602 Anal fissure, unspecified: Secondary | ICD-10-CM | POA: Diagnosis not present

## 2023-12-10 DIAGNOSIS — G43839 Menstrual migraine, intractable, without status migrainosus: Secondary | ICD-10-CM | POA: Diagnosis not present

## 2023-12-10 DIAGNOSIS — M5442 Lumbago with sciatica, left side: Secondary | ICD-10-CM | POA: Diagnosis not present

## 2023-12-10 DIAGNOSIS — J4540 Moderate persistent asthma, uncomplicated: Secondary | ICD-10-CM | POA: Diagnosis not present

## 2024-01-13 DIAGNOSIS — L700 Acne vulgaris: Secondary | ICD-10-CM | POA: Diagnosis not present

## 2024-01-13 DIAGNOSIS — R110 Nausea: Secondary | ICD-10-CM | POA: Diagnosis not present

## 2024-01-13 DIAGNOSIS — L089 Local infection of the skin and subcutaneous tissue, unspecified: Secondary | ICD-10-CM | POA: Diagnosis not present

## 2024-01-13 DIAGNOSIS — L732 Hidradenitis suppurativa: Secondary | ICD-10-CM | POA: Diagnosis not present

## 2024-01-13 DIAGNOSIS — Z79899 Other long term (current) drug therapy: Secondary | ICD-10-CM | POA: Diagnosis not present
